# Patient Record
Sex: Male | Born: 1945 | Race: White | Hispanic: No | Marital: Single | Smoking: Never smoker
Health system: Southern US, Community
[De-identification: ages and names within clinical notes are randomized; demographics above are authoritative.]

## PROBLEM LIST (undated history)

## (undated) DIAGNOSIS — E785 Hyperlipidemia, unspecified: Secondary | ICD-10-CM

## (undated) DIAGNOSIS — G629 Polyneuropathy, unspecified: Secondary | ICD-10-CM

## (undated) DIAGNOSIS — G4730 Sleep apnea, unspecified: Secondary | ICD-10-CM

## (undated) DIAGNOSIS — N189 Chronic kidney disease, unspecified: Secondary | ICD-10-CM

## (undated) DIAGNOSIS — G5600 Carpal tunnel syndrome, unspecified upper limb: Secondary | ICD-10-CM

## (undated) DIAGNOSIS — E119 Type 2 diabetes mellitus without complications: Secondary | ICD-10-CM

## (undated) DIAGNOSIS — I639 Cerebral infarction, unspecified: Secondary | ICD-10-CM

## (undated) DIAGNOSIS — M519 Unspecified thoracic, thoracolumbar and lumbosacral intervertebral disc disorder: Secondary | ICD-10-CM

## (undated) DIAGNOSIS — F32A Depression, unspecified: Secondary | ICD-10-CM

## (undated) DIAGNOSIS — I10 Essential (primary) hypertension: Secondary | ICD-10-CM

## (undated) DIAGNOSIS — R609 Edema, unspecified: Secondary | ICD-10-CM

## (undated) DIAGNOSIS — N529 Male erectile dysfunction, unspecified: Secondary | ICD-10-CM

## (undated) DIAGNOSIS — F329 Major depressive disorder, single episode, unspecified: Secondary | ICD-10-CM

## (undated) HISTORY — DX: Hyperlipidemia, unspecified: E78.5

## (undated) HISTORY — DX: Type 2 diabetes mellitus without complications: E11.9

## (undated) HISTORY — DX: Male erectile dysfunction, unspecified: N52.9

## (undated) HISTORY — DX: Edema, unspecified: R60.9

## (undated) HISTORY — DX: Polyneuropathy, unspecified: G62.9

## (undated) HISTORY — DX: Unspecified thoracic, thoracolumbar and lumbosacral intervertebral disc disorder: M51.9

## (undated) HISTORY — DX: Depression, unspecified: F32.A

## (undated) HISTORY — DX: Carpal tunnel syndrome, unspecified upper limb: G56.00

## (undated) HISTORY — DX: Major depressive disorder, single episode, unspecified: F32.9

## (undated) HISTORY — PX: BACK SURGERY: SHX140

## (undated) HISTORY — DX: Chronic kidney disease, unspecified: N18.9

## (undated) HISTORY — PX: VASECTOMY: SHX75

## (undated) HISTORY — DX: Essential (primary) hypertension: I10

## (undated) HISTORY — DX: Sleep apnea, unspecified: G47.30

---

## 1999-04-23 ENCOUNTER — Inpatient Hospital Stay (HOSPITAL_COMMUNITY): Admission: EM | Admit: 1999-04-23 | Discharge: 1999-04-24 | Payer: Self-pay | Admitting: Emergency Medicine

## 1999-04-23 ENCOUNTER — Encounter: Payer: Self-pay | Admitting: Emergency Medicine

## 1999-04-24 ENCOUNTER — Encounter: Payer: Self-pay | Admitting: *Deleted

## 1999-05-03 ENCOUNTER — Ambulatory Visit (HOSPITAL_COMMUNITY): Admission: RE | Admit: 1999-05-03 | Discharge: 1999-05-03 | Payer: Self-pay | Admitting: *Deleted

## 1999-10-12 ENCOUNTER — Emergency Department (HOSPITAL_COMMUNITY): Admission: EM | Admit: 1999-10-12 | Discharge: 1999-10-12 | Payer: Self-pay | Admitting: Emergency Medicine

## 2002-10-07 ENCOUNTER — Encounter: Payer: Self-pay | Admitting: Specialist

## 2002-10-09 ENCOUNTER — Inpatient Hospital Stay (HOSPITAL_COMMUNITY): Admission: RE | Admit: 2002-10-09 | Discharge: 2002-10-16 | Payer: Self-pay | Admitting: Specialist

## 2002-10-09 ENCOUNTER — Encounter: Payer: Self-pay | Admitting: Orthopedic Surgery

## 2002-10-09 ENCOUNTER — Encounter: Payer: Self-pay | Admitting: Specialist

## 2002-10-15 ENCOUNTER — Encounter: Payer: Self-pay | Admitting: Specialist

## 2003-01-13 ENCOUNTER — Encounter
Admission: RE | Admit: 2003-01-13 | Discharge: 2003-04-13 | Payer: Self-pay | Admitting: Physical Medicine and Rehabilitation

## 2003-02-28 HISTORY — PX: BACK SURGERY: SHX140

## 2004-03-05 ENCOUNTER — Emergency Department (HOSPITAL_COMMUNITY): Admission: EM | Admit: 2004-03-05 | Discharge: 2004-03-05 | Payer: Self-pay | Admitting: Emergency Medicine

## 2004-03-08 ENCOUNTER — Encounter: Admission: RE | Admit: 2004-03-08 | Discharge: 2004-03-08 | Payer: Self-pay | Admitting: Specialist

## 2004-04-29 ENCOUNTER — Ambulatory Visit (HOSPITAL_COMMUNITY): Admission: RE | Admit: 2004-04-29 | Discharge: 2004-04-29 | Payer: Self-pay | Admitting: Neurology

## 2004-07-14 ENCOUNTER — Ambulatory Visit: Payer: Self-pay | Admitting: Cardiovascular Disease

## 2004-07-21 ENCOUNTER — Ambulatory Visit: Payer: Self-pay

## 2004-08-17 ENCOUNTER — Ambulatory Visit (HOSPITAL_COMMUNITY): Admission: RE | Admit: 2004-08-17 | Discharge: 2004-08-17 | Payer: Self-pay | Admitting: Internal Medicine

## 2004-08-17 ENCOUNTER — Ambulatory Visit: Payer: Self-pay | Admitting: Internal Medicine

## 2006-06-07 ENCOUNTER — Ambulatory Visit: Payer: Self-pay | Admitting: Pulmonary Disease

## 2006-07-09 ENCOUNTER — Ambulatory Visit (HOSPITAL_BASED_OUTPATIENT_CLINIC_OR_DEPARTMENT_OTHER): Admission: RE | Admit: 2006-07-09 | Discharge: 2006-07-09 | Payer: Self-pay | Admitting: Pulmonary Disease

## 2006-07-17 ENCOUNTER — Ambulatory Visit: Payer: Self-pay | Admitting: Pulmonary Disease

## 2006-07-25 ENCOUNTER — Ambulatory Visit: Payer: Self-pay | Admitting: Pulmonary Disease

## 2006-08-24 ENCOUNTER — Ambulatory Visit: Payer: Self-pay | Admitting: Pulmonary Disease

## 2007-10-07 ENCOUNTER — Ambulatory Visit: Payer: Self-pay

## 2008-08-11 ENCOUNTER — Encounter: Admission: RE | Admit: 2008-08-11 | Discharge: 2008-08-11 | Payer: Self-pay | Admitting: Specialist

## 2010-07-12 NOTE — Assessment & Plan Note (Signed)
[facility]                             PULMONARY OFFICE NOTE   NAME:Paul Adkins, Paul Adkins                        MRN:          [id number]  DATE:07/25/2006                            DOB:          September 14, 1945    SUBJECTIVE:  Paul Adkins comes in today for follow up after his recent  sleep study. He was found to have a respiratory disturbance index of 38  events per hour and an O2 saturation as low as 77%. He did have  difficulty with sleeping during the study at only 2.7 hours and  therefore I suspect that the severity of his sleep apnea is worse than  what is indicated. I have discussed the study at great length with him  and have answered all of his questions.   PHYSICAL EXAMINATION:  GENERAL:  He is an overweight male in no acute  distress.  VITAL SIGNS:  Blood pressure 112/68, pulse 56, temperature 98, weight  226 pounds, O2 saturation on room air is 97%.   IMPRESSION:  1. Severe obstructive sleep apnea/hypopnea syndrome documented by      nocturnal polysomnography. I have discussed the various options      with him including weight loss, oral appliance, surgery, as well as      CPAP. Given this degree of sleep apnea, I really think CPAP coupled      with weight loss would be in his best interested. I have re-      discussed with him all of the cardiovascular benefits, as well as      the benefits of improved glucose control with treatment of sleep      apnea. The patient is willing to give CPAP a try.   PLAN:  1. Initiate CPAP starting at 10 cm with heated humidification.  2. Work on weight loss.  3. The patient will follow up in 4 weeks or sooner if there are      problems. We will work on pressure optimization at that time.     Barbaraann Share, MD,FCCP  Electronically Signed    [facility]  DD: 07/25/2006  DT: 07/26/2006  Job #: [id number]   cc:   Reather Littler, M.D.

## 2010-07-12 NOTE — Procedures (Signed)
NAMEJuel Adkins, Paul Adkins               ACCOUNT NO.:  [id number]   MEDICAL RECORD NO.:  [id number]          PATIENT TYPE:  OUT   LOCATION:  SLEEP CENTER                 FACILITY:  [facility]   PHYSICIAN:  Barbaraann Share, MD,FCCPDATE OF BIRTH:  04-02-47   DATE OF STUDY:  07/09/2006                            NOCTURNAL POLYSOMNOGRAM   REFERRING PHYSICIAN:   REFERRING PHYSICIAN:  Dr.  Marcelyn Bruins.   LOCATION:  Sleep lab.   INDICATION FOR STUDY:  Hypersomnia with sleep apnea.   EPWORTH SLEEPINESS SCORE:  12   MEDICATIONS:   SLEEP ARCHITECTURE:  The patient had a total sleep time of only 160  minutes with no slow wave sleep and only 10 minutes of REM.  Sleep onset  latency was prolonged at 77 minutes, and REM onset was prolonged as well  with 113 minutes.  Sleep efficiency was very decreased at 39%.   RESPIRATORY DATA:  The patient was found to have 64 hypopneas, 36  obstructive apneas and 2 central apneas for apnea hypopnea index of 38  events per hour.  The events occurred in all body positions and there  was moderate snoring noted throughout.   OXYGEN DATA:  His O2 saturation is as low as 77% with the patient's  obstructive events.   CARDIAC DATA:  No clinically significant cardiac arrhythmias were noted.   MOVEMENT-PARASOMNIA:  The patient was found to have 89 leg jerks with  less than 1 per hour resulting in arousing or awakening.   IMPRESSIONS-RECOMMENDATIONS:  Moderate to severe obstructive sleep  apnea/hypopnea syndrome with an apnea/hypopnea index of 38 events per  hour and O2 desaturation as low as 77%.  Treatment for this degree of  sleep apnea should primarily focus on weight loss if applicable as well  as CPAP.      Barbaraann Share, MD,FCCP  Diplomate, American Board of Sleep  Medicine  Electronically Signed     [facility]  D:  07/19/2006 08:25:44  T:  07/19/2006 09:01:08  Job:  [id number]

## 2010-07-15 NOTE — Discharge Summary (Signed)
Paul Adkins, Paul Adkins                           ACCOUNT NO.:  [id number]   MEDICAL RECORD NO.:  [id number]                   PATIENT TYPE:  INP   LOCATION:  5008                                 FACILITY:  [facility]   PHYSICIAN:  Kerrin Champagne, M.D.                DATE OF BIRTH:  December 27, 1945   DATE OF ADMISSION:  10/09/2002  DATE OF DISCHARGE:  10/16/2002                                 DISCHARGE SUMMARY   ADMISSION DIAGNOSES:  1. Spondylolisthesis L5-S1, isthmic type with bilateral pars defect,     bilateral foraminal entrapment of L5 nerve root, degenerative disk     disease L4-5.  2. Insulin-dependent diabetes mellitus utilizing insulin pump.  3. Hypertension.  4. Hyperlipidemia.  5. Depression and anxiety, mild, treated with antidepressants.   DISCHARGE DIAGNOSES:  1. Spondylolisthesis L5-S1, isthmic type, with bilateral  pars defect,     bilateral foraminal entrapment of L5 nerve root, degenerative disk     disease L4-5.  2. Small dural bleb tear, right L5 nerve root, treated intraoperatively with     suture and fibrin glue.  3. Insulin-dependent diabetes mellitus treated with insulin pump.  4. Hypertension.  5. Hyperlipidemia.  6. Depression and anxiety, mild, treated with antidepressants.  7. Postoperative ileus, cleared at discharge.  8. Posthemorrhagic anemia requiring blood transfusions.  9. Hypocalcemia, resolved at discharge.  10.      Hypokalemia, resolved at discharge.  11.      Preoperative chest x-ray with nodule noted, requiring CT scan of     the chest.  CT scan negative for mass or consolidation.   PROCEDURES:  On October 09, 2002, the patient underwent central laminectomy  L5-S1 with bilateral foraminal decompression of the L5 nerve roots, right-  sided L4-5 hemilaminectomy with excision of the spinous process of L4.  Right L4-5 and L5-S1 transforaminal interbody fusion utilizing Brantigan  radiolucent cages.  Posterolateral fusion L4-5 and L5-S1 with  Monarch  pedicle screws and rods, right iliac crest bone graft harvested through  separate fascial incision.  Symphony bone graft utilized with platelet-rich  plasma, repair of dural bleb right L4-5 at the shoulder of the right L-5  nerve root, 2 to 3 mm bleb with three 6-0 Prolene sutures and fibrin glue.  This was performed by Dr. Otelia Sergeant, assisted by Wende Neighbors, P.A.-C. under  general anesthesia .   CONSULTATIONS:  Ricki Rodriguez, M.D.   BRIEF HISTORY:  Paul Adkins is a 65 -year-old white male with insulin-  dependent diabetes mellitus treated and stable with insulin pump for a  number of years.  The patient had notably increasing neurogenic claudication  over the past one year.  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  The patient has failed  conservative management with epidural steroid injections as well as rest.  He is requiring  narcotic medications on a regular basis at this time.  Studies have indicated the patient has degenerative disk disease at L4-5  with grade I slip at the L5-S1 level as well as bilateral foraminal  entrapment of the L5 nerve roots.  It was felt he would require surgical  intervention for definitive treatment of his finding on MRI.  He was  admitted to undergo procedure as stated above.   HOSPITAL COURSE:  The patient tolerated the procedure under general  anesthesia  without complications.  He did require repair of dural bleb  intraoperatively; however, with Valsalva maneuver, he did not have any  leaking about the dural bleb intraoperatively.  The patient was transferred  to intensive care unit following the procedure to have close medical  observation with his significant past medical history.  Hemoglobin did drop  intraoperatively to 6.8, and he was transfused.  Further drop in hemoglobin  was to 8.1.  He did receive a total of 4 units of packed red blood cells  during the hospital stay.   Last hemoglobin on October 13, 2002, was noted to  be 10.2 with hematocrit 29.4 and stable. Neurovascular and motor functions  of the lower extremities noted to be intact on the first postoperative day.  The patient was placed on the Glucomander while in the unit to manage  diabetes.   Unfortunately on the first postoperative day, the patient began developing  signs and symptoms of postop ileus.  He had abdominal distention as well as  hypoactive bowel sounds and nausea.  Later on the first postoperative day,  he did require placement of an NG tube.  He was also noted to have  significant hypocalcemia and was given 1 amp of calcium gluconate.  The  patient initially was treated with PCA Dilaudid for pain control.  He was  also receiving muscle relaxers and oral analgesics to assist with pain  control.  It did take several days before he was able to be weaned from the  IV medication to oral medication.   Diabetes was monitored by Dr. Algie Coffer.  He gradually was weaned back onto  his insulin pump once he was stabilized.  With pain control, he was able to  start taking a regular diet secondary to his ileus.  It did take some time  to resolve his ileus as he was noted to be hypokalemic as well.  He was  treated with oral supplementation.  Hypocalcemia continued to improve  throughout the hospital stay.   On august 16, he was noted to have had several bowel movements and was  having less abdominal distention as well as positive bowel sounds.  His IV  fluids were discontinue as was his Foley catheter.  Telemetry was  discontinued, and he was transferred to the orthopedic floor at that time.   The patient continued to have some mild difficulty with hypoglycemia and did  require several adjustments in his insulin pump to regain adequate control.  His base rate was changed accordingly by Dr. Algie Coffer.  The patient's diet was slowly restarted as the ileus cleared; however, he continued to have   difficulty with constipation throughout the hospital stay.  Eventually with  a regular regimen of laxatives and stool softeners, he was able to start  having regular bowel movement and was taking his diet a little better.   Physical therapy was started once the patient was transferred out of the  intensive care unit.  While he was in the intensive  care unit, he actually  did begin some physical therapy with bed-to-chair transfers as well as  donning and doffing his brace.  Once he was more stable, he began ambulating  in the hallway.  His neurovascular and motor functions remained intact in  the lower extremities throughout the hospital stay.  The patient significant  relief of his leg pain after the procedure; however, he continued to have  low back pain as expected.  Oral medications were eventually changed to  OxyContin and OxyIR, and this did give him adequate pain control.   It was noted that his preop chest x-ray did find a possible nodule, and a  followup CT was recommended.  The CT scan was performed, and no mass or  consolidation was noted.  Atelectasis was noted on his CT scan.  The patient  had been treated with pulmonary toilet throughout the hospital stay.  The  patient's Hemovac drain was discontinued on the second postoperative day  from his wound, and dressing changes were done daily thereafter.  He did  have good wound healing throughout the hospital stay without any signs or  symptoms of infection.   On October 16, 2002, the patient was felt to be stable for discharge to his  home with continued care by his family.  Arrangements were also made for  home health physical therapy, occupational therapy, and bath aide.   CONDITION ON DISCHARGE:  Stable.   LABORATORY DATA:  Pertinent laboratory values, admission CBC with hemoglobin  12.5, hematocrit 35.8.  Hemoglobin dropped to 6.6 on the first postoperative  day, and patient received 2 units of packed red blood cells.  The  values did  increase to above 9.0 on hemoglobin and 27 on hematocrit.  The patient had  another drop in hemoglobin on August 15 to 6.8 at which time he did receive  another 2 units of packed red blood cells, and his hemoglobin and hematocrit  stabilized at 10.2 and 29.4.   Chemistry studies on admission were  within normal limits.  Postoperatively,  chemistry studies were monitored daily.  He did have two days of hypokalemia  which was treated with oral supplementation and corrected to a normal value.  The patient was noted to have hypokalemia. After treatment with IV calcium  gluconate, the patient was noted to have increase in his values which  contained to increase daily.  Glucose values were noted to be abnormal on  his routine chemistry studies; however, they were monitored closely at the  bedside by CBG values.  While on the Glucomander, he had excellent control. However, once trying to wean back onto the insulin pump, he had some  difficulty maintaining normal values.  Adjustments were made per Dr.  Roseanne Kaufman instructions, and prior to discharge, blood sugars has stabilized.  Urinalysis on admission was negative for urinary tract infection.  Blood  gases were check on august 12, and initial values PO2 249, and a repeat  later that day showed PO2 94, bicarb 18, pH 7.235.   CT of the abdomen was performed on October 09, 2002, as the patient had a  significant drop in his hemoglobin, and retroperitoneal bleed needed to be  ruled out.  Impression of the CT scan was that of postoperative changes of  the lumbar spine without evidence or retroperitoneal hemorrhage.   CT of the chest was with bibasilar atelectasis.   EKG on admission: Normal sinus rhythm confirmed by Dr. Daleen Squibb.  A repeat on  august 12 showed  normal sinus rhythm confirmed by Dr. [place].   PLAN:  The patient was discharged to his home with arrangements to have  assistance by home health care by [place].  He will received  physical  therapy as well as occupational therapy for ambulation and gait training.  He will continue to wear his brace when out of bed but is not required to  wear the brace while at rest.  He will avoid lifting over 10 pounds and  avoid bending or twisting at the waist.  He will begin a walking program and  try to walk as much as one mile per day.  The patient will change his  dressing daily at home.  He will be allowed to shower at the time of  discharge.  The patient will call Dr. Barbaraann Faster office to arrange an  appointment for two weeks postop.   DISCHARGE MEDICATIONS:  Prescriptions given to him include:  1. Robaxin 500 mg 1 every 6 to 8 hours as needed for spasm.  2. OxyContin 20 mg 1 p.o. b.i.d.  3. OxyIR 1 every 4 to 6 hours as needed for pain.  4. Reglan 10 mg 1 p.o. daily with meals.  5. The patient will take over-the-counter baby aspirin daily.  6. Multivitamins daily.  7. He is advised to use Senokot over-the-counter as well.  8. He may use laxative or enema of choice as needed.   DIET:  He will continue on a his regular diet for diabetes and is encouraged  to have plenty of liquids.   Patient and family have been advised to call for questions or concerns prior  to his return office visit.   CONDITION ON DISCHARGE:  Stable.     Wende Neighbors, P.A.                    Kerrin Champagne, M.D.    [facility]  D:  12/17/2002  T:  12/17/2002  Job:  [id number]

## 2010-07-15 NOTE — Assessment & Plan Note (Signed)
REASON FOR VISIT:  Paul Adkins has returned to the Pain and Rehabilitative  Medicine clinic.  He is status post a transforaminal inner body fusion done  October 09, 2002 by Dr. Otelia Sergeant.  He initially came to the clinic taking 40 mg  a day of OxyContin.  He had attempted to wean himself and had some  withdrawal symptoms earlier that week.  He was given a prescription for  OxyContin 10 mg b.i.d. and he is back in today saying he has done quite well  on that.  He was also given some medication for breakthrough problems and he  reports he did not need to take any immediate release OxyContin.   Overall he is doing very well.  He is working.  He has had no problems with  any kind of withdrawal symptoms with reduction of his pain medication last  time.  He reports his bowels have improved.  He denies any new problems of  numbness, tingling, weakness, and he denied any new problems with back pain.  He reports he is doing quite well.   On exam he is able to get out of the chair easily.  He walks in the room  without any difficulty.  He is able to walk with a brisk pace, walks on  heels and toes easily.  Neurologic exam is unchanged from last visit.  His  reflexes are symmetric and intact in the lower extremities.  Sensation is  intact.  Motor strength is 5/5 in lower extremities.  Straight leg raise is  negative.   IMPRESSION:  Status post transforaminal inner body fusion August 2004.  The  patient had taken 40 mg of OxyContin a day.  He is down to 20 mg a day.  Today we will give him a prescription for 10 mg per day.  He has not used  any of his immediate release OxyContin which he can use as needed one to two  per day.  However, I anticipate he probably will not need this at all.  We  will see him back in two weeks.  He will call if there is any trouble and  anticipate he will be off all of his OxyContin at the next visit.  Will see  him back in two weeks.      Brantley Stage,  M.D.   [facility]  D:  01/28/2003 12:57:07  T:  01/28/2003 13:42:19  Job #:  [id number]   cc:   Kerrin Champagne, M.D.  [address]  [place]  [place] [address]  Fax: [phone]

## 2010-07-15 NOTE — Discharge Summary (Signed)
[place]  Patient:    Paul Adkins, Paul Adkins                        MRN: [id number] Adm. Date:  [id number] Disc. Date: 04/24/99 Attending:  Veneda Melter Dictator:   Leonides Cave, P.A. CC:         Veneda Melter, M.D., Cardiology             Reather Littler, M.D., Endocrinology                           Discharge Summary  DISCHARGE DIAGNOSES: 1. Syncope, unclear etiology.  Patient with negative GXD Cardiolite and    negative cardiac enzymes. 2. Insulin-dependent diabetes x 25 years. 3. 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 4. Anxiety.  Patient started on Buspar within the last month.  BRIEF HISTORY:  [name] without history of any coronary artery disease.   Patient has a fairly benign past medical history other than insulin-dependent diabetes mellitus x 25 years and as stated above. Was eating lunch around 12:45 on the day of admission with his daughter when he developed left upper extremity "numbness" and neck discomfort.  Symptoms lasted a little over a minute, then he had a frank syncopal episode.  No one was able to  give a duration of his time of loss of consciousness.  When the EMS arrived, patient was pale and had difficulty speaking, and had a generally clouded sensorium.  Patients blood pressure was 70/40 per EMS, and his blood glucose was in the "90s".   Patient denies chest pain or shortness of breath whatsoever. He did have a similar episode of the left upper extremity and neck numbness three r four days prior to this admission while in [place] on business.  Patient states his symptoms resolved shortly after a minute or two in [place].  Patient was sent emergently to CAT scan when he arrived to the emergency department and CAT was negative for aortic dissection.   When seen by the cardiology team in the emergency department, patient was asymptomatic with systolic blood pressure in the  140s and heart rate in the 90s.   Patient was admitted by Leonides Cave, P.A. and Dr. Veneda Melter to rule out for myocardial infarction and to assess causes of syncope.  HOSPITAL COURSE:  Patient had negative cardiac enzymes and was set up for a GXD  Cardiolite on 04/24/99.   Cardiolite results were EF 64%, no ischemia, and normal wall function.   The patient had no arrhythmia on the monitor and it was felt that patient could be discharged home in stable condition.  He was told he can continue his home medications, which include his insulin, which he gets through his insulin pump and Buspar 15 mg b.i.d.   Patient states that he has declined taking the Buspar since that is a recent new medication that was introduced to him and I told him that this was reasonable.  His activity will be as tolerated and he will have some driving restrictions per Dr. Chales Abrahams.  He was to continue a strict diabetic iet and he was told that next week in the [facility] cardiology office, he will need a  carotid ultrasound and a two-week Holter monitor.  He will see Dr. Veneda Melter n follow-up after the above is done.  He will call Monday to make this  appointment. He can follow up with his primary care physician, Dr. Lucianne Muss, on a p.r.n. basis. Of note, he is scheduled to have an appointment with Dr. Lucianne Muss in the morning, 04/25/99. DD:  04/24/99 TD:  04/24/99 Job: [id number]

## 2010-07-15 NOTE — Op Note (Signed)
NAME:  Adkins, Paul                           ACCOUNT NO.:  [id number]   MEDICAL RECORD NO.:  [id number]                   PATIENT TYPE:  INP   LOCATION:  2550                                 FACILITY:  [facility]   PHYSICIAN:  Kerrin Champagne, M.D.                DATE OF BIRTH:  [birth year]   DATE OF PROCEDURE:  10/09/2002  DATE OF DISCHARGE:                                 OPERATIVE REPORT   PREOPERATIVE DIAGNOSIS:  Spondylolisthesis L5-S1, isthmic type, with  bilateral pars defects, bilateral foraminal entrapment of the L5 nerve  roots, degenerative disc disease L4-L5 central disc tear.   POSTOPERATIVE DIAGNOSIS:  Spondylolisthesis L5-S1, isthmic type, with  bilateral pars defects, bilateral foraminal entrapment of the L5 nerve  roots, degenerative disc disease L4-L5 central disc tear.   PROCEDURE:  Central laminectomy L5-S1 with bilateral foraminal decompression  of the L5 nerve roots, right sided L4-L5 hemilaminectomy with excision of  the spinous process of L4.  Right sided L4-L5 and L5-S1 transforaminal  interbody fusion performed using two 10 mm DePuy Brantigan radiolucent  cages.  Posterolateral fusion L4-L5 and L5-S1 with Monarch pedicle screws  and rods, right iliac crest bone graft harvested through a separate fascial  incision, Symphony bone graft with platelet rich plasma.  Repair of dural  bleb right L4-L5 at the shoulder of the right L5 nerve root, 2-3 mm bleb  with three 6-0 Prolene sutures and fibrin glue.   SURGEON:  Kerrin Champagne, M.D.   ASSISTANT:  Wende Neighbors, P.A.   ANESTHESIA:  GOT, Dr. Jean Rosenthal   DRAINS:  Hemovac x 1, Foley catheter to straight drain.   COMPLICATIONS:  Small dural bleb tear right shoulder L5 nerve root at the L4-  L5 level.   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  He  presents with primarily right leg pain greater than left leg pain, pain with  standing and walking.  This is becoming increasingly severe to the point  where he requires narcotic medications.  He has failed conservative  management with epidural steroid injections and rest.  He is brought to the  operating room to undergo decompressive laminectomy for bilateral foraminal  stenosis of L5-S1 associated with ischemic spondylolisthesis which is grade  1 and extension of the fusion to the L4-L5 level because of degenerative  disc changes here with paracentral disc damage or tear.   OPERATIVE FINDINGS:  Degenerative disc changes of L4-L5, grade 1 slip at the  L5-S1 level, bilateral foraminal entrapment of the L5 nerve roots.  The  posterior neural arch of L5 was quite loose and was resected uneventfully.   DESCRIPTION OF PROCEDURE:  After adequate general anesthesia, the patient in  a prone position and chest rolls placed, TED hose to prevent DVT.  Standard  preoperative antibiotics.  All pressure points were well padded.  Loupe  magnification and headlamp was used at the beginning of the procedure.  Standard prep with DuraPrep solution, draped in the usual manner, Ioban  drape used.  The incision in the midline extending from about S2 to L2  through the skin and subcutaneous layers down to the lumbodorsal fascia.  This was incised on both sides of the expected L3, L4, L5, and S1 levels.  Cobb was then used to elevate the paralumbar muscles bilaterally off the  posterior aspect of the lamina of S1, L5, L4, and the L3 level.  Exposure  further obtained using McCullough retractors.  Bleeders controlled using  electrocautery.  Interoperative radiograph with clamps at the spinous  process of L5 and L4 demonstrating these to be the said levels.  Further  exposure then obtained excising the facet capsules  at L4-L5 and L5-S1  carrying the dissection out to the S1 ala bilaterally and to the transverse  process of L4 bilaterally, preserving the capsule at L3-L4 but exposing out  to the transverse process of L4 on both sides.  These were each packed,  bleeders controlled using bipolar and monopolar electrocautery.   With the [facility] retractor inserted, Leksell rongeur used to excise the  central portions of the lamina at the spinous process of L5 and L4.  The  lamina resected bilaterally also using Leksell rongeurs as well as 4 and 5  mm Kerrisons.  The medial facet bilaterally at L5-S1 also resected,  ligamentum flavum removed off the superior attachment to the S1 lamina  bilaterally in midline, as well.  Foraminotomy performed over bilateral S1  nerve roots.  Loose areas of pars defects were resected totally and a  lateral recess decompression carried out at the L4-L5 level carried out the  L5 neural foramen performing foraminotomy over the L5 nerve roots both sides  resecting the superior portion of the S1 articular process as well as any  residual pars in the area of scar tissue associated with attempt to repair  pars defect.  This decompressed both L5 nerve roots adequately as well as S1  bilaterally.   C-arm fluoroscopy was brought onto the field.  An awl was used to make a  small opening into the cortex at the level of the inferior aspect of the  superior articular process of S1 on the left side laterally placed and a  blunt tip pedicle finder then inserted with angulation of sacral inclination  of about 30-40 degrees and with convergence of almost 40 degrees.  This  provided a length of about 45 mm on the left side.  Left L5 pedicle was also  probed at the mid portion of the transverse process and intersection with  the supra-articular process of L5.  An awl was used to first initiate the  opening of the cortex and then a blunt tip straight pedicle finder used to probe the pedicle  here.  Similarly, this was don eon the left side at L4.  These were each carefully probed with a ball tip probe to insure no cortical  penetration.  Measured for depth, 45 mm screw was also used at L5 on the  left.  Tapping was performed.  Bone graft harvested from the right iliac  crest through a separate fascial incision, subperiosteal dissection both  medial and lateral,  and a curved osteotome used to remove cortical  cancellous bone strips, gouges used to remove inner table bone.  Hemostasis  obtained using Gelfoam placed.  This was morselized, Symphony graft added to  extra bone graft obtained from the central laminotomy area at L4 and L5  combined with some bone material from the right iliac crest.  This was used  to make bone material and Symphony logs using allograft bone material, as  well, to augment.  Decortication then performed over the left sacral ala,  S1, transverse process of L5 and transverse process of L4.  The Symphony  bone graft log was then placed out after decortication and then the screws  inserted at S1 45 mm by 7.2 on the left.  Then, a 45 mm by 7.2 at the L5  level and then a 6.5 inserted at the L4 level on the left side 40 mm in  length.  Screws were then inserted on the right side, similarly, exposing  the transverse process of L4, L5, and S1.  Decorticating these, finding the  pedicles at each level using pedicle finders and then inserted screw on the  right side.  At the S1 level, a 40 mm screw was inserted.  Also, on the  right side at the L5 level, a 40 mm screw was inserted, both 7.2 in width  and a 6.5 mm length inserted on the right side at the L4 level.   Using the  [employer] soft tissue resistance check, a check was made  on each of the pedicle screws, each measuring greater than 40 milliamps  resistance with the exception of the right S1 screw which demonstrated a  resistance of 16 milliamps which is within normal limits.  Irrigation was   performed.  75 mm length rods were then inserted into the screw cap heads  following the breakage using the screw head fastener breakers to allow for  mobility of these.  The rods were inserted, caps inserted.  The screw at the  S1 level was then carefully tightened to 100 foot pounds on the right hand  side.  Distraction obtained between the S1 and the L5 screw and the screw at  L5 then carefully tightened to fasten the rod to the screw head.  TLIF was  then performed on the right side at the L5-S1 level, carefully cauterizing  epidural veins overlying the disc on the right side protecting the thecal  sac with a retractor.  A 15 blade scalpel was used to incise disc on the  right side removing a window of disc material.  Curettage was then performed  of the disc space and the endplates using the appropriate size curets, the  angled curets, also using the ringed curets.  This was done along with an  osteotome used to excise lip osteophyte over the posterior aspect of the S1 level superiorly.  The disc space was very lightly debrided of cartilaginous  endplate material.  Irrigation was performed in the disc space.  Bone graft  was obtained that was cancellous was then used to pack 10 mm Brantigan  radiolucent TLIF cage after first sounding up to a 10 mm size cage.  Sounding on the C-arm fluoroscopy demonstrated this to be an excellent  contact between the endplates at the L5-S1 level.  A 10 mm cage packed was  then impacted into place, placed across the midline, and rotated into the  anterior aspect of the L5-S1 disc space after first pacing the  anterior  portion of the disc with further Symphony bone as well as cancellous bone.  Excess bone was then removed from the spinal canal as well as the neural  foramen with care taken to insure that the foramen was completely free and  there was no residual bone remaining following insertion of the TLIF  Brantigan cage.  The screws were then loosened  at the L5 level and then  compression obtained across this level using compressor device and then the  screw tightened at the L5 level to 100 foot pounds on the right side.  The  left side was then tightened at the S1 level and compression obtained across  the L5-S1 level and the L5 screw then tightened to 100 foot pounds.  Distraction was then obtained on the right side between the L4 and L5  pedicle screws.  Again, TLIF procedure repeated on the right side at L4-L5,  first carefully controlling epidural bleeders using bipolar electrocautery  along the right side, freeing up the L4 nerve root thecal sac medially.  A  window of posterior disc excised on the right side, 0.5 cm by 4 mm excised.  Then, matching of the disc space performed using curettage, pituitary  rongeurs, ring curets, until the disc was debrided of nucleus pulposus as  well as cartilaginous endplates.  Curettage performed.  Irrigation performed  of the disc space with care taken to insure that all disc material had been  excised.  With this, then bone graft was placed into the disc space at the  L4-L5 level using the Symphony bone graft material as well as some  cancellous bone graft material, sounding performed up to a 10 mm TLIF  Brantigan cage.  This Brantigan cage was then packed with bone graft  material with cancellous bone graft of the right iliac crest, alone.  This  was impacted into place without difficulty.  Then, compression obtained  across the disc space following kicking of the cage into the appropriate  position and alignment transversely oriented within the central portion of  the disc at the L4-L5 level.  Compression obtained across the screws  following loosening of the upper screw.  Then, compressing and tightening  the upper L4 screw to 100 foot pounds.  With this, compression was obtained  across both grafts, excellent fixation was obtained.  Care was taken to  insure that there was no bone material  remaining within the spinal canal on the right side at the L4-L5 level of the L5-S1 level.  Hockey stick neural  probe could be passed out the neural foramen at L4 above and below the nerve  root, both anterior and posterior to the nerve root.  Also, at the L5 level  bilaterally and the S1 level bilaterally.  Irrigation was performed.   Careful inspection of the thecal sac demonstrated a small bleb on the right  side just at the shoulder of the L5 nerve root at the level of the L4-L5  disc space.  This was noted following the insertion of the TLIF graft on the  right side.  This represented a bleb through the thecal sac, most  superficial level dura  matter, but not within the arachnoid layer.  6-0  Prolene suture interrupted, three sutures in total, was used to close this  small bleb.  There was no CSF leak noted during this portion of the  procedure, but fibrin glue was used to additionally reinforce the repair.  Irrigation was performed.  Careful  inspection of the bone graft sites of the  posterolateral fusion areas showed that there was some need for some further  bone grafting.  Bone graft that had been harvested from the right iliac  crest was additionally added to the areas where Symphony graft had  previously been placed stemming from L4 to the sacrum on both sides.  The  platelet rich plasma was then sprayed over the open wound posteriorly and  over the right iliac crest bone graft harvest site.  The right iliac bone  graft harvest site closed with a running stitch of #1 Vicryl.  The  lumbodorsal fascia was reattached to the spinous process of L3 and to the  spinous process of S1.  The paralumbar muscles were carefully reapproximated  loosely using interrupted #1 Vicryl sutures over a medium Hemovac drain.  There was no drainage from the previous CSF area or from the previous dural  leak area.  Next, the lumbodorsal fascia was reapproximated in the midline  with interrupted #1  Vicryl sutures.  The deep subcu layers were  reapproximated with interrupted #1 and 0 Vicryl sutures, the more  superficial layers with interrupted 2-0 Vicryl sutures, and the skin was  closed with running subcu stitch of Vicryl.  Tincture of Benzoin and Steri-  Strips applied.  4 by 4s, ABD pad, with  elastic tape.  The Hemovac drain charged.  The patient was then reactivated  following being turned to a supine position.  He was then extubated and  returned to the recovery room in satisfactory condition.  All instrument and  sponge counts were correct.                                               Kerrin Champagne, M.D.    JEN/MEDQ  D:  10/09/2002  T:  10/09/2002  Job:  [id number]

## 2010-07-15 NOTE — Op Note (Signed)
NAMEChriston Adkins, Elohim               ACCOUNT NO.:  [id number]   MEDICAL RECORD NO.:  [id number]          PATIENT TYPE:  OIB   LOCATION:  2854                         FACILITY:  [facility]   PHYSICIAN:  Doylene Canning. Ladona Ridgel, M.D.  DATE OF BIRTH:  07-14-1945   DATE OF PROCEDURE:  08/17/2004  DATE OF DISCHARGE:                                 OPERATIVE REPORT   PROCEDURE PERFORMED:  Head-up tilt table testing.   INDICATIONS:  Recurrent unexplained syncope.   INTRODUCTION:  The patient is a very pleasant 65 year old man with a history  of recurrent syncope.  His first episode occurred approximately five years  ago and his most recent one approximately one month ago.  The patient has a  history of diabetes.  He has no known structural heart disease with no  coronary disease and normal LV systolic function.  His EKG is normal.  He is  now referred for head-up tilt table testing.   PROCEDURE:  After informed consent was obtained,l the patient was taken to  the diagnostic EP lab in a fasting state.  After the usual preparation, he was placed in the supine position.  Cardiac  monitoring was carried out.  His initial blood pressure was in the 140 range  with a heart rate in the 60s.  He was placed in the 70-degree supine  position and the blood pressure initially remained stable.  At three minutes  into tilting, his blood pressure dropped from the 130s to the 110-115 range.  His heart rate did not significantly change.  At eight minutes into tilting,  his blood pressure continued to drop, initially from the 110 range down into  the 80 range systolic.  His heart rate at the same time decreased from the  70s down into the low 40s.  He complained of feeling badly and then suddenly  lost consciousness.  He was immediately returned to the supine position.  His blood pressure on recheck one minute after loss of consciousness in the  supine position with the patient now awake was 109/60 and the pulse was back  in the 50s.  For a period of several minutes he felt very poorly.  He was  returned to his room in satisfactory condition.   COMPLICATIONS:  There were no immediate procedure complications.   RESULTS:  This study demonstrates a positive head-up tilt table test with  both evidence of vasodepression as well as cardiac inhibition. The patient  will be begun on a low-dose beta-blocker and instructed on the importance of  a high-sodium diet.  Additional recommendations will follow as required.       GWT/MEDQ  D:  08/17/2004  T:  08/17/2004  Job:  [id number]   cc:   Charlton Haws, M.D.   Reather Littler, M.D.  [address]  [place]  [place] [address]  Fax: [phone]

## 2010-07-15 NOTE — Assessment & Plan Note (Signed)
[facility]                             PULMONARY OFFICE NOTE   NAME:Paul Adkins, Paul Adkins                        MRN:          [id number]  DATE:06/07/2006                            DOB:          April 19, 1945    HISTORY OF PRESENT ILLNESS:  The patient is a very pleasant 65 year old  gentleman who I have been asked to see for possible sleep apnea.  The  patient has been told by his wife that he has loud snoring and pauses in  his breathing during sleep.  He has also noted choking arousals at  times.  He typically goes to bed about 9 p.m. and gets up between 6:30  and 7:00 to start his day.  He does not fell that he is completely  rested upon arising.  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  He does have a Epworth sleepiness score of 11.  However, on weekends he states that he does become sleepy with periods  of inactivity and will doze quite easily in the evenings with television  or movies.  Of note, his weight is neutral over the last 2 years.   PAST MEDICAL HISTORY:  1. Hypertension.  2. History of diabetes.  3. History of CVA 2 years ago.  4. History of spine surgery in 2004.   CURRENT MEDICATIONS:  1. Lipitor 20 mg daily.  2. Metformin 500 mg daily.  3. Aspirin 81 mg daily.  4. Lisinopril 40 mg daily.  5. Lexapro 10 mg daily.  6. Paroxetine 10 mg daily.  7. Testosterone patch as directed.  8. Insulin pump.   The patient has no known drug allergies.   SOCIAL HISTORY:  He is married with children.  He is a Teacher, English as a foreign language at [employer].  He has never smoked.   FAMILY HISTORY:  Remarkable for father and brother having prostate  cancer in the past.   REVIEW OF SYSTEMS:  As per history of present illness.  Also see patient  intake form documented on the chart.   PHYSICAL EXAMINATION:  GENERAL:  He is a overweight white male in no  acute  distress.  VITAL SIGNS:  Blood pressure 128/74, pulse 72, temperature is 98, weight  is 229 pounds, he is 5 feet 7 inches tall, O2 saturation on room air is  98%.  HEENT:  Pupils equal, round and reactive to light and accommodation,  extra ocular muscles are intact, ears show mild deviation to the left,  patent on the right, oropharynx does show significant elongation of the  soft pallet and uvula.  NECK:  Supple without jugular venous and lymphadenopathy, there is no  palpable thyromegaly.  CHEST:  Totally clear.  CARDIAC:  Reveals regular rate and rhythm, no murmurs, rubs or gallops.  ABDOMEN:  Soft and nontender with good bowel sounds.  GENITAL/RECTAL/BREAST:  Not done and not indicated.  LOWER EXTREMITIES:  Showed trace edema, pulses are intact distally.  NEUROLOGICALLY:  Alert and oriented with no obvious motor  deficits.   IMPRESSION:  Probable obstructive sleep apnea.  The patient gives a very  good history for this and certainly he is overweight and has abnormal  upper airway anatomy.  Given his comorbid conditions, I think it is  essential that we know whether he has sleep apnea or not and to what  severity.  I have had a long discussion with him about the path of  physiology of sleep apnea and its impact on his cardiovascular health.   PLAN:  1. Work on weight loss.  2. Schedule for nocturnal polysomnogram.  3. The patient will follow up after the above.     Barbaraann Share, MD,FCCP  Electronically Signed    [facility]/MedQ  DD: 06/07/2006  DT: 06/07/2006  Job #: [id number]   cc:   Reather Littler, M.D.

## 2011-07-10 ENCOUNTER — Encounter (INDEPENDENT_AMBULATORY_CARE_PROVIDER_SITE_OTHER): Payer: BC Managed Care – PPO | Admitting: Ophthalmology

## 2011-07-10 DIAGNOSIS — E1139 Type 2 diabetes mellitus with other diabetic ophthalmic complication: Secondary | ICD-10-CM

## 2011-07-10 DIAGNOSIS — E11319 Type 2 diabetes mellitus with unspecified diabetic retinopathy without macular edema: Secondary | ICD-10-CM

## 2011-07-10 DIAGNOSIS — I10 Essential (primary) hypertension: Secondary | ICD-10-CM

## 2011-07-10 DIAGNOSIS — H35039 Hypertensive retinopathy, unspecified eye: Secondary | ICD-10-CM

## 2011-07-10 DIAGNOSIS — H2510 Age-related nuclear cataract, unspecified eye: Secondary | ICD-10-CM

## 2011-07-10 DIAGNOSIS — H43819 Vitreous degeneration, unspecified eye: Secondary | ICD-10-CM

## 2012-07-09 ENCOUNTER — Ambulatory Visit (INDEPENDENT_AMBULATORY_CARE_PROVIDER_SITE_OTHER): Payer: BC Managed Care – PPO | Admitting: Ophthalmology

## 2012-07-09 DIAGNOSIS — D3130 Benign neoplasm of unspecified choroid: Secondary | ICD-10-CM

## 2012-07-09 DIAGNOSIS — H35039 Hypertensive retinopathy, unspecified eye: Secondary | ICD-10-CM

## 2012-07-09 DIAGNOSIS — E11319 Type 2 diabetes mellitus with unspecified diabetic retinopathy without macular edema: Secondary | ICD-10-CM

## 2012-07-09 DIAGNOSIS — H43819 Vitreous degeneration, unspecified eye: Secondary | ICD-10-CM

## 2012-07-09 DIAGNOSIS — E1039 Type 1 diabetes mellitus with other diabetic ophthalmic complication: Secondary | ICD-10-CM

## 2012-07-09 DIAGNOSIS — I10 Essential (primary) hypertension: Secondary | ICD-10-CM

## 2012-08-26 ENCOUNTER — Other Ambulatory Visit: Payer: Self-pay | Admitting: Endocrinology

## 2012-08-26 DIAGNOSIS — E291 Testicular hypofunction: Secondary | ICD-10-CM

## 2012-08-26 DIAGNOSIS — E1065 Type 1 diabetes mellitus with hyperglycemia: Secondary | ICD-10-CM

## 2012-09-02 ENCOUNTER — Other Ambulatory Visit (INDEPENDENT_AMBULATORY_CARE_PROVIDER_SITE_OTHER): Payer: BC Managed Care – PPO

## 2012-09-02 DIAGNOSIS — E291 Testicular hypofunction: Secondary | ICD-10-CM

## 2012-09-02 DIAGNOSIS — E1065 Type 1 diabetes mellitus with hyperglycemia: Secondary | ICD-10-CM

## 2012-09-02 LAB — BASIC METABOLIC PANEL
BUN: 28 mg/dL — ABNORMAL HIGH (ref 6–23)
Calcium: 8.8 mg/dL (ref 8.4–10.5)
GFR: 52.37 mL/min — ABNORMAL LOW (ref >60.00)
Glucose, Bld: 152 mg/dL — ABNORMAL HIGH (ref 70–99)
Sodium: 138 mEq/L (ref 135–145)

## 2012-09-02 LAB — HEMOGLOBIN A1C: Hgb A1c MFr Bld: 7.4 % — ABNORMAL HIGH (ref 4.6–6.5)

## 2012-09-04 ENCOUNTER — Other Ambulatory Visit: Payer: Self-pay | Admitting: *Deleted

## 2012-09-04 ENCOUNTER — Encounter: Payer: Self-pay | Admitting: Endocrinology

## 2012-09-04 ENCOUNTER — Ambulatory Visit (INDEPENDENT_AMBULATORY_CARE_PROVIDER_SITE_OTHER): Payer: BC Managed Care – PPO | Admitting: Endocrinology

## 2012-09-04 VITALS — BP 128/62 | HR 73 | Ht 67.5 in | Wt 260.9 lb

## 2012-09-04 DIAGNOSIS — F32A Depression, unspecified: Secondary | ICD-10-CM

## 2012-09-04 DIAGNOSIS — E108 Type 1 diabetes mellitus with unspecified complications: Secondary | ICD-10-CM | POA: Insufficient documentation

## 2012-09-04 DIAGNOSIS — I10 Essential (primary) hypertension: Secondary | ICD-10-CM

## 2012-09-04 DIAGNOSIS — F329 Major depressive disorder, single episode, unspecified: Secondary | ICD-10-CM

## 2012-09-04 DIAGNOSIS — E291 Testicular hypofunction: Secondary | ICD-10-CM

## 2012-09-04 DIAGNOSIS — E1065 Type 1 diabetes mellitus with hyperglycemia: Secondary | ICD-10-CM

## 2012-09-04 DIAGNOSIS — F321 Major depressive disorder, single episode, moderate: Secondary | ICD-10-CM | POA: Insufficient documentation

## 2012-09-04 DIAGNOSIS — IMO0002 Reserved for concepts with insufficient information to code with codable children: Secondary | ICD-10-CM

## 2012-09-04 DIAGNOSIS — F3289 Other specified depressive episodes: Secondary | ICD-10-CM

## 2012-09-04 DIAGNOSIS — E7800 Pure hypercholesterolemia, unspecified: Secondary | ICD-10-CM

## 2012-09-04 MED ORDER — INSULIN ASPART 100 UNIT/ML ~~LOC~~ SOLN: 110.0000 [IU] | Freq: Three times a day (TID) | SUBCUTANEOUS | Status: DC

## 2012-09-04 NOTE — Patient Instructions (Addendum)
May switch to Novolog, may use temp basal for vacuuming, 7am -9 am of 2.3 Carb ratio 1:10 after 4 pm

## 2012-09-04 NOTE — Progress Notes (Signed)
Patient ID: Paul Adkins, male   DOB: [birth year], 67 y.o.   MRN: [id number] Insulin Pump followup:   CURRENT brand:  Medtronic   Insulin Pump:  PAST history: He has had persistently poorly controlled diabetes for several years. A1c has been high with usual range 8.2 -9, overall improved since 2013. Compliance with glucose monitoring and diet has been variable and he does better when he is checking more sugars. His A1c is better than usual in 3/14 but he was having significant hyperglycemia overnight and also occasionally later in the day.  RECENT history: The blood sugars continue to be relatively well controlled with using Invokana which was started in 3/14. He has had somewhat lower readings at times also especially on weekends when he is more active. However his exercise regimen is irregular. Overall dietary compliance is relatively good although blood sugars appear to be lower on the days he is eating less carbohydrate. However has not lost anymore weight  Again not checking blood sugar  often after supper. He had been using infusion site on the buttocks after previous problems with hyperglycemia. He thinks that the occasional hypoglycemia he is having is from overestimating the carbohydrates in his meals and also sometimes when he is doing more activities like vacuuming; he does not change his basal rate during those times. Also appears to have lower sugars so the days he is eating less carbohydrate  BOLUSES: He is bolusing 7.4 times a day, mostly with meals and snacks and does override them about 38 % of the time.   Physical activity: walking for exercise but not consistently The previous HbgA1c was 7.5 In 3/14, before that 7.8   Compared to the last visit the diabetes is about the same  The pump SETTINGS are: The pump settings are Basal rates: 2.5 from midnight- 5 AM. 4.30 a.m. = 2.5. 11 a.m. = 2.9 6 p.m. = 2.8; Boluses 1 unit for 8 g carbohydrate at mealtimes and blood sugar target 110-120,  sensitivity 1:30 until noon and then 1:25 with active insulin 3 hours.  GLUCOSE CONTROL with the pump is assessed today by download.   HYPERGLYCEMIA Occuring at about 4-5 PM are after 8 PM sporadically and also sporadically overnight. However has only 31% of readings above target  HYPOGLYCEMIC episodes are minimal with only 1% of readings below 70 and has about 5 readings below 65 either early morning or after late afternoon or evening bolus  Lab Results  Component Value Date   HGBA1C 7.4* 09/02/2012      MICROALBUMIN has been tested, and the result is normal .  Appointment on 09/02/2012  Component Date Value Range Status  . Sodium 09/02/2012 138  135 - 145 mEq/L Final  . Potassium 09/02/2012 4.3  3.5 - 5.1 mEq/L Final  . Chloride 09/02/2012 107  96 - 112 mEq/L Final  . CO2 09/02/2012 26  19 - 32 mEq/L Final  . Glucose, Bld 09/02/2012 152* 70 - 99 mg/dL Final  . BUN 40/98/1191 28* 6 - 23 mg/dL Final  . Creatinine, Ser 09/02/2012 1.4  0.4 - 1.5 mg/dL Final  . Calcium 47/82/9562 8.8  8.4 - 10.5 mg/dL Final  . GFR 13/09/6576 52.37* >60.00 mL/min Final  . Hemoglobin A1C 09/02/2012 7.4* 4.6 - 6.5 % Final   Glycemic Control Guidelines for People with Diabetes:Non Diabetic:  <6%Goal of Therapy: <7%Additional Action Suggested:  >8%   . Vit D, 25-Hydroxy 09/02/2012 43  30 - 89 ng/mL Final   Comment: This assay  accurately quantifies Vitamin D, which is the sum of the                          25-Hydroxy forms of Vitamin D2 and D3.  Studies have shown that the                          optimum concentration of 25-Hydroxy Vitamin D is 30 ng/mL or higher.                           Concentrations of Vitamin D between 20 and 29 ng/mL are considered to                          be insufficient and concentrations less than 20 ng/mL are considered                          to be deficient for Vitamin D.  . Testosterone, total 09/02/2012 603.1  348.0 - 1197.0 ng/dL Final  . Testosterone, Free 09/02/2012  15.5  6.6 - 18.1 pg/mL Final      Medication List       This list is accurate as of: 09/04/12 11:22 AM.  Always use your most recent med list.               amLODipine 10 MG tablet  Commonly known as:  NORVASC  Take 10 mg by mouth daily. 1/2 tablet once a day     atorvastatin 20 MG tablet  Commonly known as:  LIPITOR     HUMALOG 100 UNIT/ML injection  Generic drug:  insulin lispro     INVOKANA 300 MG Tabs  Generic drug:  Canagliflozin     lisinopril 40 MG tablet  Commonly known as:  PRINIVIL,ZESTRIL     PARoxetine 40 MG tablet  Commonly known as:  PAXIL     TESTIM 50 MG/5GM Gel  Generic drug:  testosterone        Allergies: Allergies not on file  No past medical history on file.  No past surgical history on file.  No family history on file.  Social History:  reports that he has quit smoking. His smoking use included Cigarettes. He smoked 0.00 packs per day. He does not have any smokeless tobacco history on file. His alcohol and drug histories are not on file.  REVIEW of systems:   Eye exam 5/14, previously has had retinopathy  HYPOGONADISM: His level is 603 on Testim which is mid- normal in the current lab and he feels good overall. Previously has had lower levels with one tube of Testim and is compliant with his 1-1/2 tubes of the morning  HYPERTENSION: Very well controlled with lisinopril and amlodipine. Will need to monitor  his creatinine on each visit since it is 1.4 although previous levels were done with a different lab  No recent pedal edema   BP 128/62  Pulse 73  Ht 5' 7.5" (1.715 m)  Wt 260 lb 14.4 oz (118.343 kg)  BMI 40.24 kg/m2  SpO2 95%  ASSESSMENT:  The blood sugars continue to be relatively well controlled with using Invokana which was started in 3/14. He has had somewhat lower readings at times also especially on weekends when he is more active. However his exercise regimen is irregular. Overall dietary compliance is relatively  good  although blood sugars appear to be lower on the days he is eating less carbohydrate Problems identified: Tendency to lower blood sugars at breakfast time and relatively low readings after evening boluses. Also occasional low readings about 4-5 AM but not recently. Not adjusting basal rate for increased physical activity which may cause low sugars. Also probably getting inadequate insulin at lunchtime when eating out with some high readings in the afternoon. Not checking of blood sugars after supper. Still requiring relatively high doses of insulin with 65 units basal daily . Also having difficulty losing weight  Plan: No changes in his basal rate as yet unless he gets more frequent hypoglycemia overnight. However will reduce his carbohydrate coverage at suppertime as he may get low readings after bolusing in evenings and blood sugar pattern indicates lower readings after his early evening boluses Advised him to change his date and time on the pump after reaching [place] when he travels  Counseling time over 50% of today's 25 minute visit  HYPOGONADISM: He is having an excellent level with 1-1/2 tubes of Testim and will continue.

## 2012-09-05 ENCOUNTER — Other Ambulatory Visit: Payer: Self-pay

## 2012-11-29 ENCOUNTER — Other Ambulatory Visit: Payer: Self-pay | Admitting: *Deleted

## 2012-11-29 DIAGNOSIS — E291 Testicular hypofunction: Secondary | ICD-10-CM

## 2012-11-29 DIAGNOSIS — E1065 Type 1 diabetes mellitus with hyperglycemia: Secondary | ICD-10-CM

## 2012-12-03 ENCOUNTER — Other Ambulatory Visit (INDEPENDENT_AMBULATORY_CARE_PROVIDER_SITE_OTHER): Payer: Medicare (Managed Care)

## 2012-12-03 DIAGNOSIS — IMO0002 Reserved for concepts with insufficient information to code with codable children: Secondary | ICD-10-CM

## 2012-12-03 DIAGNOSIS — E291 Testicular hypofunction: Secondary | ICD-10-CM

## 2012-12-03 DIAGNOSIS — E1065 Type 1 diabetes mellitus with hyperglycemia: Secondary | ICD-10-CM

## 2012-12-03 LAB — URINALYSIS
Bilirubin Urine: NEGATIVE
Ketones, ur: NEGATIVE
Specific Gravity, Urine: 1.02 (ref 1.000–1.030)
Urine Glucose: >=1000
pH: 6 (ref 5.0–8.0)

## 2012-12-03 LAB — COMPREHENSIVE METABOLIC PANEL
ALT: 22 U/L (ref 0–53)
AST: 19 U/L (ref 0–37)
Albumin: 3.7 g/dL (ref 3.5–5.2)
Alkaline Phosphatase: 82 U/L (ref 39–117)
Potassium: 4.4 mEq/L (ref 3.5–5.1)
Sodium: 139 mEq/L (ref 135–145)
Total Protein: 6.3 g/dL (ref 6.0–8.3)

## 2012-12-03 LAB — MICROALBUMIN / CREATININE URINE RATIO
Creatinine,U: 101.3 mg/dL
Microalb Creat Ratio: 0.3 mg/g (ref 0.0–30.0)

## 2012-12-03 LAB — HEMOGLOBIN A1C: Hgb A1c MFr Bld: 8.1 % — ABNORMAL HIGH (ref 4.6–6.5)

## 2012-12-05 ENCOUNTER — Ambulatory Visit (INDEPENDENT_AMBULATORY_CARE_PROVIDER_SITE_OTHER): Payer: Medicare (Managed Care) | Admitting: Endocrinology

## 2012-12-05 ENCOUNTER — Encounter: Payer: Self-pay | Admitting: Endocrinology

## 2012-12-05 VITALS — BP 122/50 | HR 82 | Temp 98.5°F | Resp 12 | Ht 67.0 in | Wt 256.1 lb

## 2012-12-05 DIAGNOSIS — IMO0002 Reserved for concepts with insufficient information to code with codable children: Secondary | ICD-10-CM

## 2012-12-05 DIAGNOSIS — E1065 Type 1 diabetes mellitus with hyperglycemia: Secondary | ICD-10-CM

## 2012-12-05 DIAGNOSIS — E291 Testicular hypofunction: Secondary | ICD-10-CM

## 2012-12-05 DIAGNOSIS — I10 Essential (primary) hypertension: Secondary | ICD-10-CM

## 2012-12-05 DIAGNOSIS — Z23 Encounter for immunization: Secondary | ICD-10-CM

## 2012-12-05 NOTE — Progress Notes (Signed)
Patient ID: Paul Adkins, male   DOB: 1945-07-31, 67 y.o.   MRN: [id number]   Diagnosis: Type 1 diabetes, date of onset 1978  PAST history: He has had persistently poorly controlled diabetes for several years. A1c has been high with usual range 8.2 -9, overall improved since 2013. Compliance with glucose monitoring and diet has been variable and he does better when he is checking more sugars. His A1c was better than usual in 3/14 at 7.5 but he was also having significant hypoglycemia overnight and also occasionally later in the day. At that time he was started on Invokana 300 mg daily which helped him with glucose control and mild weight loss  CURRENT insulin pump brand:  Medtronic  RECENT history: The blood sugars are probably higher since his last visit even though he continues to take Invokana. He has not checked his sugars as much lately and A1c is back over 8% again Current blood sugar patterns indicate mostly higher blood sugars overnight which he thinks is from eating later in the evening and snacking. Has not been checking any readings around lunchtime and he sugars in the early evening are quite variable. Also rarely may have readings over 300 in the middle of the day or evening  Has had one instance where he had difficulty with his infusion set causing high readings  DIET: He is not watching his intake as much and is eating frequently throughout the day and carbohydrate intake can be occasionally over 300 g and a day. He is probably  bolusing for about 6 meals or snacks a day on an average   BOLUSES: He is bolusing 6.4 times a day, mostly with meals and snacks and does override them about 36 % of the time.  GLUCOSE readings: Fasting 67-219, relatively lower recently. Around 5-7 PM 65-334 and late evening 58-345 and overall average 180  Physical activity: Very little recently  The pump SETTINGS are: The pump settings are Basal rates: 2.5 from midnight- 5 AM. 4.30 a.m. = 2.5. 11 a.m. =  2.9 6 p.m. = 2.8; Boluses 1 unit for 8 g carbohydrate at mealtimes and blood sugar target 110-120, sensitivity 1:30 until noon and then 1:25 with active insulin 3 hours.  GLUCOSE CONTROL with the pump is assessed today by download.   HYPERGLYCEMIA Occuring at about 4-5 PM are after 8 PM sporadically and also sporadically overnight. However has only 31% of readings above target  HYPOGLYCEMIC episodes are minimal with only 1% of readings below 70 and has about 5 readings below 65 either early morning or after late afternoon or evening bolus  Lab Results  Component Value Date   HGBA1C 8.1* 12/03/2012   HGBA1C 7.4* 09/02/2012   Lab Results  Component Value Date   MICROALBUR 0.3 12/03/2012   CREATININE 1.4 12/03/2012       MICROALBUMIN has been tested, and the result is normal .  Appointment on 12/03/2012  Component Date Value Range Status  . Color, Urine 12/03/2012 LT. YELLOW  Yellow;Lt. Yellow Final  . APPearance 12/03/2012 CLEAR  Clear Final  . Specific Gravity, Urine 12/03/2012 1.020  1.000-1.030 Final  . pH 12/03/2012 6.0  5.0 - 8.0 Final  . Total Protein, Urine 12/03/2012 NEGATIVE  Negative Final  . Urine Glucose 12/03/2012 >=1000  Negative Final  . Ketones, ur 12/03/2012 NEGATIVE  Negative Final  . Bilirubin Urine 12/03/2012 NEGATIVE  Negative Final  . Hgb urine dipstick 12/03/2012 NEGATIVE  Negative Final  . Urobilinogen, UA 12/03/2012 0.2  0.0 - 1.0 Final  . Leukocytes, UA 12/03/2012 NEGATIVE  Negative Final  . Nitrite 12/03/2012 NEGATIVE  Negative Final  . Microalb, Ur 12/03/2012 0.3  0.0 - 1.9 mg/dL Final  . Creatinine,U 16/11/9602 101.3   Final  . Microalb Creat Ratio 12/03/2012 0.3  0.0 - 30.0 mg/g Final  . Hemoglobin A1C 12/03/2012 8.1* 4.6 - 6.5 % Final   Glycemic Control Guidelines for People with Diabetes:Non Diabetic:  <6%Goal of Therapy: <7%Additional Action Suggested:  >8%   . Sodium 12/03/2012 139  135 - 145 mEq/L Final  . Potassium 12/03/2012 4.4  3.5 - 5.1  mEq/L Final  . Chloride 12/03/2012 107  96 - 112 mEq/L Final  . CO2 12/03/2012 26  19 - 32 mEq/L Final  . Glucose, Bld 12/03/2012 138* 70 - 99 mg/dL Final  . BUN 54/10/8117 26* 6 - 23 mg/dL Final  . Creatinine, Ser 12/03/2012 1.4  0.4 - 1.5 mg/dL Final  . Total Bilirubin 12/03/2012 0.6  0.3 - 1.2 mg/dL Final  . Alkaline Phosphatase 12/03/2012 82  39 - 117 U/L Final  . AST 12/03/2012 19  0 - 37 U/L Final  . ALT 12/03/2012 22  0 - 53 U/L Final  . Total Protein 12/03/2012 6.3  6.0 - 8.3 g/dL Final  . Albumin 14/78/2956 3.7  3.5 - 5.2 g/dL Final  . Calcium 21/30/8657 8.5  8.4 - 10.5 mg/dL Final  . GFR 84/69/6295 55.43* >60.00 mL/min Final  . Testosterone 12/03/2012 920.70* 350.00 - 890.00 ng/dL Final      Medication List       This list is accurate as of: 12/05/12  9:42 AM.  Always use your most recent med list.               amLODipine 10 MG tablet  Commonly known as:  NORVASC  Take 10 mg by mouth daily. 1/2 tablet once a day     atorvastatin 20 MG tablet  Commonly known as:  LIPITOR     HUMALOG 100 UNIT/ML injection  Generic drug:  insulin lispro     insulin aspart 100 UNIT/ML injection  Commonly known as:  novoLOG  Inject 110 Units into the skin 3 (three) times daily before meals. With pump     INVOKANA 300 MG Tabs  Generic drug:  Canagliflozin     lisinopril 40 MG tablet  Commonly known as:  PRINIVIL,ZESTRIL     PARoxetine 40 MG tablet  Commonly known as:  PAXIL     TESTIM 50 MG/5GM Gel  Generic drug:  testosterone        Allergies: No Known Allergies  No past medical history on file.  No past surgical history on file.  No family history on file.  Social History:  reports that he has quit smoking. His smoking use included Cigarettes. He smoked 0.00 packs per day. He does not have any smokeless tobacco history on file. His alcohol and drug histories are not on file.  REVIEW of systems:   Eye exam 5/14, previously has had retinopathy  HYPOGONADISM:  His level is now above normal on Testim. Previously has had lower levels with one tube of Testim and his dose was increased to one half to. On his last visit level was about 600.  HYPERTENSION: Very well controlled with lisinopril and amlodipine. Also his creatinine tends to be upper normal for no apparent reason  He has had lower leg edema, somewhat worse in the evenings. Did not have any while  traveling to [place] when he used the last stockings that go up to the knee  Asking about tender spot on the back of the left knee  EXAM:   BP 122/50  Pulse 82  Temp(Src) 98.5 F (36.9 C)  Resp 12  Ht 5\' 7"  (1.702 m)  Wt 256 lb 1.6 oz (116.166 kg)  BMI 40.1 kg/m2  SpO2 97%  Exam of his left knee looks normal, no obvious Baker's cyst identified 1+ lower leg edema present bilaterally  ASSESSMENT:  The blood sugars are relatively higher with increased A1c and some high readings overnight. This is likely to be from his inconsistent diet even though his weight is slightly better See history of present illness for detailed analysis of his blood sugar patterns, pump management and problems identified  Plan: Since his blood sugars are not very consistent with not make any significant changes. Will need to increase his midnight basal rate since overnight readings are relatively higher. He thinks his blood sugars might be better with starting regular exercise program walking or biking. Also again discussed trying to get a continuous glucose sensor which may help his compliance and avoiding unusually high or low readings Also does need to check blood sugars more frequently  HYPOGONADISM: His testosterone level is now higher than normal even though he has continued the same dose of Testim, will reduce the dose to one tube a day   Leg edema: This has recurred and appears to be a little more on the right. Will have him use elastic stockings daily which he previously used when traveling  Probable  Baker's cyst

## 2012-12-05 NOTE — Patient Instructions (Addendum)
Reduce Testim to 1 tube daily  MN basal to 2.5  Compression hose daily

## 2012-12-25 ENCOUNTER — Other Ambulatory Visit: Payer: Self-pay | Admitting: *Deleted

## 2012-12-25 MED ORDER — PAROXETINE HCL 40 MG PO TABS: 40.0000 mg | ORAL_TABLET | ORAL | Status: DC

## 2013-01-03 ENCOUNTER — Encounter: Payer: Self-pay | Admitting: Endocrinology

## 2013-01-03 ENCOUNTER — Ambulatory Visit (INDEPENDENT_AMBULATORY_CARE_PROVIDER_SITE_OTHER): Payer: Medicare (Managed Care) | Admitting: Endocrinology

## 2013-01-03 VITALS — BP 140/62 | HR 77 | Temp 98.6°F | Resp 12 | Ht 67.0 in | Wt 258.8 lb

## 2013-01-03 DIAGNOSIS — M5412 Radiculopathy, cervical region: Secondary | ICD-10-CM

## 2013-01-03 NOTE — Patient Instructions (Signed)
May take Advil as needed

## 2013-01-03 NOTE — Progress Notes (Signed)
Paul Adkins is a 67 y.o. male.   Chief complaint:  History of Present Illness:  About a week ago he woke up at 2 AM with moderately severe pain in his left arm on the inside part and radiating towards the wrist and some to the inner chest. Did not have any chest discomfort or radiation to the neck. He did not have any associated sweating, shortness of breath, dizziness or palpitations. No associated numbness in his arm or hand His glucose was 67 at that time He took one 60 mg aspirin and was relieved in about 2-3 hours. Subsequently he had intermittent pain with certain movements of his arm and felt better with taking ibuprofen. Recently has been having very little discomfort but wanted to be checked today    Medication List       This list is accurate as of: 01/03/13 11:59 PM.  Always use your most recent med list.               amLODipine 10 MG tablet  Commonly known as:  NORVASC  Take 10 mg by mouth daily. 1/2 tablet once a day     aspirin 81 MG tablet  Take 81 mg by mouth daily.     atorvastatin 20 MG tablet  Commonly known as:  LIPITOR     insulin aspart 100 UNIT/ML injection  Commonly known as:  novoLOG  Inject 110 Units into the skin 3 (three) times daily before meals. With pump     INVOKANA 300 MG Tabs  Generic drug:  Canagliflozin     lisinopril 40 MG tablet  Commonly known as:  PRINIVIL,ZESTRIL     PARoxetine 40 MG tablet  Commonly known as:  PAXIL  Take 1 tablet (40 mg total) by mouth every morning.     TESTIM 50 MG/5GM Gel  Generic drug:  testosterone        Allergies: No Known Allergies  No past medical history on file.  No past surgical history on file.  Family History  Problem Relation Age of Onset  . Cancer Father     Prostate  . Diabetes Neg Hx     Social History:  reports that he has quit smoking. His smoking use included Cigarettes. He smoked 0.00 packs per day. He does not have any smokeless tobacco history on file. His alcohol and  drug histories are not on file.  Review of Systems   No visits with results within 1 Week(s) from this visit. Latest known visit with results is:  Appointment on 12/03/2012  Component Date Value Range Status  . Color, Urine 12/03/2012 LT. YELLOW  Yellow;Lt. Yellow Final  . APPearance 12/03/2012 CLEAR  Clear Final  . Specific Gravity, Urine 12/03/2012 1.020  1.000-1.030 Final  . pH 12/03/2012 6.0  5.0 - 8.0 Final  . Total Protein, Urine 12/03/2012 NEGATIVE  Negative Final  . Urine Glucose 12/03/2012 >=1000  Negative Final  . Ketones, ur 12/03/2012 NEGATIVE  Negative Final  . Bilirubin Urine 12/03/2012 NEGATIVE  Negative Final  . Hgb urine dipstick 12/03/2012 NEGATIVE  Negative Final  . Urobilinogen, UA 12/03/2012 0.2  0.0 - 1.0 Final  . Leukocytes, UA 12/03/2012 NEGATIVE  Negative Final  . Nitrite 12/03/2012 NEGATIVE  Negative Final  . Microalb, Ur 12/03/2012 0.3  0.0 - 1.9 mg/dL Final  . Creatinine,U 32/44/0102 101.3   Final  . Microalb Creat Ratio 12/03/2012 0.3  0.0 - 30.0 mg/g Final  . Hemoglobin A1C 12/03/2012 8.1* 4.6 - 6.5 %  Final   Glycemic Control Guidelines for People with Diabetes:Non Diabetic:  <6%Goal of Therapy: <7%Additional Action Suggested:  >8%   . Sodium 12/03/2012 139  135 - 145 mEq/L Final  . Potassium 12/03/2012 4.4  3.5 - 5.1 mEq/L Final  . Chloride 12/03/2012 107  96 - 112 mEq/L Final  . CO2 12/03/2012 26  19 - 32 mEq/L Final  . Glucose, Bld 12/03/2012 138* 70 - 99 mg/dL Final  . BUN 56/21/3086 26* 6 - 23 mg/dL Final  . Creatinine, Ser 12/03/2012 1.4  0.4 - 1.5 mg/dL Final  . Total Bilirubin 12/03/2012 0.6  0.3 - 1.2 mg/dL Final  . Alkaline Phosphatase 12/03/2012 82  39 - 117 U/L Final  . AST 12/03/2012 19  0 - 37 U/L Final  . ALT 12/03/2012 22  0 - 53 U/L Final  . Total Protein 12/03/2012 6.3  6.0 - 8.3 g/dL Final  . Albumin 57/84/6962 3.7  3.5 - 5.2 g/dL Final  . Calcium 95/28/4132 8.5  8.4 - 10.5 mg/dL Final  . GFR 44/02/270 55.43* >60.00 mL/min  Final  . Testosterone 12/03/2012 920.70* 350.00 - 890.00 ng/dL Final    EXAM:  BP 536/64  Pulse 77  Temp(Src) 98.6 F (37 C)  Resp 12  Ht 5\' 7"  (1.702 m)  Wt 258 lb 12.8 oz (117.391 kg)  BMI 40.52 kg/m2  SpO2 97%  He has no difficulty with movement of the left arm including overhead. Has only some discomfort with flexing the arm inwards while bending at the elbow He has some tenderness at the roof of the axilla on the left but no other area is tender No lymphadenopathy in the axillary area Biceps reflexes are normal, triceps and brachial radialis somewhat reduced Normal touch sensation in fingers  Assessment/Plan:   He may have had a brachial plexus inflammation or possibly neuritis related to diabetes causing acute symptoms with gradual resolution now Reassured him that since he does not have any neurological signs or symptoms he does not need any further evaluation He can try a course of nonsteroidal drugs but he is fairly comfortable now and can take ibuprofen as needed  Karmella Bouvier 01/05/2013, 2:27 PM

## 2013-01-05 ENCOUNTER — Encounter: Payer: Self-pay | Admitting: Endocrinology

## 2013-01-13 ENCOUNTER — Other Ambulatory Visit: Payer: Self-pay | Admitting: *Deleted

## 2013-01-13 MED ORDER — ATORVASTATIN CALCIUM 20 MG PO TABS: 20.0000 mg | ORAL_TABLET | Freq: Every day | ORAL | Status: DC

## 2013-01-27 ENCOUNTER — Other Ambulatory Visit: Payer: Self-pay | Admitting: *Deleted

## 2013-01-27 MED ORDER — CANAGLIFLOZIN 300 MG PO TABS: 300.0000 mg | ORAL_TABLET | Freq: Every day | ORAL | Status: DC

## 2013-01-27 MED ORDER — AMLODIPINE BESYLATE 5 MG PO TABS: 5.0000 mg | ORAL_TABLET | Freq: Every day | ORAL | Status: DC

## 2013-01-31 ENCOUNTER — Ambulatory Visit (INDEPENDENT_AMBULATORY_CARE_PROVIDER_SITE_OTHER): Payer: PRIVATE HEALTH INSURANCE | Admitting: Endocrinology

## 2013-01-31 ENCOUNTER — Encounter: Payer: Self-pay | Admitting: Endocrinology

## 2013-01-31 ENCOUNTER — Ambulatory Visit
Admission: RE | Admit: 2013-01-31 | Discharge: 2013-01-31 | Disposition: A | Discharge status: Home / Self Care | Payer: PRIVATE HEALTH INSURANCE | Source: Ambulatory Visit | Attending: Endocrinology | Admitting: Endocrinology

## 2013-01-31 VITALS — BP 120/52 | HR 74 | Temp 98.3°F | Resp 12 | Ht 67.0 in | Wt 255.1 lb

## 2013-01-31 DIAGNOSIS — J209 Acute bronchitis, unspecified: Secondary | ICD-10-CM

## 2013-01-31 NOTE — Progress Notes (Signed)
Quick Note:  Please let patient know that the result is normal and no further action needed ______ 

## 2013-01-31 NOTE — Progress Notes (Signed)
Patient ID: Paul Adkins, male   DOB: 19-Mar-1945, 67 y.o.   MRN: [id number]  Paul Adkins is a 67 y.o. male.   Chief complaint: Cough  History of Present Illness:  He has had a second episode of upper respiratory infection after traveling by plane last week. Previously had a respiratory infection after traveling overseas and this time it occurred right after he came back from his trip. He says he had significant cough along with fever of up to 102. He had only mild nasal congestion and sore throat He was having more sputum which is less now and at times discolored only. Occasional wheezing but no shortness of breath. His fever has resolved and his cough has been better with OTC medication, not having difficulty sleeping at night  He is concerned about getting another episode when he is traveling in another 2 weeks and wanted evaluation today    Medication List       This list is accurate as of: 01/31/13  8:38 AM.  Always use your most recent med list.               amLODipine 5 MG tablet  Commonly known as:  NORVASC  Take 1 tablet (5 mg total) by mouth daily. Take 1 tablet daily     aspirin 81 MG tablet  Take 81 mg by mouth daily.     atorvastatin 20 MG tablet  Commonly known as:  LIPITOR  Take 1 tablet (20 mg total) by mouth daily.     Canagliflozin 300 MG Tabs  Commonly known as:  INVOKANA  Take 1 tablet (300 mg total) by mouth daily.     insulin aspart 100 UNIT/ML injection  Commonly known as:  novoLOG  Inject 110 Units into the skin 3 (three) times daily before meals. With pump     lisinopril 40 MG tablet  Commonly known as:  PRINIVIL,ZESTRIL     PARoxetine 40 MG tablet  Commonly known as:  PAXIL  Take 1 tablet (40 mg total) by mouth every morning.     TESTIM 50 MG/5GM Gel  Generic drug:  testosterone        Allergies: No Known Allergies  No past medical history on file.  No past surgical history on file.  Family History  Problem Relation Age of  Onset  . Cancer Father     Prostate  . Diabetes Neg Hx     Social History:  reports that he has quit smoking. His smoking use included Cigarettes. He smoked 0.00 packs per day. He does not have any smokeless tobacco history on file. His alcohol and drug histories are not on file.  Review of Systems   No visits with results within 1 Week(s) from this visit. Latest known visit with results is:  Appointment on 12/03/2012  Component Date Value Range Status  . Color, Urine 12/03/2012 LT. YELLOW  Yellow;Lt. Yellow Final  . APPearance 12/03/2012 CLEAR  Clear Final  . Specific Gravity, Urine 12/03/2012 1.020  1.000-1.030 Final  . pH 12/03/2012 6.0  5.0 - 8.0 Final  . Total Protein, Urine 12/03/2012 NEGATIVE  Negative Final  . Urine Glucose 12/03/2012 >=1000  Negative Final  . Ketones, ur 12/03/2012 NEGATIVE  Negative Final  . Bilirubin Urine 12/03/2012 NEGATIVE  Negative Final  . Hgb urine dipstick 12/03/2012 NEGATIVE  Negative Final  . Urobilinogen, UA 12/03/2012 0.2  0.0 - 1.0 Final  . Leukocytes, UA 12/03/2012 NEGATIVE  Negative Final  . Nitrite 12/03/2012 NEGATIVE  Negative Final  . Microalb, Ur 12/03/2012 0.3  0.0 - 1.9 mg/dL Final  . Creatinine,U 40/98/1191 101.3   Final  . Microalb Creat Ratio 12/03/2012 0.3  0.0 - 30.0 mg/g Final  . Hemoglobin A1C 12/03/2012 8.1* 4.6 - 6.5 % Final   Glycemic Control Guidelines for People with Diabetes:Non Diabetic:  <6%Goal of Therapy: <7%Additional Action Suggested:  >8%   . Sodium 12/03/2012 139  135 - 145 mEq/L Final  . Potassium 12/03/2012 4.4  3.5 - 5.1 mEq/L Final  . Chloride 12/03/2012 107  96 - 112 mEq/L Final  . CO2 12/03/2012 26  19 - 32 mEq/L Final  . Glucose, Bld 12/03/2012 138* 70 - 99 mg/dL Final  . BUN 47/82/9562 26* 6 - 23 mg/dL Final  . Creatinine, Ser 12/03/2012 1.4  0.4 - 1.5 mg/dL Final  . Total Bilirubin 12/03/2012 0.6  0.3 - 1.2 mg/dL Final  . Alkaline Phosphatase 12/03/2012 82  39 - 117 U/L Final  . AST 12/03/2012 19  0  - 37 U/L Final  . ALT 12/03/2012 22  0 - 53 U/L Final  . Total Protein 12/03/2012 6.3  6.0 - 8.3 g/dL Final  . Albumin 13/09/6576 3.7  3.5 - 5.2 g/dL Final  . Calcium 46/96/2952 8.5  8.4 - 10.5 mg/dL Final  . GFR 84/13/2440 55.43* >60.00 mL/min Final  . Testosterone 12/03/2012 920.70* 350.00 - 890.00 ng/dL Final    EXAM:  BP 102/72  Pulse 74  Temp(Src) 98.3 F (36.8 C)  Resp 12  Ht 5\' 7"  (1.702 m)  Wt 255 lb 1.6 oz (115.713 kg)  BMI 39.95 kg/m2  SpO2 98%  No lymphadenopathy no neck Lungs clear  Assessment/Plan:   Recent viral upper respiratory infection, appears to be resolving. His fever and sputum discoloration have improved and will not need antibiotics. He will continue OTC symptomatic treatment With his recurrent infection will check a chest x-ray to rule out underlying problem  Stasia Somero 01/31/2013, 8:38 AM   Addendum: chest x-ray normal

## 2013-02-26 ENCOUNTER — Other Ambulatory Visit: Payer: Self-pay | Admitting: *Deleted

## 2013-02-28 ENCOUNTER — Other Ambulatory Visit (INDEPENDENT_AMBULATORY_CARE_PROVIDER_SITE_OTHER): Payer: PRIVATE HEALTH INSURANCE

## 2013-02-28 DIAGNOSIS — E291 Testicular hypofunction: Secondary | ICD-10-CM

## 2013-02-28 DIAGNOSIS — IMO0002 Reserved for concepts with insufficient information to code with codable children: Secondary | ICD-10-CM

## 2013-02-28 DIAGNOSIS — E1065 Type 1 diabetes mellitus with hyperglycemia: Secondary | ICD-10-CM

## 2013-02-28 LAB — COMPREHENSIVE METABOLIC PANEL
ALK PHOS: 72 U/L (ref 39–117)
ALT: 31 U/L (ref 0–53)
AST: 19 U/L (ref 0–37)
Albumin: 4 g/dL (ref 3.5–5.2)
BUN: 21 mg/dL (ref 6–23)
CO2: 27 mEq/L (ref 19–32)
CREATININE: 1.3 mg/dL (ref 0.4–1.5)
Calcium: 8.8 mg/dL (ref 8.4–10.5)
Chloride: 108 mEq/L (ref 96–112)
GFR: 56.82 mL/min — ABNORMAL LOW (ref >60.00)
GLUCOSE: 99 mg/dL (ref 70–99)
Potassium: 4.2 mEq/L (ref 3.5–5.1)
Sodium: 141 mEq/L (ref 135–145)
Total Bilirubin: 0.7 mg/dL (ref 0.3–1.2)
Total Protein: 6.8 g/dL (ref 6.0–8.3)

## 2013-02-28 LAB — HEMOGLOBIN A1C: Hgb A1c MFr Bld: 7.7 % — ABNORMAL HIGH (ref 4.6–6.5)

## 2013-03-02 LAB — TESTOSTERONE, TOTAL, LC/MS: TESTOSTERONE, TOTAL: 400.5 ng/dL (ref 348.0–1197.0)

## 2013-03-03 ENCOUNTER — Other Ambulatory Visit: Payer: Self-pay | Admitting: *Deleted

## 2013-03-03 MED ORDER — TESTOSTERONE 50 MG/5GM (1%) TD GEL: TRANSDERMAL | Status: DC

## 2013-03-03 MED ORDER — GLUCOSE BLOOD VI STRP: ORAL_STRIP | Status: DC

## 2013-03-04 ENCOUNTER — Encounter: Payer: Self-pay | Admitting: Endocrinology

## 2013-03-04 ENCOUNTER — Ambulatory Visit (INDEPENDENT_AMBULATORY_CARE_PROVIDER_SITE_OTHER): Payer: PRIVATE HEALTH INSURANCE | Admitting: Endocrinology

## 2013-03-04 VITALS — BP 132/64 | HR 77 | Temp 98.3°F | Resp 12 | Ht 67.0 in | Wt 257.9 lb

## 2013-03-04 DIAGNOSIS — E1065 Type 1 diabetes mellitus with hyperglycemia: Secondary | ICD-10-CM

## 2013-03-04 DIAGNOSIS — IMO0002 Reserved for concepts with insufficient information to code with codable children: Secondary | ICD-10-CM

## 2013-03-04 DIAGNOSIS — I10 Essential (primary) hypertension: Secondary | ICD-10-CM

## 2013-03-04 DIAGNOSIS — E291 Testicular hypofunction: Secondary | ICD-10-CM

## 2013-03-04 NOTE — Patient Instructions (Signed)
Exercise daily  Avoid night snacks; check sugars during night  Check sugars 3-4x daily

## 2013-03-04 NOTE — Progress Notes (Signed)
Patient ID: Paul Adkins, male   DOB: 03-17-1945, 68 y.o.   MRN: [id number]   Diagnosis: Type 1 diabetes, date of onset 1978  PAST history: He has had persistently poorly controlled diabetes for several years. A1c has been high with usual range 8.2 -9, overall improved since 2013. Compliance with glucose monitoring and diet has been variable and he does better when he is checking more sugars. His A1c was better than usual in 3/14 at 7.5 but he was also having significant hypoglycemia overnight and also occasionally later in the day. At that time he was started on Invokana 300 mg daily which helped him with glucose control and mild weight loss  CURRENT insulin pump brand:  Medtronic   PUMP SETTINGS are: Basal rates: 2.4 from midnight- 4:30 AM. 4.30 a.m. = 2.3. 11 a.m. = 2.65, 12:30 p.m. = 2.85, 6 p.m. = 2.75  Boluses 1 unit for 8 g carbohydrate at mealtimes and blood sugar target 110-120, sensitivity 1:30 until noon and then 1:25 with active insulin 3 hours.   RECENT history: The blood sugars are overall better since his last visit even though he did have some high readings last month over the holidays. He continues to take Invokana which had previously helped. Also taking somewhat less basal more recently He has not checked his sugars in the last week as he ran out of strips but is otherwise checking at least twice a day; previously had had noncompliance with glucose monitoring Current blood sugar patterns:  Mostly high readings overnight and late night but has not checked recently, he thinks this is from late night snacks which she sometimes has poor peer of hypoglycemia  Variable fasting readings with only one low normal reading of 64, however glucose today was 177 from snack during the night, average glucose on waking up 158  Mostly high readings around supper time over the holidays but has some good readings including 102 last evening  Usually not assessing postprandial readings and not  clear if his boluses are adequate; diffuse readings indicate good results Has had one instance on 1/1 where he had difficulty with his infusion set causing high readings  DIET: He is not watching his intake consistently and is eating frequently some snacks at night. However average carbohydrate intake is now 164    BOLUSES: He is bolusing 6.6 times a day, mostly with meals and snacks and does override them about 30 % of the time. Only 17% with correction Current daily basal is 62 units  GLUCOSE readings: Reviewed for the last 2 weeks but no readings for about 3 days recently  PREMEAL Breakfast Lunch Dinner Bedtime Overall  Glucose range:  64-338   120-350   83-376   120-300    Mean/median:      175    Physical activity: Very little recently  Wt Readings from Last 3 Encounters:  03/04/13 257 lb 14.4 oz (116.983 kg)  01/31/13 255 lb 1.6 oz (115.713 kg)  01/03/13 258 lb 12.8 oz (117.391 kg)    Lab Results  Component Value Date   HGBA1C 7.7* 02/28/2013   HGBA1C 8.1* 12/03/2012   HGBA1C 7.4* 09/02/2012   Lab Results  Component Value Date   MICROALBUR 0.3 12/03/2012   CREATININE 1.3 02/28/2013     Appointment on 02/28/2013  Component Date Value Range Status  . Sodium 02/28/2013 141  135 - 145 mEq/L Final  . Potassium 02/28/2013 4.2  3.5 - 5.1 mEq/L Final  . Chloride 02/28/2013 108  96 - 112 mEq/L Final  . CO2 02/28/2013 27  19 - 32 mEq/L Final  . Glucose, Bld 02/28/2013 99  70 - 99 mg/dL Final  . BUN 02/28/2013 21  6 - 23 mg/dL Final  . Creatinine, Ser 02/28/2013 1.3  0.4 - 1.5 mg/dL Final  . Total Bilirubin 02/28/2013 0.7  0.3 - 1.2 mg/dL Final  . Alkaline Phosphatase 02/28/2013 72  39 - 117 U/L Final  . AST 02/28/2013 19  0 - 37 U/L Final  . ALT 02/28/2013 31  0 - 53 U/L Final  . Total Protein 02/28/2013 6.8  6.0 - 8.3 g/dL Final  . Albumin 02/28/2013 4.0  3.5 - 5.2 g/dL Final  . Calcium 02/28/2013 8.8  8.4 - 10.5 mg/dL Final  . GFR 02/28/2013 56.82* >60.00 mL/min Final  .  Hemoglobin A1C 02/28/2013 7.7* 4.6 - 6.5 % Final   Glycemic Control Guidelines for People with Diabetes:Non Diabetic:  <6%Goal of Therapy: <7%Additional Action Suggested:  >8%   . Testosterone, total 02/28/2013 400.5  348.0 - 1197.0 ng/dL Final  . Comment 02/28/2013 Comment   Final   Comment: Adult male reference interval is based on a population of lean males                          up to 66 years old.      Medication List       This list is accurate as of: 03/04/13  9:58 AM.  Always use your most recent med list.               amLODipine 5 MG tablet  Commonly known as:  NORVASC  Take 1 tablet (5 mg total) by mouth daily. Take 1 tablet daily     aspirin 81 MG tablet  Take 81 mg by mouth daily.     atorvastatin 20 MG tablet  Commonly known as:  LIPITOR  Take 1 tablet (20 mg total) by mouth daily.     Canagliflozin 300 MG Tabs  Commonly known as:  INVOKANA  Take 1 tablet (300 mg total) by mouth daily.     glucose blood test strip  Commonly known as:  BAYER CONTOUR NEXT TEST  Use as instructed to check blood sugars 4 times per day dx code 250.03     insulin aspart 100 UNIT/ML injection  Commonly known as:  novoLOG  Inject 110 Units into the skin once. With pump     lisinopril 40 MG tablet  Commonly known as:  PRINIVIL,ZESTRIL     PARoxetine 40 MG tablet  Commonly known as:  PAXIL  Take 1 tablet (40 mg total) by mouth every morning.     testosterone 50 MG/5GM Gel  Commonly known as:  ANDROGEL  5 g. Use 1 tube daily as directed        Allergies: No Known Allergies  No past medical history on file.  No past surgical history on file.  Family History  Problem Relation Age of Onset  . Cancer Father     Prostate  . Diabetes Neg Hx     Social History:  reports that he has quit smoking. His smoking use included Cigarettes. He smoked 0.00 packs per day. He does not have any smokeless tobacco history on file. His alcohol and drug histories are not on  file.  REVIEW of systems:   Eye exam 5/14, previously has had retinopathy  HYPOGONADISM: His level is now above normal  on Testim. Previously has had higher levels with 1.5 tubes of Testim and his dose was decreased to 1 and now his testosterone level is 400  HYPERTENSION: well controlled with lisinopril and amlodipine. Also his creatinine tends to be upper normal for no apparent reason  He has had lower leg edema, somewhat worse in the evenings. Less significant recently  EXAM:   BP 132/64  Pulse 77  Temp(Src) 98.3 F (36.8 C)  Resp 12  Ht 5\' 7"  (1.702 m)  Wt 257 lb 14.4 oz (116.983 kg)  BMI 40.38 kg/m2  SpO2 97%   ASSESSMENT:  The blood sugars are relatively better with improved A1c However as discussed in history of present illness his blood sugars overall are relatively high over the holidays, averaging 175 This is likely to be from his inconsistent diet even though his weight is stable See history of present illness for detailed analysis of his blood sugar patterns, pump management and problems identified  Plan: Since his blood sugars are not very inconsistent with not make any basal rate change Also does need to check blood sugars more consistently including some after meals Discussed need for restarting regular exercise which he has not been motivated to do. He can incorporate some resistance training along with aerobic activity for more prolonged improvement in blood sugar Will need to reduce his basal rate if he starts getting lower readings with exercise Advised him to check his blood sugar more overnight and avoid excessive snacks at bedtime and during the night  HYPOGONADISM: His testosterone level is now normal he will continue to use 5 g daily of Testim, 1 tube every morning  HYPERTENSION: Blood pressure well controlled, creatinine stable  Total visit time including counseling = 25 minutes  Randal Goens 03/04/2013

## 2013-03-21 ENCOUNTER — Other Ambulatory Visit: Payer: Self-pay | Admitting: *Deleted

## 2013-05-19 ENCOUNTER — Other Ambulatory Visit: Payer: Self-pay | Admitting: *Deleted

## 2013-05-19 MED ORDER — INSULIN ASPART 100 UNIT/ML ~~LOC~~ SOLN: 110.0000 [IU] | Freq: Once | SUBCUTANEOUS | Status: DC

## 2013-05-22 ENCOUNTER — Other Ambulatory Visit: Payer: Medicare Other

## 2013-05-23 ENCOUNTER — Other Ambulatory Visit (INDEPENDENT_AMBULATORY_CARE_PROVIDER_SITE_OTHER): Payer: PRIVATE HEALTH INSURANCE

## 2013-05-23 DIAGNOSIS — IMO0002 Reserved for concepts with insufficient information to code with codable children: Secondary | ICD-10-CM

## 2013-05-23 DIAGNOSIS — E1065 Type 1 diabetes mellitus with hyperglycemia: Secondary | ICD-10-CM

## 2013-05-23 LAB — COMPREHENSIVE METABOLIC PANEL
ALK PHOS: 68 U/L (ref 39–117)
ALT: 23 U/L (ref 0–53)
AST: 18 U/L (ref 0–37)
Albumin: 4 g/dL (ref 3.5–5.2)
BILIRUBIN TOTAL: 0.6 mg/dL (ref 0.3–1.2)
BUN: 26 mg/dL — AB (ref 6–23)
CO2: 25 mEq/L (ref 19–32)
CREATININE: 1.5 mg/dL (ref 0.4–1.5)
Calcium: 8.9 mg/dL (ref 8.4–10.5)
Chloride: 105 mEq/L (ref 96–112)
GFR: 49.47 mL/min — ABNORMAL LOW (ref >60.00)
Glucose, Bld: 122 mg/dL — ABNORMAL HIGH (ref 70–99)
Potassium: 4.4 mEq/L (ref 3.5–5.1)
Sodium: 137 mEq/L (ref 135–145)
Total Protein: 6.7 g/dL (ref 6.0–8.3)

## 2013-05-23 LAB — HEMOGLOBIN A1C: HEMOGLOBIN A1C: 7.9 % — AB (ref 4.6–6.5)

## 2013-05-23 LAB — LIPID PANEL
CHOL/HDL RATIO: 4
Cholesterol: 132 mg/dL (ref 0–200)
HDL: 37.2 mg/dL — AB (ref >39.00)
LDL Cholesterol: 68 mg/dL (ref 0–99)
Triglycerides: 133 mg/dL (ref 0.0–149.0)
VLDL: 26.6 mg/dL (ref 0.0–40.0)

## 2013-05-23 LAB — MICROALBUMIN / CREATININE URINE RATIO
Creatinine,U: 112.8 mg/dL
MICROALB UR: 0.5 mg/dL (ref 0.0–1.9)
Microalb Creat Ratio: 0.4 mg/g (ref 0.0–30.0)

## 2013-05-26 ENCOUNTER — Other Ambulatory Visit: Payer: Self-pay | Admitting: *Deleted

## 2013-05-26 ENCOUNTER — Telehealth: Payer: Self-pay | Admitting: *Deleted

## 2013-05-26 ENCOUNTER — Encounter: Payer: Self-pay | Admitting: Endocrinology

## 2013-05-26 ENCOUNTER — Ambulatory Visit (INDEPENDENT_AMBULATORY_CARE_PROVIDER_SITE_OTHER): Payer: PRIVATE HEALTH INSURANCE | Admitting: Endocrinology

## 2013-05-26 VITALS — BP 124/62 | HR 75 | Temp 98.2°F | Resp 16 | Ht 67.0 in | Wt 256.2 lb

## 2013-05-26 DIAGNOSIS — IMO0002 Reserved for concepts with insufficient information to code with codable children: Secondary | ICD-10-CM

## 2013-05-26 DIAGNOSIS — E1065 Type 1 diabetes mellitus with hyperglycemia: Secondary | ICD-10-CM

## 2013-05-26 DIAGNOSIS — E291 Testicular hypofunction: Secondary | ICD-10-CM

## 2013-05-26 MED ORDER — TESTOSTERONE 50 MG/5GM (1%) TD GEL: 5.0000 g | Freq: Every day | TRANSDERMAL | Status: DC

## 2013-05-26 NOTE — Telephone Encounter (Signed)
rx sent

## 2013-05-26 NOTE — Progress Notes (Signed)
Patient ID: Paul Adkins, male   DOB: January 17, 1946, 68 y.o.   MRN: [id number]    Diagnosis: Type 1 diabetes, date of onset 1978  PAST history: He has had persistently poorly controlled diabetes for several years. A1c has been high with usual range 8.2 -9, overall improved since 2013. Compliance with glucose monitoring and diet has been variable and he does better when he is checking more sugars. His A1c was better than usual in 3/14 at 7.5 but he was also having significant hypoglycemia overnight and also occasionally later in the day. At that time he was started on Invokana 300 mg daily which helped him with glucose control and mild weight loss  CURRENT insulin pump brand:  Medtronic   PUMP SETTINGS are: Basal rates: 2.25 from midnight- 4 AM. 4 AM = 2.15. 11 a.m. = 2.55, 12:30 p.m. = 2.75, 6 p.m. = 2.75  Boluses : 1 unit for 8 g carbs at mealtimes and blood sugar target 110-120, sensitivity 1:30 until noon and then 1:25 with active insulin 3 hours.  Changing infusion set every 3-4 days  RECENT history: The blood sugars are overall better with his changing his diet significantly over the last 3 weeks after discussion with his psychologist. He has cut back on carbohydrates and processed foods Already has lowered his basal rates somewhat since his last visit because of tendency to low sugars and overall blood sugars are still excellent. Compliance with glucose monitoring is better and he is doing an average of 3.5 times a day Overall blood sugar average is significantly lower His A1c has not improved as yet and is still near 8%  Current blood sugar patterns:  Mostly low normal readings recently fasting with the lowest reading 61  Variable readings in the evenings, some slightly higher  Not able to identify postprandial patterns as he is often not checking readings after meals  Tendency to hypoglycemia with increased activities on weekends and he does not adjust his basal rate for this  Blood  sugars around lunchtime recently are unknown as he is not monitoring but previously slightly high   Average reading 136, previously 175  Other problems identified:  Some hypoglycemia unawareness and with low normal blood sugars he only has symptoms of not being able to sleep. With lower readings he has difficulty talking  Not remembering to reduce his basal rates for exercise or activity  Readings above target 43% and below target 14%  DIET: He is  watching his intake consistently now and overall cutting back on carbohydrate intake, on some days as low as 42 g His  average carbohydrate intake is now 94 compared to previous intake of 164   BOLUSES: He is bolusing 5.2 times a day, mostly with meals and snacks and does override them about 27 % of the time. Only 23 % with correction. Current daily basal is 59 units  GLUCOSE readings: Reviewed for the last 2 weeks, checking 3.5 times a day  PREMEAL Breakfast Lunch Dinner Bedtime Overall  Glucose range:  61-263   62-221   59-207   95-250    Mean/median:  140      136+/-67   Physical activity:Mostly working around [employer] Readings from Last 3 Encounters:  05/26/13 256 lb 3.2 oz (116.212 kg)  03/04/13 257 lb 14.4 oz (116.983 kg)  01/31/13 255 lb 1.6 oz (115.713 kg)    Lab Results  Component Value Date   HGBA1C 7.9* 05/23/2013   HGBA1C 7.7* 02/28/2013  HGBA1C 8.1* 12/03/2012   Lab Results  Component Value Date   MICROALBUR 0.5 05/23/2013   LDLCALC 68 05/23/2013   CREATININE 1.5 05/23/2013     Appointment on 05/23/2013  Component Date Value Ref Range Status  . Hemoglobin A1C 05/23/2013 7.9* 4.6 - 6.5 % Final   Glycemic Control Guidelines for People with Diabetes:Non Diabetic:  <6%Goal of Therapy: <7%Additional Action Suggested:  >8%   . Sodium 05/23/2013 137  135 - 145 mEq/L Final  . Potassium 05/23/2013 4.4  3.5 - 5.1 mEq/L Final  . Chloride 05/23/2013 105  96 - 112 mEq/L Final  . CO2 05/23/2013 25  19 - 32 mEq/L Final  .  Glucose, Bld 05/23/2013 122* 70 - 99 mg/dL Final  . BUN 05/23/2013 26* 6 - 23 mg/dL Final  . Creatinine, Ser 05/23/2013 1.5  0.4 - 1.5 mg/dL Final  . Total Bilirubin 05/23/2013 0.6  0.3 - 1.2 mg/dL Final  . Alkaline Phosphatase 05/23/2013 68  39 - 117 U/L Final  . AST 05/23/2013 18  0 - 37 U/L Final  . ALT 05/23/2013 23  0 - 53 U/L Final  . Total Protein 05/23/2013 6.7  6.0 - 8.3 g/dL Final  . Albumin 05/23/2013 4.0  3.5 - 5.2 g/dL Final  . Calcium 05/23/2013 8.9  8.4 - 10.5 mg/dL Final  . GFR 05/23/2013 49.47* >60.00 mL/min Final  . Cholesterol 05/23/2013 132  0 - 200 mg/dL Final   ATP III Classification       Desirable:  < 200 mg/dL               Borderline High:  200 - 239 mg/dL          High:  > = 240 mg/dL  . Triglycerides 05/23/2013 133.0  0.0 - 149.0 mg/dL Final   Normal:  <150 mg/dLBorderline High:  150 - 199 mg/dL  . HDL 05/23/2013 37.20* >39.00 mg/dL Final  . VLDL 05/23/2013 26.6  0.0 - 40.0 mg/dL Final  . LDL Cholesterol 05/23/2013 68  0 - 99 mg/dL Final  . Total CHOL/HDL Ratio 05/23/2013 4   Final                  Men          Women1/2 Average Risk     3.4          3.3Average Risk          5.0          4.42X Average Risk          9.6          7.13X Average Risk          15.0          11.0                      . Microalb, Ur 05/23/2013 0.5  0.0 - 1.9 mg/dL Final  . Creatinine,U 05/23/2013 112.8   Final  . Microalb Creat Ratio 05/23/2013 0.4  0.0 - 30.0 mg/g Final      Medication List       This list is accurate as of: 05/26/13  8:31 AM.  Always use your most recent med list.               amLODipine 5 MG tablet  Commonly known as:  NORVASC  Take 1 tablet (5 mg total) by mouth daily. Take 1 tablet daily  aspirin 81 MG tablet  Take 81 mg by mouth daily.     atorvastatin 20 MG tablet  Commonly known as:  LIPITOR  Take 1 tablet (20 mg total) by mouth daily.     Canagliflozin 300 MG Tabs  Commonly known as:  INVOKANA  Take 1 tablet (300 mg total) by mouth  daily.     glucose blood test strip  Commonly known as:  BAYER CONTOUR NEXT TEST  Use as instructed to check blood sugars 4 times per day dx code 250.03     insulin aspart 100 UNIT/ML injection  Commonly known as:  novoLOG  Inject 110 Units into the skin once. With pump     lisinopril 40 MG tablet  Commonly known as:  PRINIVIL,ZESTRIL     PARoxetine 40 MG tablet  Commonly known as:  PAXIL  Take 1 tablet (40 mg total) by mouth every morning.     testosterone 50 MG/5GM (1%) Gel  Commonly known as:  ANDROGEL  5 g. Use 1 tube daily as directed        Allergies: No Known Allergies  No past medical history on file.  No past surgical history on file.  Family History  Problem Relation Age of Onset  . Cancer Father     Prostate  . Diabetes Neg Hx     Social History:  reports that he has quit smoking. His smoking use included Cigarettes. He smoked 0.00 packs per day. He does not have any smokeless tobacco history on file. His alcohol and drug histories are not on file.  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 He has been on testosterone supplementation since about 2007. Currently on one tube of Testim and the last level was 400  HYPERTENSION: well controlled with lisinopril and amlodipine. Also his creatinine tends to be upper normal for no apparent reason  EXAM:   BP 124/62  Pulse 75  Temp(Src) 98.2 F (36.8 C)  Resp 16  Ht 5\' 7"  (1.702 m)  Wt 256 lb 3.2 oz (116.212 kg)  BMI 40.12 kg/m2  SpO2 96%   ASSESSMENT:  The blood sugars are  significantly better with improved  diet recently With cutting back on carbohydrates and snack she is having much better readings in the evenings including some tendency to hypoglycemia especially  with increased activity. He has reduced his basal rates likely See history of present illness for detailed discussion on current blood sugar patterns and problems identified His readings are most variable in the morning but recently low normal  Plan: Since his blood sugars are  relatively low waking up will reduce his 4 AM basal rate to 2.0 until 9 AM Discussed needing to check more readings around lunchtime and also 2 hours after various meals He will use a temporary basal for increased activity No change in carbohydrate ratio and less postprandial readings are unusually high or low Continue Invokana which had helped him before  HYPOGONADISM:  to have level checked on the next visit  HYPERTENSION: Blood pressure well controlled, creatinine  still upper normal. Consider reducing lisinopril  Total visit time including counseling = 25 minutes  Haelie Clapp 05/26/2013

## 2013-06-25 ENCOUNTER — Other Ambulatory Visit: Payer: Self-pay | Admitting: *Deleted

## 2013-06-25 MED ORDER — PAROXETINE HCL 40 MG PO TABS: 40.0000 mg | ORAL_TABLET | ORAL | Status: DC

## 2013-07-09 ENCOUNTER — Ambulatory Visit (INDEPENDENT_AMBULATORY_CARE_PROVIDER_SITE_OTHER): Payer: 59 | Admitting: Ophthalmology

## 2013-07-09 DIAGNOSIS — E11319 Type 2 diabetes mellitus with unspecified diabetic retinopathy without macular edema: Secondary | ICD-10-CM

## 2013-07-09 DIAGNOSIS — H2510 Age-related nuclear cataract, unspecified eye: Secondary | ICD-10-CM

## 2013-07-09 DIAGNOSIS — I10 Essential (primary) hypertension: Secondary | ICD-10-CM

## 2013-07-09 DIAGNOSIS — E1065 Type 1 diabetes mellitus with hyperglycemia: Secondary | ICD-10-CM

## 2013-07-09 DIAGNOSIS — D3130 Benign neoplasm of unspecified choroid: Secondary | ICD-10-CM

## 2013-07-09 DIAGNOSIS — E1039 Type 1 diabetes mellitus with other diabetic ophthalmic complication: Secondary | ICD-10-CM

## 2013-07-09 DIAGNOSIS — H43819 Vitreous degeneration, unspecified eye: Secondary | ICD-10-CM

## 2013-07-09 DIAGNOSIS — H35039 Hypertensive retinopathy, unspecified eye: Secondary | ICD-10-CM

## 2013-08-08 ENCOUNTER — Other Ambulatory Visit: Payer: Self-pay | Admitting: *Deleted

## 2013-08-08 MED ORDER — ATORVASTATIN CALCIUM 20 MG PO TABS: 20.0000 mg | ORAL_TABLET | Freq: Every day | ORAL | Status: DC

## 2013-08-19 ENCOUNTER — Other Ambulatory Visit: Payer: Self-pay | Admitting: *Deleted

## 2013-08-19 MED ORDER — CANAGLIFLOZIN 300 MG PO TABS: 300.0000 mg | ORAL_TABLET | Freq: Every day | ORAL | Status: DC

## 2013-08-19 MED ORDER — LISINOPRIL 40 MG PO TABS: 40.0000 mg | ORAL_TABLET | Freq: Every day | ORAL | Status: DC

## 2013-08-22 ENCOUNTER — Other Ambulatory Visit (INDEPENDENT_AMBULATORY_CARE_PROVIDER_SITE_OTHER): Payer: PRIVATE HEALTH INSURANCE

## 2013-08-22 DIAGNOSIS — IMO0002 Reserved for concepts with insufficient information to code with codable children: Secondary | ICD-10-CM

## 2013-08-22 DIAGNOSIS — E1065 Type 1 diabetes mellitus with hyperglycemia: Secondary | ICD-10-CM

## 2013-08-22 DIAGNOSIS — E291 Testicular hypofunction: Secondary | ICD-10-CM

## 2013-08-22 LAB — BASIC METABOLIC PANEL
BUN: 23 mg/dL (ref 6–23)
CALCIUM: 8.7 mg/dL (ref 8.4–10.5)
CO2: 26 mEq/L (ref 19–32)
CREATININE: 1.4 mg/dL (ref 0.4–1.5)
Chloride: 107 mEq/L (ref 96–112)
GFR: 54.84 mL/min — AB (ref >60.00)
GLUCOSE: 124 mg/dL — AB (ref 70–99)
POTASSIUM: 4.3 meq/L (ref 3.5–5.1)
Sodium: 139 mEq/L (ref 135–145)

## 2013-08-22 LAB — HEMOGLOBIN A1C: Hgb A1c MFr Bld: 7.9 % — ABNORMAL HIGH (ref 4.6–6.5)

## 2013-08-22 LAB — TESTOSTERONE: Testosterone: 519.13 ng/dL (ref 300.00–890.00)

## 2013-08-26 ENCOUNTER — Ambulatory Visit (INDEPENDENT_AMBULATORY_CARE_PROVIDER_SITE_OTHER): Payer: PRIVATE HEALTH INSURANCE | Admitting: Endocrinology

## 2013-08-26 ENCOUNTER — Encounter: Payer: Self-pay | Admitting: Endocrinology

## 2013-08-26 VITALS — BP 128/70 | HR 70 | Temp 98.0°F | Resp 16 | Ht 67.0 in | Wt 259.0 lb

## 2013-08-26 DIAGNOSIS — I10 Essential (primary) hypertension: Secondary | ICD-10-CM

## 2013-08-26 DIAGNOSIS — G4733 Obstructive sleep apnea (adult) (pediatric): Secondary | ICD-10-CM

## 2013-08-26 DIAGNOSIS — IMO0002 Reserved for concepts with insufficient information to code with codable children: Secondary | ICD-10-CM

## 2013-08-26 DIAGNOSIS — E291 Testicular hypofunction: Secondary | ICD-10-CM

## 2013-08-26 DIAGNOSIS — E1065 Type 1 diabetes mellitus with hyperglycemia: Secondary | ICD-10-CM

## 2013-08-26 NOTE — Patient Instructions (Addendum)
Check sugars 3-4 times daily  Check with Dr Sabino Donovan re: sleep apnea

## 2013-08-26 NOTE — Progress Notes (Signed)
Patient ID: Paul Adkins, male   DOB: [birth year], 68 y.o.   MRN: [id number]    Diagnosis: Type 1 diabetes, date of onset 1978  PAST history: He has had persistently poorly controlled diabetes for several years. A1c has been high with usual range 8.2 -9, overall improved since 2013. Compliance with glucose monitoring and diet has been variable and he does better when he is checking more sugars. His A1c was better than usual in 3/14 at 7.5 but he was also having significant hypoglycemia overnight and also occasionally later in the day. At that time he was started on Invokana 300 mg daily which helped him with glucose control and mild weight loss  CURRENT insulin pump brand:  Medtronic   PUMP SETTINGS are: Basal rates: 2.25 from midnight- 4 AM. 4 AM = 2.0. 11 a.m. = 2.6, 12:30 p.m. = 2.75, 6 p.m. = 2.75  Boluses : 1 unit for 8 g carbs at mealtimes and blood sugar target 110-120, sensitivity 1:30 until noon and then 1:25 with active insulin 3 hours.  Changing infusion set every 3-4 days  RECENT history:  He has not been checking his blood sugars as much as previously and mostly in the mornings especially recently and difficult to identify a pattern. His A1c has not improved as yet and is still near 8% On his last visit he had been trying a low carbohydrate diet but he is not watching his diet as much and has gained back 3 pounds This is despite continuing Force which appear to have helped previously including with his blood sugar control  Current blood sugar patterns and problems:  His fasting readings are somewhat variable although on an average not very high  Blood sugars tend to be higher late morning but not as much recently  Somewhat high readings around supper time which he has not checked very much recently  He may occasionally have readings in the 60s during the day which he thinks is from being more active at those times; he thinks that even with small amount of physical activity  with his home remodeling he gets low sugars and will tend to suspend his pump for short periods of time  Occasionally the bolus during the night probably for extra carbohydrate intake  Average reading 148, previously 136  Readings above target 56 % and below target 9 %  DIET: He is  not following any particular diet and his average daily carbohydrate intake is about 180 g now which is almost double of previous   Current daily basal is 59 units Changing infusion set every 3-4 days  GLUCOSE readings: Reviewed for the last 2 weeks, checking 2.4 times a day  PREMEAL Breakfast Lunch Dinner Bedtime Overall  Glucose range:  84-213   64-282   60-193   149    Mean/median:  136      148    Physical activity:Mostly working around [employer] Readings from Last 3 Encounters:  08/26/13 259 lb (117.482 kg)  05/26/13 256 lb 3.2 oz (116.212 kg)  03/04/13 257 lb 14.4 oz (116.983 kg)    Lab Results  Component Value Date   HGBA1C 7.9* 08/22/2013   HGBA1C 7.9* 05/23/2013   HGBA1C 7.7* 02/28/2013   Lab Results  Component Value Date   MICROALBUR 0.5 05/23/2013   [place] 68 05/23/2013   CREATININE 1.4 08/22/2013     Appointment on 08/22/2013  Component Date Value Ref Range Status  . Hemoglobin A1C 08/22/2013 7.9* 4.6 -  6.5 % Final   Glycemic Control Guidelines for People with Diabetes:Non Diabetic:  <6%Goal of Therapy: <7%Additional Action Suggested:  >8%   . Sodium 08/22/2013 139  135 - 145 mEq/L Final  . Potassium 08/22/2013 4.3  3.5 - 5.1 mEq/L Final  . Chloride 08/22/2013 107  96 - 112 mEq/L Final  . CO2 08/22/2013 26  19 - 32 mEq/L Final  . Glucose, Bld 08/22/2013 124* 70 - 99 mg/dL Final  . BUN 08/22/2013 23  6 - 23 mg/dL Final  . Creatinine, Ser 08/22/2013 1.4  0.4 - 1.5 mg/dL Final  . Calcium 08/22/2013 8.7  8.4 - 10.5 mg/dL Final  . GFR 08/22/2013 54.84* >60.00 mL/min Final  . Testosterone 08/22/2013 519.13  300.00 - 890.00 ng/dL Final      Medication List       This list is  accurate as of: 08/26/13  8:21 AM.  Always use your most recent med list.               amLODipine 5 MG tablet  Commonly known as:  NORVASC  Take 1 tablet (5 mg total) by mouth daily. Take 1 tablet daily     aspirin 81 MG tablet  Take 81 mg by mouth daily.     atorvastatin 20 MG tablet  Commonly known as:  LIPITOR  Take 1 tablet (20 mg total) by mouth daily.     Canagliflozin 300 MG Tabs  Commonly known as:  INVOKANA  Take 1 tablet (300 mg total) by mouth daily.     glucose blood test strip  Commonly known as:  BAYER CONTOUR NEXT TEST  Use as instructed to check blood sugars 4 times per day dx code 250.03     insulin aspart 100 UNIT/ML injection  Commonly known as:  novoLOG  Inject 110 Units into the skin once. With pump     lisinopril 40 MG tablet  Commonly known as:  PRINIVIL,ZESTRIL  Take 1 tablet (40 mg total) by mouth daily.     PARoxetine 40 MG tablet  Commonly known as:  PAXIL  Take 1 tablet (40 mg total) by mouth every morning.     testosterone 50 MG/5GM (1%) Gel  Commonly known as:  ANDROGEL  Place 5 g onto the skin daily. Use 1 tube daily as directed        Allergies: No Known Allergies  No past medical history on file.  No past surgical history on file.  Family History  Problem Relation Age of Onset  . Cancer Father     Prostate  . Diabetes Neg Hx     Social History:  reports that he has never smoked. He has never used smokeless tobacco. His alcohol and drug histories are not on file.  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 Currently on AndroGel and his preferred preparation from insurance company  keeps changing and the last level is normal  Lab Results  Component Value Date   TESTOSTERONE 519.13 08/22/2013    HYPERTENSION: well controlled with  lisinopril and amlodipine. Also his creatinine tends to be upper normal for no apparent reason  He is complaining of some fatigue and sleepiness during the daytime. This is despite using his sleep apnea regularly. However this was started several years ago and he has not had any followup No previous  history of hypothyroidism  No results found for this basename: TSH     EXAM:   BP 128/70  Pulse 70  Temp(Src) 98 F (36.7 C)  Resp 16  Ht 5\' 7"  (1.702 m)  Wt 259 lb (117.482 kg)  BMI 40.56 kg/m2  SpO2 98%  Trace ankle edema present  ASSESSMENT:  The blood sugars are  variable but since he has not monitored as much difficult to identify a consistent pattern Previously had better readings and reduced insulin requirement especially overnight with low carbohydrate diet A1c is still not improved See history of present illness for detailed discussion on current blood sugar patterns and problems identified His readings are somewhat variable in the morning but overall fairly good  Plan:  Start checking blood sugars more consistently throughout the day especially evening Call if blood sugars are consistently high at any given time He should try to reduce his carbohydrate intake again especially to prevent further weight gain Continue Invokana which had helped him before  HYPOGONADISM:  to continue same dose  HYPERTENSION: Blood pressure well controlled, creatinine  still upper normal. Consider reducing lisinopril  Mild edema: He may take Lasix occasionally if having excessive swelling  Daytime sleepiness: He needs to followup with his pulmonologist for sleep apnea, may need adjustment of his CPAP  Total visit time including counseling = 25 minutes  KUMAR,AJAY 08/26/2013

## 2013-08-27 ENCOUNTER — Encounter: Payer: Self-pay | Admitting: *Deleted

## 2013-08-27 LAB — HM DIABETES EYE EXAM

## 2013-09-22 ENCOUNTER — Other Ambulatory Visit: Payer: Self-pay | Admitting: *Deleted

## 2013-09-22 MED ORDER — TESTOSTERONE 50 MG/5GM (1%) TD GEL: 5.0000 g | Freq: Every day | TRANSDERMAL | Status: DC

## 2013-09-22 MED ORDER — AMLODIPINE BESYLATE 5 MG PO TABS
5.0000 mg | ORAL_TABLET | Freq: Every day | ORAL | Status: DC
Start: 2013-09-22 — End: 2014-05-26

## 2013-11-21 ENCOUNTER — Other Ambulatory Visit (INDEPENDENT_AMBULATORY_CARE_PROVIDER_SITE_OTHER): Payer: PRIVATE HEALTH INSURANCE

## 2013-11-21 DIAGNOSIS — IMO0002 Reserved for concepts with insufficient information to code with codable children: Secondary | ICD-10-CM

## 2013-11-21 DIAGNOSIS — E1065 Type 1 diabetes mellitus with hyperglycemia: Secondary | ICD-10-CM

## 2013-11-21 LAB — MICROALBUMIN / CREATININE URINE RATIO
CREATININE, U: 124.1 mg/dL
Microalb Creat Ratio: 0.6 mg/g (ref 0.0–30.0)
Microalb, Ur: 0.7 mg/dL (ref 0.0–1.9)

## 2013-11-21 LAB — COMPREHENSIVE METABOLIC PANEL
ALT: 19 U/L (ref 0–53)
AST: 16 U/L (ref 0–37)
Albumin: 3.7 g/dL (ref 3.5–5.2)
Alkaline Phosphatase: 66 U/L (ref 39–117)
BUN: 28 mg/dL — AB (ref 6–23)
CO2: 23 meq/L (ref 19–32)
Calcium: 8.8 mg/dL (ref 8.4–10.5)
Chloride: 108 mEq/L (ref 96–112)
Creatinine, Ser: 1.4 mg/dL (ref 0.4–1.5)
GFR: 53.02 mL/min — ABNORMAL LOW (ref >60.00)
Glucose, Bld: 92 mg/dL (ref 70–99)
Potassium: 4 mEq/L (ref 3.5–5.1)
SODIUM: 139 meq/L (ref 135–145)
TOTAL PROTEIN: 6.4 g/dL (ref 6.0–8.3)
Total Bilirubin: 0.7 mg/dL (ref 0.2–1.2)

## 2013-11-21 LAB — LIPID PANEL
Cholesterol: 121 mg/dL (ref 0–200)
HDL: 35.6 mg/dL — ABNORMAL LOW (ref >39.00)
LDL Cholesterol: 67 mg/dL (ref 0–99)
NONHDL: 85.4
TRIGLYCERIDES: 94 mg/dL (ref 0.0–149.0)
Total CHOL/HDL Ratio: 3
VLDL: 18.8 mg/dL (ref 0.0–40.0)

## 2013-11-21 LAB — HEMOGLOBIN A1C: Hgb A1c MFr Bld: 8.1 % — ABNORMAL HIGH (ref 4.6–6.5)

## 2013-11-26 ENCOUNTER — Encounter: Payer: Self-pay | Admitting: Endocrinology

## 2013-11-26 ENCOUNTER — Ambulatory Visit (INDEPENDENT_AMBULATORY_CARE_PROVIDER_SITE_OTHER): Payer: PRIVATE HEALTH INSURANCE | Admitting: Endocrinology

## 2013-11-26 VITALS — BP 134/60 | HR 70 | Temp 98.3°F | Resp 16 | Ht 67.0 in | Wt 258.6 lb

## 2013-11-26 DIAGNOSIS — E291 Testicular hypofunction: Secondary | ICD-10-CM

## 2013-11-26 DIAGNOSIS — Z23 Encounter for immunization: Secondary | ICD-10-CM

## 2013-11-26 DIAGNOSIS — IMO0002 Reserved for concepts with insufficient information to code with codable children: Secondary | ICD-10-CM

## 2013-11-26 DIAGNOSIS — Z125 Encounter for screening for malignant neoplasm of prostate: Secondary | ICD-10-CM

## 2013-11-26 DIAGNOSIS — I10 Essential (primary) hypertension: Secondary | ICD-10-CM

## 2013-11-26 DIAGNOSIS — N182 Chronic kidney disease, stage 2 (mild): Secondary | ICD-10-CM

## 2013-11-26 DIAGNOSIS — E1065 Type 1 diabetes mellitus with hyperglycemia: Secondary | ICD-10-CM

## 2013-11-26 NOTE — Patient Instructions (Signed)
Check more often especially at all meals and some at bedtime also

## 2013-11-26 NOTE — Progress Notes (Addendum)
Patient ID: Paul Adkins, male   DOB: [birth year], 68 y.o.   MRN: [id number]    Diagnosis: Type 1 diabetes, date of onset 1978  PAST history: He has had persistently poorly controlled diabetes for several years. A1c has been high with usual range 8.2 -9, overall improved since 2013. Compliance with glucose monitoring and diet has been variable and he does better when he is checking more sugars. His A1c was better than usual in 3/14 at 7.5 but he was also having significant hypoglycemia overnight and also occasionally later in the day. At that time he was started on Invokana 300 mg daily which helped him with glucose control and mild weight loss  CURRENT insulin pump brand:  Medtronic   PUMP SETTINGS are: Basal rates: 2.25 from midnight- 4 AM. 4 AM = 2.0. 11 a.m. = 2.6, 12:30 p.m. = 2.75, 6 p.m. = 2.75  Boluses : 1 unit for 8 g carbs at mealtimes and blood sugar target 110-120, sensitivity 1:30 until noon and then 1:25 with active insulin 3 hours.  Changing infusion set every 3-4 days  RECENT history:  He has  been checking his blood sugars 2-3 times a day although not consistently Most of his blood sugar monitoring is in the mornings before noon and difficult to identify consistent pattern His A1c has not improved as yet and is still around 8%  Current blood sugar patterns and problems:  Difficulty losing weight.  He has stopped exercising and does not have his exercise bike  He thinks he is following a low-fat diet usually but getting large portions and frequent snacks. His averaging 180 g, mostly in the evenings  Fasting readings are relatively good although relatively higher early morning  He appears to have somewhat higher readings late morning and he thinks that even with low carbohydrate breakfast his blood sugar seems to go up late morning  Blood sugars are mostly high after 7 PM although not checking regularly  Minimal hypoglycemia, only once had glucose of 65 late  afternoon  Average reading 164, previously 148 on download  He says that his insurance will not pay for his contour strips  Readings above target 64 % and below target 3 %  DIET: He is  not following any particular diet and his average daily carbohydrate intake is about 180 g now which is almost double of previous   Current daily basal is 59 units Changing infusion set every 3-4 days  GLUCOSE readings: Reviewed for the last 2 weeks, checking 2.4 times a day  PREMEAL  a.m.  Lunch Dinner Bedtime Overall  Glucose range:  92-218   93-175   92   167-362    Mean:  138     235   164+/-63     Physical activity: None recently  Wt Readings from Last 3 Encounters:  11/26/13 258 lb 9.6 oz (117.3 kg)  08/26/13 259 lb (117.482 kg)  05/26/13 256 lb 3.2 oz (116.212 kg)    Lab Results  Component Value Date   HGBA1C 8.1* 11/21/2013   HGBA1C 7.9* 08/22/2013   HGBA1C 7.9* 05/23/2013   Lab Results  Component Value Date   MICROALBUR 0.7 11/21/2013   LDLCALC 67 11/21/2013   CREATININE 1.4 11/21/2013     Appointment on 11/21/2013  Component Date Value Ref Range Status  . Hemoglobin A1C 11/21/2013 8.1* 4.6 - 6.5 % Final   Glycemic Control Guidelines for People with Diabetes:Non Diabetic:  <6%Goal of Therapy: <7%Additional Action Suggested:  >8%   .  Sodium 11/21/2013 139  135 - 145 mEq/L Final  . Potassium 11/21/2013 4.0  3.5 - 5.1 mEq/L Final  . Chloride 11/21/2013 108  96 - 112 mEq/L Final  . CO2 11/21/2013 23  19 - 32 mEq/L Final  . Glucose, Bld 11/21/2013 92  70 - 99 mg/dL Final  . BUN 11/21/2013 28* 6 - 23 mg/dL Final  . Creatinine, Ser 11/21/2013 1.4  0.4 - 1.5 mg/dL Final  . Total Bilirubin 11/21/2013 0.7  0.2 - 1.2 mg/dL Final  . Alkaline Phosphatase 11/21/2013 66  39 - 117 U/L Final  . AST 11/21/2013 16  0 - 37 U/L Final  . ALT 11/21/2013 19  0 - 53 U/L Final  . Total Protein 11/21/2013 6.4  6.0 - 8.3 g/dL Final  . Albumin 11/21/2013 3.7  3.5 - 5.2 g/dL Final  . Calcium 11/21/2013  8.8  8.4 - 10.5 mg/dL Final  . GFR 11/21/2013 53.02* >60.00 mL/min Final  . Cholesterol 11/21/2013 121  0 - 200 mg/dL Final   ATP III Classification       Desirable:  < 200 mg/dL               Borderline High:  200 - 239 mg/dL          High:  > = 240 mg/dL  . Triglycerides 11/21/2013 94.0  0.0 - 149.0 mg/dL Final   Normal:  <150 mg/dLBorderline High:  150 - 199 mg/dL  . HDL 11/21/2013 35.60* >39.00 mg/dL Final  . VLDL 11/21/2013 18.8  0.0 - 40.0 mg/dL Final  . LDL Cholesterol 11/21/2013 67  0 - 99 mg/dL Final  . Total CHOL/HDL Ratio 11/21/2013 3   Final                  Men          Women1/2 Average Risk     3.4          3.3Average Risk          5.0          4.42X Average Risk          9.6          7.13X Average Risk          15.0          11.0                      . NonHDL 11/21/2013 85.40   Final   NOTE:  Non-HDL goal should be 30 mg/dL higher than patient's LDL goal (i.e. LDL goal of < 70 mg/dL, would have non-HDL goal of < 100 mg/dL)  . Microalb, Ur 11/21/2013 0.7  0.0 - 1.9 mg/dL Final  . Creatinine,U 11/21/2013 124.1   Final  . Microalb Creat Ratio 11/21/2013 0.6  0.0 - 30.0 mg/g Final      Medication List       This list is accurate as of: 11/26/13  8:50 AM.  Always use your most recent med list.               amLODipine 5 MG tablet  Commonly known as:  NORVASC  Take 1 tablet (5 mg total) by mouth daily. Take 1 tablet daily     aspirin 81 MG tablet  Take 81 mg by mouth daily.     atorvastatin 20 MG tablet  Commonly known as:  LIPITOR  Take 1 tablet (20 mg total) by mouth  daily.     Canagliflozin 300 MG Tabs  Commonly known as:  INVOKANA  Take 1 tablet (300 mg total) by mouth daily.     glucose blood test strip  Commonly known as:  BAYER CONTOUR NEXT TEST  Use as instructed to check blood sugars 4 times per day dx code 250.03     insulin aspart 100 UNIT/ML injection  Commonly known as:  novoLOG  Inject 110 Units into the skin once. With pump     ketorolac 0.5 %  ophthalmic solution  Commonly known as:  ACULAR     lisinopril 40 MG tablet  Commonly known as:  PRINIVIL,ZESTRIL  Take 1 tablet (40 mg total) by mouth daily.     ofloxacin 0.3 % ophthalmic solution  Commonly known as:  OCUFLOX     PARoxetine 40 MG tablet  Commonly known as:  PAXIL  Take 1 tablet (40 mg total) by mouth every morning.     prednisoLONE acetate 1 % ophthalmic suspension  Commonly known as:  PRED FORTE     testosterone 50 MG/5GM (1%) Gel  Commonly known as:  ANDROGEL  Place 5 g onto the skin daily. Use 1 tube daily as directed        Allergies:  Allergies  Allergen Reactions  . Viagra [Sildenafil Citrate]     Past Medical History  Diagnosis Date  . Diabetes mellitus without complication   . ED (erectile dysfunction)   . Neuropathy   . Hypertension   . Hyperlipidemia   . Depression   . Carpal tunnel syndrome   . Edema   . Lumbar disc disease     Past Surgical History  Procedure Laterality Date  . Back surgery    . Vasectomy      Family History  Problem Relation Age of Onset  . Cancer Father     Prostate  . Diabetes Neg Hx     Social History:  reports that he has never smoked. He has never used smokeless tobacco. His alcohol and drug histories are not on file.  REVIEW of systems:   Eye exam 5/15, previously has had retinopathy  Asking about PSA testing and discussed current Society guidelines. 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  Currently on AndroGel and his preferred preparation from insurance company  keeps changing; the last level is therapeutic  Lab Results  Component Value Date   TESTOSTERONE 519.13 08/22/2013    HYPERTENSION: well controlled with lisinopril and amlodipine. Also his creatinine tends to be upper normal for no apparent reason,  stable  Hypercholesterolemia: Well controlled  Lab Results  Component Value Date   CHOL 121 11/21/2013   HDL 35.60* 11/21/2013   LDLCALC 67 11/21/2013   TRIG 94.0 11/21/2013   CHOLHDL 3 11/21/2013    EXAM:   BP 134/60  Pulse 70  Temp(Src) 98.3 F (36.8 C)  Resp 16  Ht 5\' 7"  (1.702 m)  Wt 258 lb 9.6 oz (117.3 kg)  BMI 40.49 kg/m2  SpO2 97%   ASSESSMENT:  The blood sugars are overall not well controlled with A1c over 8% This is despite taking Invokana Although his blood sugars are relatively good at breakfast time he probably has high readings after meals and snacks  Somewhat higher readings probably related to decreased activity recently See history of present illness for detailed discussion  on current blood sugar patterns and problems identified Currently because of and consistent monitoring does not have many readings to evaluate; mostly has high readings late evening  Plan:  Start checking blood sugars more consistently throughout the day especially evening and bedtime Increase basal rate at 8 AM to 2.6 and at 6 PM to 2.9; this may need to be adjusted further and discussed blood sugar targets Call if blood sugars are consistently high at any given time Resume exercise Continue Invokana for now, will need to continue monitoring of function He will start looking into continuous glucose monitoring again also given him information on Medtronic connect Prior authorization may be needed for his strips  HYPOGONADISM:  to continue same dose  HYPERTENSION: Blood pressure well controlled, creatinine  still upper normal.    Complete exam on next visit including PSA  Total visit time including counseling = 25 minutes  Saad Buhl 11/26/2013

## 2013-12-18 ENCOUNTER — Other Ambulatory Visit: Payer: Self-pay | Admitting: *Deleted

## 2013-12-18 MED ORDER — PAROXETINE HCL 40 MG PO TABS: 40.0000 mg | ORAL_TABLET | ORAL | Status: DC

## 2013-12-22 ENCOUNTER — Other Ambulatory Visit: Payer: Self-pay | Admitting: *Deleted

## 2013-12-22 MED ORDER — INSULIN ASPART 100 UNIT/ML ~~LOC~~ SOLN: 110.0000 [IU] | Freq: Once | SUBCUTANEOUS | Status: DC

## 2014-01-26 ENCOUNTER — Other Ambulatory Visit: Payer: Self-pay | Admitting: *Deleted

## 2014-01-26 MED ORDER — GLUCOSE BLOOD VI STRP: ORAL_STRIP | Status: AC

## 2014-02-16 ENCOUNTER — Other Ambulatory Visit: Payer: Self-pay | Admitting: *Deleted

## 2014-02-16 MED ORDER — CANAGLIFLOZIN 300 MG PO TABS: 300.0000 mg | ORAL_TABLET | Freq: Every day | ORAL | Status: DC

## 2014-02-16 MED ORDER — LISINOPRIL 40 MG PO TABS: 40.0000 mg | ORAL_TABLET | Freq: Every day | ORAL | Status: DC

## 2014-02-22 ENCOUNTER — Encounter: Payer: Self-pay | Admitting: Endocrinology

## 2014-02-22 DIAGNOSIS — Z0279 Encounter for issue of other medical certificate: Secondary | ICD-10-CM

## 2014-02-23 ENCOUNTER — Other Ambulatory Visit (INDEPENDENT_AMBULATORY_CARE_PROVIDER_SITE_OTHER): Payer: PRIVATE HEALTH INSURANCE

## 2014-02-23 DIAGNOSIS — IMO0002 Reserved for concepts with insufficient information to code with codable children: Secondary | ICD-10-CM

## 2014-02-23 DIAGNOSIS — N182 Chronic kidney disease, stage 2 (mild): Secondary | ICD-10-CM

## 2014-02-23 DIAGNOSIS — Z125 Encounter for screening for malignant neoplasm of prostate: Secondary | ICD-10-CM

## 2014-02-23 DIAGNOSIS — E1065 Type 1 diabetes mellitus with hyperglycemia: Secondary | ICD-10-CM

## 2014-02-23 LAB — COMPREHENSIVE METABOLIC PANEL
ALT: 26 U/L (ref 0–53)
AST: 18 U/L (ref 0–37)
Albumin: 3.5 g/dL (ref 3.5–5.2)
Alkaline Phosphatase: 71 U/L (ref 39–117)
BUN: 24 mg/dL — ABNORMAL HIGH (ref 6–23)
CALCIUM: 8.3 mg/dL — AB (ref 8.4–10.5)
CO2: 25 meq/L (ref 19–32)
Chloride: 109 mEq/L (ref 96–112)
Creatinine, Ser: 1.3 mg/dL (ref 0.4–1.5)
GFR: 60.82 mL/min (ref >60.00)
Glucose, Bld: 104 mg/dL — ABNORMAL HIGH (ref 70–99)
Potassium: 4.3 mEq/L (ref 3.5–5.1)
SODIUM: 139 meq/L (ref 135–145)
TOTAL PROTEIN: 5.9 g/dL — AB (ref 6.0–8.3)
Total Bilirubin: 0.7 mg/dL (ref 0.2–1.2)

## 2014-02-23 LAB — CBC
HEMATOCRIT: 42.1 % (ref 39.0–52.0)
Hemoglobin: 13.9 g/dL (ref 13.0–17.0)
MCHC: 33.1 g/dL (ref 30.0–36.0)
MCV: 87.8 fl (ref 78.0–100.0)
PLATELETS: 247 10*3/uL (ref 150.0–400.0)
RBC: 4.79 Mil/uL (ref 4.22–5.81)
RDW: 13.8 % (ref 11.5–15.5)
WBC: 5.7 10*3/uL (ref 4.0–10.5)

## 2014-02-23 LAB — PSA, MEDICARE: PSA: 0.36 ng/ml (ref 0.10–4.00)

## 2014-02-23 LAB — HEMOGLOBIN A1C: Hgb A1c MFr Bld: 8.6 % — ABNORMAL HIGH (ref 4.6–6.5)

## 2014-02-23 LAB — TSH: TSH: 1.95 u[IU]/mL (ref 0.35–4.50)

## 2014-02-25 ENCOUNTER — Encounter: Payer: Self-pay | Admitting: Endocrinology

## 2014-02-25 ENCOUNTER — Telehealth: Payer: Self-pay | Admitting: Endocrinology

## 2014-02-25 ENCOUNTER — Ambulatory Visit (INDEPENDENT_AMBULATORY_CARE_PROVIDER_SITE_OTHER): Payer: PRIVATE HEALTH INSURANCE | Admitting: Endocrinology

## 2014-02-25 VITALS — BP 134/66 | HR 80 | Temp 98.2°F | Resp 14 | Ht 67.0 in | Wt 260.0 lb

## 2014-02-25 DIAGNOSIS — E230 Hypopituitarism: Secondary | ICD-10-CM

## 2014-02-25 DIAGNOSIS — Z23 Encounter for immunization: Secondary | ICD-10-CM

## 2014-02-25 DIAGNOSIS — F988 Other specified behavioral and emotional disorders with onset usually occurring in childhood and adolescence: Secondary | ICD-10-CM

## 2014-02-25 DIAGNOSIS — I10 Essential (primary) hypertension: Secondary | ICD-10-CM

## 2014-02-25 DIAGNOSIS — F900 Attention-deficit hyperactivity disorder, predominantly inattentive type: Secondary | ICD-10-CM

## 2014-02-25 DIAGNOSIS — E1065 Type 1 diabetes mellitus with hyperglycemia: Secondary | ICD-10-CM

## 2014-02-25 DIAGNOSIS — IMO0002 Reserved for concepts with insufficient information to code with codable children: Secondary | ICD-10-CM

## 2014-02-25 MED ORDER — METHYLPHENIDATE HCL ER 20 MG PO TBCR: 20.0000 mg | EXTENDED_RELEASE_TABLET | Freq: Every day | ORAL | Status: DC

## 2014-02-25 NOTE — Progress Notes (Signed)
Patient ID: Paul Adkins, male   DOB: Jan 02, 1946, 68 y.o.   MRN: [id number]    Reason for visit: Diabetes follow-up and complete physical exam   Problem 1: Type 1 diabetes, date of onset 1978  PAST history: He has had persistently poorly controlled diabetes for several years. A1c has been high with usual range 8.2 -9, overall improved since 2013. Compliance with glucose monitoring and diet has been variable and he does better when he is checking more sugars. His A1c was better than usual in 3/14 at 7.5 but he was also having significant hypoglycemia overnight and also occasionally later in the day. At that time he was started on Invokana 300 mg daily which helped him with glucose control and mild weight loss  CURRENT insulin pump brand:  Medtronic   PUMP SETTINGS are: Basal rates: 2.25 from midnight- 4 AM. 4 AM = 2.0.  8:30 AM = 2.6  12:30 p.m. = 2.75 Boluses : 1 unit for 8 g carbs at mealtimes and blood sugar target 110-120, sensitivity 1:30 until noon and then 1:25 with active insulin 3 hours.  Changing infusion set every 3-4 days  RECENT history:  His blood sugars appear to be worse with A1c going up to nearly 9% On his last visit basal rate was increased at 8 AM, no other changes made Current blood sugar patterns and problems:  Difficulty getting good control for various reasons. This is likely from his going off his diet again and not monitoring his blood sugars as much  He has checked his blood sugars sporadically in the morning and not always before his breakfast and a few times only in the evenings or late at night.  Rarely will forget to bolus when he is eating late at night and last night blood sugar was over 300  He has increased his carbohydrate intake and at times is getting as much as 314 g in a day.  Also getting high fat meals or snacks.  He has been exercising very little despite instructions and only doing some toning  Blood sugars are not very consistent in the  mornings  Difficult to know his postprandial patterns as he is sometimes having multiple intakes of food all evening long  Current daily basal is 60 units Changing infusion set every 3-4 days  GLUCOSE readings: Reviewed for the last 2 weeks, checking 1.5 times a day on average  PRE-MEAL Breakfast Lunch Dinner Bedtime Overall  Glucose range:  95-152   123-234   185, 204   185-312    Mean/median:      193+/-68    DIET: He is  not following any particular diet and his average daily carbohydrate intake is about 203 g now which is more than before  Physical activity: None recently  Wt Readings from Last 3 Encounters:  02/25/14 260 lb (117.935 kg)  11/26/13 258 lb 9.6 oz (117.3 kg)  08/26/13 259 lb (117.482 kg)    Lab Results  Component Value Date   HGBA1C 8.6* 02/23/2014   HGBA1C 8.1* 11/21/2013   HGBA1C 7.9* 08/22/2013   Lab Results  Component Value Date   MICROALBUR 0.7 11/21/2013   LDLCALC 67 11/21/2013   CREATININE 1.3 02/23/2014     Appointment on 02/23/2014  Component Date Value Ref Range Status  . Hgb A1c MFr Bld 02/23/2014 8.6* 4.6 - 6.5 % Final   Glycemic Control Guidelines for People with Diabetes:Non Diabetic:  <6%Goal of Therapy: <7%Additional Action Suggested:  >8%   .  Sodium 02/23/2014 139  135 - 145 mEq/L Final  . Potassium 02/23/2014 4.3  3.5 - 5.1 mEq/L Final  . Chloride 02/23/2014 109  96 - 112 mEq/L Final  . CO2 02/23/2014 25  19 - 32 mEq/L Final  . Glucose, Bld 02/23/2014 104* 70 - 99 mg/dL Final  . BUN 02/23/2014 24* 6 - 23 mg/dL Final  . Creatinine, Ser 02/23/2014 1.3  0.4 - 1.5 mg/dL Final  . Total Bilirubin 02/23/2014 0.7  0.2 - 1.2 mg/dL Final  . Alkaline Phosphatase 02/23/2014 71  39 - 117 U/L Final  . AST 02/23/2014 18  0 - 37 U/L Final  . ALT 02/23/2014 26  0 - 53 U/L Final  . Total Protein 02/23/2014 5.9* 6.0 - 8.3 g/dL Final  . Albumin 02/23/2014 3.5  3.5 - 5.2 g/dL Final  . Calcium 02/23/2014 8.3* 8.4 - 10.5 mg/dL Final  . GFR 02/23/2014  60.82  >60.00 mL/min Final  . WBC 02/23/2014 5.7  4.0 - 10.5 K/uL Final  . RBC 02/23/2014 4.79  4.22 - 5.81 Mil/uL Final  . Platelets 02/23/2014 247.0  150.0 - 400.0 K/uL Final  . Hemoglobin 02/23/2014 13.9  13.0 - 17.0 g/dL Final  . HCT 02/23/2014 42.1  39.0 - 52.0 % Final  . MCV 02/23/2014 87.8  78.0 - 100.0 fl Final  . MCHC 02/23/2014 33.1  30.0 - 36.0 g/dL Final  . RDW 02/23/2014 13.8  11.5 - 15.5 % Final  . TSH 02/23/2014 1.95  0.35 - 4.50 uIU/mL Final  . PSA 02/23/2014 0.36  0.10 - 4.00 ng/ml Final      Medication List       This list is accurate as of: 02/25/14  8:04 AM.  Always use your most recent med list.               amLODipine 5 MG tablet  Commonly known as:  NORVASC  Take 1 tablet (5 mg total) by mouth daily. Take 1 tablet daily     aspirin 81 MG tablet  Take 81 mg by mouth daily.     atorvastatin 20 MG tablet  Commonly known as:  LIPITOR  Take 1 tablet (20 mg total) by mouth daily.     canagliflozin 300 MG Tabs tablet  Commonly known as:  INVOKANA  Take 300 mg by mouth daily.     glucose blood test strip  Commonly known as:  BAYER CONTOUR NEXT TEST  Use as instructed to check blood sugars 4 times per day dx code E10.65     insulin aspart 100 UNIT/ML injection  Commonly known as:  novoLOG  Inject 110 Units into the skin once. With pump     ketorolac 0.5 % ophthalmic solution  Commonly known as:  ACULAR     lisinopril 40 MG tablet  Commonly known as:  PRINIVIL,ZESTRIL  Take 1 tablet (40 mg total) by mouth daily.     ofloxacin 0.3 % ophthalmic solution  Commonly known as:  OCUFLOX     PARoxetine 40 MG tablet  Commonly known as:  PAXIL  Take 1 tablet (40 mg total) by mouth every morning.     prednisoLONE acetate 1 % ophthalmic suspension  Commonly known as:  PRED FORTE     testosterone 50 MG/5GM (1%) Gel  Commonly known as:  ANDROGEL  Place 5 g onto the skin daily. Use 1 tube daily as directed        Allergies:  Allergies  Allergen  Reactions  .  Viagra [Sildenafil Citrate]     Past Medical History  Diagnosis Date  . Diabetes mellitus without complication   . ED (erectile dysfunction)   . Neuropathy   . Hypertension   . Hyperlipidemia   . Depression   . Carpal tunnel syndrome   . Edema   . Lumbar disc disease     Past Surgical History  Procedure Laterality Date  . Back surgery    . Vasectomy      Family History  Problem Relation Age of Onset  . Cancer Father     Prostate  . Diabetes Neg Hx     Social History:  reports that he has never smoked. He has never used smokeless tobacco. His alcohol and drug histories are not on file.  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  Currently on generic and his preferred preparation from insurance company  keeps changing; the last level is therapeutic  Lab Results  Component Value Date   TESTOSTERONE 519.13 08/22/2013    HYPERTENSION: well controlled with lisinopril and amlodipine. Also his creatinine tends to be upper normal for no apparent reason, stable  Hypercholesterolemia: Well controlled with Lipitor  Lab Results  Component Value Date   CHOL 121 11/21/2013   HDL 35.60* 11/21/2013   LDLCALC 67 11/21/2013   TRIG 94.0 11/21/2013   CHOLHDL 3 11/21/2013        ENT: Has dry mouth.   Skin:  Normal      Thyroid:  No cold or heat intolerance, does fatigue periodically. TSH normal       No chest pain on exertion.   No palpitations.     No leg muscle pain on walking.                      No swelling of feet.     No shortness of breath on exertion.    Has sleep apnea being treated with CPAP     Bowel habits:  No change.                    Heartburn/pain: none.       Rectal bleeding/black stools: Not present.  Last colonoscopy  2-3 years ago and was reportedly normal       Urinary stream: normal.  Nocturia: 1-2x. No hesitancy or urgency.     He has had erectile dysfunction since 1997, previously treated with prostaglandin injections and vacuum pump.    Apparently had severe headache with Viagra. Not sexually active now      No joint pains.  Does get some back or leg pain on bending and lifting  May get numbness in hands on driving or sleeping.  He thinks he had used a brace a few years ago especially when he was playing the piano  No numbness in his feet or legs but occasionally sharp pains may occur      Depression and insomnia controlled with Paxil 40 mg.  Has been on Paxil for several years, previously had seen psychiatrist. He also had been on Ritalin a few years ago to help his concentration and focus and he stopped this because of cost He does see a psychologist every 6 weeks or so and was recommended to start medication in that category  Scleredema  present on his upper back and has seen dermatologist  EXAM:   BP 134/66 mmHg  Pulse 80  Temp(Src) 98.2 F (36.8 C)  Resp 14  Ht 5\' 7"  (1.702 m)  Wt 260 lb (117.935 kg)  BMI 40.71 kg/m2  SpO2 98%  Physical Exam  HENT:  Mouth/Throat: Oropharynx is clear and moist.  Neck: No thyromegaly present.  Cardiovascular: Regular rhythm.  Exam reveals no gallop.   No murmur heard. Pulmonary/Chest: Breath sounds normal. He has no wheezes. He has no rales.  Abdominal: He exhibits no distension and no mass. There is no tenderness.  Genitourinary: Prostate normal. Rectal exam shows no mass. Prostate is not enlarged.  Musculoskeletal: He exhibits no edema.  Lymphadenopathy:    He has no cervical adenopathy.  Neurological:  Reflex Scores:      Bicep reflexes are 1+ on the right side and 1+ on the left side.      Achilles reflexes are 0 on the right side and 0 on the left side. Vibration sense moderately decreased bilaterally, somewhat more on the  right. Tinel's sign positive on the left  Skin: No rash noted.  Thickening of the skin present on the upper back.  Mild erythema diffusely on the upper front chest  Psychiatric: He has a normal mood and affect.  Vitals reviewed.  Diabetic foot exam: Decreased sensation distally on the toes and plantar surfaces, pedal pulses normal  ASSESSMENT:  1.  DIABETES type 1 with obesity The blood sugars are overall not well controlled with A1c still over 8% Most of his recent poor control is related to poor compliance with diet and glucose monitoring Although initially he did benefit from [facility] he is not getting as much benefit now Also because of inconsistent glucose monitoring difficult to evaluate his blood sugar patterns, appears to be having mostly high postprandial readings See history of present illness for detailed discussion on current blood sugar patterns and problems identified    2.  History of diabetic retinopathy followed regularly by ophthalmologist 3.  No evidence of nephropathy, normal urine microalbumin. 4.  Hypertension with good control  5.  Hypercholesterolemia, well-controlled with Lipitor, does still have relatively low HDL 6.  History of depression, well-controlled.  Has seen psychologist without much benefit especially with self-care regarding diabetes and diet.  He does have attention deficit disorder and overall did much better previously when he was taking Ritalin, this was stopped because of cost  7. Mild carpal tunnel syndrome, worse  on the left 8. 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  Diagnosed in  2007and is on adequate replacement therapy, Currently taking?  generic medication.   9. Mild  dependent pedal edema related to Norvasc 10. Lumbar disc disease with periodic radicular leg pains on standing or certain activities   11.  Erectile dysfunction from diabetes,  Currently not requiring medications.  Plan:  Start  checking blood sugars more consistently throughout the day  This will help him monitor his diet a little better and also take correction doses as needed No change in basal rate as yet Start regular exercise, he can start swimming if he joins the club Continue Invokana for now, will need to continue monitoring of function Since he is starting Ritalin he may be ordered to focus better on his day-to-day care and diet Consider continuous glucose monitoring and was given information today by nurse educator May consider dietitian consultation  ADD: He will start extended-release Ritalin.  Also consider psychiatry  consultation if having difficulties with medication adjustment.  Would prefer that he take a different SSRI than Paxil because of obesity  HYPOGONADISM:  to have a testosterone level checked on the next visit to monitor his medication  HYPERTENSION: Blood pressure well controlled, creatinine normal  No history of CAD, screening EKG to be done today  Low normal serum albumin of unclear etiology.  Did not have any proteinuria   Preventive care: He will continue regular eye and dental exams PSA normal Stool Hemoccult to be done today.  Has had colonoscopy Prevnar to be considered on the next visit DTaP to be done today Recommend a regular exercise Continue 81 mg aspirin  Total visit time including counseling = 60 minutes  Rolan Wrightsman 02/25/2014

## 2014-02-25 NOTE — Patient Instructions (Addendum)
Please check blood sugars at least half 3x daily, sometime about 2 hours after any meal and 5 times per week on waking up. Please bring blood sugar monitor to each visit  Exercise daily

## 2014-02-25 NOTE — Telephone Encounter (Signed)
The pharmacy calling regarding the metadate it either needs to be e scribed or a hard copy needs to be given to the pt it cannot be faxed

## 2014-04-09 ENCOUNTER — Other Ambulatory Visit: Payer: Self-pay | Admitting: *Deleted

## 2014-04-09 MED ORDER — ATORVASTATIN CALCIUM 20 MG PO TABS: 20.0000 mg | ORAL_TABLET | Freq: Every day | ORAL | Status: DC

## 2014-04-10 ENCOUNTER — Other Ambulatory Visit: Payer: Self-pay | Admitting: *Deleted

## 2014-04-10 MED ORDER — TESTOSTERONE 50 MG/5GM (1%) TD GEL: 5.0000 g | Freq: Every day | TRANSDERMAL | Status: DC

## 2014-04-20 ENCOUNTER — Encounter: Payer: Self-pay | Admitting: Endocrinology

## 2014-04-20 ENCOUNTER — Ambulatory Visit (INDEPENDENT_AMBULATORY_CARE_PROVIDER_SITE_OTHER): Payer: Medicare Other | Admitting: Endocrinology

## 2014-04-20 ENCOUNTER — Other Ambulatory Visit: Payer: Self-pay | Admitting: *Deleted

## 2014-04-20 ENCOUNTER — Ambulatory Visit
Admission: RE | Admit: 2014-04-20 | Discharge: 2014-04-20 | Disposition: A | Discharge status: Home / Self Care | Payer: Medicare Other | Source: Ambulatory Visit | Attending: Endocrinology | Admitting: Endocrinology

## 2014-04-20 VITALS — BP 135/65 | Temp 97.7°F

## 2014-04-20 DIAGNOSIS — J209 Acute bronchitis, unspecified: Secondary | ICD-10-CM

## 2014-04-20 MED ORDER — HYDROCOD POLST-CHLORPHEN POLST 10-8 MG/5ML PO LQCR
ORAL | Status: DC
Start: 2014-04-20 — End: 2014-04-20

## 2014-04-20 MED ORDER — LEVOFLOXACIN 500 MG PO TABS: 500.0000 mg | ORAL_TABLET | Freq: Every day | ORAL | Status: DC

## 2014-04-20 MED ORDER — ALBUTEROL SULFATE HFA 108 (90 BASE) MCG/ACT IN AERS: 2.0000 | INHALATION_SPRAY | RESPIRATORY_TRACT | Status: DC | PRN

## 2014-04-20 MED ORDER — HYDROCOD POLST-CHLORPHEN POLST 10-8 MG/5ML PO LQCR: ORAL | Status: DC

## 2014-04-20 NOTE — Progress Notes (Signed)
Quick Note:  Please let patient know that the result is normal ______

## 2014-04-20 NOTE — Patient Instructions (Addendum)
Leave off Invokana till better

## 2014-04-20 NOTE — Progress Notes (Signed)
Subjective:     Patient ID: Paul Adkins, male   DOB: [birth year], 69 y.o.   MRN: [id number]  HPI    Chief complaint: cough and passing out    he says since Friday has been having significant amount of cough which has been coming in bouts.  He has had some wheezing but no shortness of breath.  He has had some brownish sputum and also has nasal congestion with some discolored nasal secretions also.  Has had mild sore throat which he thinks is more from coughing.  He says that when he has severe cough episodes he may pass out temporarily for a few seconds.  He has had some low-grade fever about 100.  He felt very weak and somewhat wobbly on Saturday without any blurred vision, numbness or unilateral weakness or speech difficulty   He thinks his blood sugars have been between 140 and 170 recently with the illness  Review of Systems     Objective:   Physical Exam  HENT:  Mouth/Throat: No oropharyngeal exudate or posterior oropharyngeal erythema.  Pulmonary/Chest: Effort normal.   Expiratory wheezing present in the posterior lung fields.  Some coarse crepitations at the bases especially left.  Mild dullness on the left base    his blood pressure is normal standing up. Gait is normal    BP 135/65 mmHg  Temp(Src) 97.7 F (36.5 C)  Assessment:       Probable acute bronchitis, may have pneumonia    He is having significant cough with  Some purulent sputum    His Passing out episodes are probably related to severe coughing episodes and possibly decreased blood pressure or cerebral circulation temporarily. Does not appear to have any neurological issues  now     Plan:       start Levaquin 500 mg daily for 7 days   chest x-ray   albuterol inhaler   Tussionex cough medication , may continue expectorant OTC   Leave off Invokana until she is better   monitor blood sugar closely and adjust insulin as needed   increase fluid intake

## 2014-04-22 ENCOUNTER — Telehealth: Payer: Self-pay | Admitting: Endocrinology

## 2014-04-22 NOTE — Telephone Encounter (Signed)
Patient states he is still feeing "Awful" and would like to know if the chest xray results are available    Please advise patient     Thank you

## 2014-04-22 NOTE — Telephone Encounter (Signed)
Contacted pt and advised that chest x-ray was normal. Pt voiced understanding.

## 2014-04-24 ENCOUNTER — Telehealth: Payer: Self-pay | Admitting: Endocrinology

## 2014-04-24 MED ORDER — PREDNISONE 20 MG PO TABS: ORAL_TABLET | ORAL | Status: DC

## 2014-04-24 MED ORDER — HYDROCOD POLST-CHLORPHEN POLST 10-8 MG/5ML PO LQCR: ORAL | Status: DC

## 2014-04-24 NOTE — Telephone Encounter (Signed)
Requested call back from pt to discuss.

## 2014-04-24 NOTE — Telephone Encounter (Signed)
Pt advised of note below and voiced understanding.  

## 2014-04-24 NOTE — Telephone Encounter (Signed)
See note below and please advise, Thanks! 

## 2014-04-24 NOTE — Telephone Encounter (Signed)
We can refill cough medication. Also he can take prednisone for 4 days: 20 mg daily, 10 mg the next 3 days which will help the cough.  However will make his sugars go up and he will have to increase his basal rate at least 50% If not better he can go to the urgent care center

## 2014-04-24 NOTE — Telephone Encounter (Signed)
What exact symptoms is he having?  His antibiotics are good until the 29th and does not need another prescription.  We can give him more cough medication

## 2014-04-24 NOTE — Telephone Encounter (Signed)
Patient called stating that he is still not feeling well   He would like a refill on his rx's  Hydrocodone cough syrup  Levofloxcin   Please advise   Thank You

## 2014-04-24 NOTE — Telephone Encounter (Signed)
Pt is stating the symptoms are exactly the same as what he had on Monday. The cough will not go away unless he takes the cough syrup.

## 2014-04-24 NOTE — Telephone Encounter (Signed)
See note below

## 2014-05-21 ENCOUNTER — Other Ambulatory Visit (INDEPENDENT_AMBULATORY_CARE_PROVIDER_SITE_OTHER): Payer: Medicare Other

## 2014-05-21 DIAGNOSIS — E230 Hypopituitarism: Secondary | ICD-10-CM | POA: Diagnosis not present

## 2014-05-21 DIAGNOSIS — E1065 Type 1 diabetes mellitus with hyperglycemia: Secondary | ICD-10-CM | POA: Diagnosis not present

## 2014-05-21 DIAGNOSIS — IMO0002 Reserved for concepts with insufficient information to code with codable children: Secondary | ICD-10-CM

## 2014-05-21 LAB — BASIC METABOLIC PANEL
BUN: 19 mg/dL (ref 6–23)
CALCIUM: 8.6 mg/dL (ref 8.4–10.5)
CHLORIDE: 107 meq/L (ref 96–112)
CO2: 27 meq/L (ref 19–32)
CREATININE: 1.19 mg/dL (ref 0.40–1.50)
GFR: 64.92 mL/min (ref >60.00)
GLUCOSE: 168 mg/dL — AB (ref 70–99)
POTASSIUM: 4 meq/L (ref 3.5–5.1)
Sodium: 140 mEq/L (ref 135–145)

## 2014-05-21 LAB — TESTOSTERONE: Testosterone: 375.11 ng/dL (ref 300.00–890.00)

## 2014-05-21 LAB — HEMOGLOBIN A1C: HEMOGLOBIN A1C: 9.4 % — AB (ref 4.6–6.5)

## 2014-05-26 ENCOUNTER — Other Ambulatory Visit: Payer: Self-pay | Admitting: *Deleted

## 2014-05-26 ENCOUNTER — Encounter: Payer: Self-pay | Admitting: Endocrinology

## 2014-05-26 ENCOUNTER — Ambulatory Visit (INDEPENDENT_AMBULATORY_CARE_PROVIDER_SITE_OTHER): Payer: Medicare Other | Admitting: Endocrinology

## 2014-05-26 VITALS — BP 142/82 | HR 68 | Temp 98.2°F | Resp 14 | Ht 67.0 in | Wt 252.8 lb

## 2014-05-26 DIAGNOSIS — I10 Essential (primary) hypertension: Secondary | ICD-10-CM | POA: Diagnosis not present

## 2014-05-26 DIAGNOSIS — E291 Testicular hypofunction: Secondary | ICD-10-CM | POA: Diagnosis not present

## 2014-05-26 DIAGNOSIS — IMO0002 Reserved for concepts with insufficient information to code with codable children: Secondary | ICD-10-CM

## 2014-05-26 DIAGNOSIS — E1065 Type 1 diabetes mellitus with hyperglycemia: Secondary | ICD-10-CM

## 2014-05-26 MED ORDER — AMLODIPINE BESYLATE 5 MG PO TABS: 5.0000 mg | ORAL_TABLET | Freq: Every day | ORAL | Status: DC

## 2014-05-26 NOTE — Progress Notes (Signed)
Patient ID: Paul Adkins, male   DOB: 1945-05-28, 69 y.o.   MRN: [id number]    Diagnosis: Type 1 diabetes, date of onset 1978  PAST history: He has had persistently poorly controlled diabetes for several years. A1c has been high with usual range 8.2 -9, overall improved since 2013. Compliance with glucose monitoring and diet has been variable and he does better when he is checking more sugars. His A1c was better than usual in 3/14 at 7.5 but he was also having significant hypoglycemia overnight and also occasionally later in the day. At that time he was started on Invokana 300 mg daily which helped him with glucose control and mild weight loss  CURRENT insulin pump brand:  Medtronic   PUMP SETTINGS are: Basal rates: 2.25 from midnight- 4 AM. 4 AM = 2.0. 11 a.m. = 2.6, 12:30 p.m. = 2.75, 6 p.m. = 2.75  Boluses : 1 unit for 8 g carbs at mealtimes and blood sugar target 110-120, sensitivity 1:30 until noon and then 1:25 with active insulin 3 hours.  Changing infusion set every 3-4 days  RECENT history:  His blood sugars are overall worse with A1c going up to 9.4%.  Current blood sugar patterns and problems:  Very sporadic blood sugar monitoring recently and most of his blood sugars were done about 10 days ago when he had infusion set issues and blood sugar is over 400 overnight twice.  FASTING blood sugars appear to be persistently high  Still not very motivated to watch his diet and eating almost 300 g of carbohydrates a day on some days  Difficulty losing weight, not controlling portions and snacks.  His Invokana was stopped temporarily when he had low blood pressure in February but he has not started this back again  He has done only very little exercise and this normally helps his glucose.  He had better readings on last Saturday when he was more active and blood sugars came down to normal in the evening and overnight  Insulin resistant statewith requiring nearly 100 units  of insulin a day  Readings above target 64 % and below target 3 %   DIET: He is  not following any particular diet and his average daily carbohydrate intake is about 200g now   Current daily basal is 59 units Changing infusion set every 3-4 days  GLUCOSE readings: Reviewed for the last 2 weeks  PRE-MEAL Breakfast Lunch Dinner Bedtime Overall  Glucose range: 140-314 146-373 67-291 327   Mean/median: 200    219   Physical activity: occasionally on exercise bike or yard work  [facility] Readings from Last 3 Encounters:  05/26/14 252 lb 12.8 oz (114.669 kg)  02/25/14 260 lb (117.935 kg)  11/26/13 258 lb 9.6 oz (117.3 kg)    Lab Results  Component Value Date   HGBA1C 9.4* 05/21/2014   HGBA1C 8.6* 02/23/2014   HGBA1C 8.1* 11/21/2013   Lab Results  Component Value Date   MICROALBUR 0.7 11/21/2013   LDLCALC 67 11/21/2013   CREATININE 1.19 05/21/2014     Lab on 05/21/2014  Component Date Value Ref Range Status  . Hgb A1c MFr Bld 05/21/2014 9.4* 4.6 - 6.5 % Final   Glycemic Control Guidelines for People with Diabetes:Non Diabetic:  <6%Goal of Therapy: <7%Additional Action Suggested:  >8%   . Sodium 05/21/2014 140  135 - 145 mEq/L Final  . Potassium 05/21/2014 4.0  3.5 - 5.1 mEq/L Final  . Chloride 05/21/2014 107  96 - 112  mEq/L Final  . CO2 05/21/2014 27  19 - 32 mEq/L Final  . Glucose, Bld 05/21/2014 168* 70 - 99 mg/dL Final  . BUN 05/21/2014 19  6 - 23 mg/dL Final  . Creatinine, Ser 05/21/2014 1.19  0.40 - 1.50 mg/dL Final  . Calcium 05/21/2014 8.6  8.4 - 10.5 mg/dL Final  . GFR 05/21/2014 64.92  >60.00 mL/min Final  . Testosterone 05/21/2014 375.11  300.00 - 890.00 ng/dL Final      Medication List       This list is accurate as of: 05/26/14  8:57 PM.  Always use your most recent med list.               amLODipine 5 MG tablet  Commonly known as:  NORVASC  Take 1 tablet (5 mg total) by mouth daily. Take 1 tablet daily     aspirin 81 MG tablet  Take 81 mg by mouth  daily.     atorvastatin 20 MG tablet  Commonly known as:  LIPITOR  Take 1 tablet (20 mg total) by mouth daily.     BOOSTRIX 5-2.5-18.5 LF-MCG/0.5 injection  Generic drug:  Tdap     canagliflozin 300 MG Tabs tablet  Commonly known as:  INVOKANA  Take 300 mg by mouth daily.     glucose blood test strip  Commonly known as:  BAYER CONTOUR NEXT TEST  Use as instructed to check blood sugars 4 times per day dx code E10.65     insulin aspart 100 UNIT/ML injection  Commonly known as:  novoLOG  Inject 110 Units into the skin once. With pump     ketorolac 0.5 % ophthalmic solution  Commonly known as:  ACULAR     lisinopril 40 MG tablet  Commonly known as:  PRINIVIL,ZESTRIL  Take 1 tablet (40 mg total) by mouth daily.     methylphenidate 20 MG ER tablet  Commonly known as:  METADATE ER  Take 1 tablet (20 mg total) by mouth daily.     ofloxacin 0.3 % ophthalmic solution  Commonly known as:  OCUFLOX     PARoxetine 40 MG tablet  Commonly known as:  PAXIL  Take 1 tablet (40 mg total) by mouth every morning.     prednisoLONE acetate 1 % ophthalmic suspension  Commonly known as:  PRED FORTE     predniSONE 20 MG tablet  Commonly known as:  DELTASONE  Take 20 mg for 4 days and 10 mg for 3 days     testosterone 50 MG/5GM (1%) Gel  Commonly known as:  ANDROGEL  Place 5 g onto the skin daily. Use 1 tube daily as directed        Allergies:  Allergies  Allergen Reactions  . Viagra [Sildenafil Citrate]     Past Medical History  Diagnosis Date  . Diabetes mellitus without complication   . ED (erectile dysfunction)   . Neuropathy   . Hypertension   . Hyperlipidemia   . Depression   . Carpal tunnel syndrome   . Edema   . Lumbar disc disease     Past Surgical History  Procedure Laterality Date  . Back surgery    . Vasectomy      Family History  Problem Relation Age of Onset  . Cancer Father     Prostate  . Diabetes Neg Hx     Social History:  reports that he has  never smoked. He has never used smokeless tobacco. His alcohol and drug  histories are not on file.  REVIEW of systems:   Eye exam 5/15, previously has had retinopathy  ADD: he was given a trial of Ritalin extended release and he does think it helps him concentrate and motivate him better but after about 2 days he gets more anxious and has taken it irregularly.  Reluctant to see a psychiatrist now  LEG numbness:He is asking about persistent numbness on his right lateral thigh which he thinks may have been going on for about a year.  Does not have any associated radiating pain or tingling and no symptoms of the lower leg. He did have some more radiating pain on the side when he was having severe coughing.  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  Currently on AndroGel 5 g packetsand his preferred preparation from [facility]  keeps changing; the last level is relatively low but he does not complain of any significant fatigue  Lab Results  Component Value Date   TESTOSTERONE 375.11 05/21/2014    HYPERTENSION: well controlled with lisinopril and amlodipine. Also his creatinine tends to be upper normal without evidence of proteinuria, stable  Lab Results  Component Value Date   CREATININE 1.19 05/21/2014   BUN 19 05/21/2014   NA 140 05/21/2014   K 4.0 05/21/2014   CL 107 05/21/2014   CO2 27 05/21/2014     Hypercholesterolemia: Well controlled on Lipitor  Lab Results  Component Value Date   CHOL 121 11/21/2013   HDL 35.60* 11/21/2013   LDLCALC 67 11/21/2013   TRIG 94.0 11/21/2013   CHOLHDL 3 11/21/2013    EXAM:   BP 142/82 mmHg  Pulse 68  Temp(Src) 98.2 F (36.8 C)  Resp 14  Ht 5\' 7"  (1.702 m)  Wt 252 lb 12.8 oz (114.669 kg)  BMI 39.58 kg/m2  SpO2 96%  He has decreased sensation on the right  lateral thigh. Straight leg raising test appears positive bilaterally at about 50 No tenderness of the lumbar spine Trace ankle edema  ASSESSMENT:  DIABETES: See history of present illness for detailed discussion on current blood sugar patterns and problems identified The blood sugars are overall not well controlled with A1c over 9% now He has not been compliant with diet, glucose monitoring, exercise or his Invokana  Plan:   Start checking blood sugars more consistently throughout the day especially evening and bedtime  Increase basal rate at 4 AM to 2.2  Call if blood sugars are consistently high at any given time  Resume exercise regularly  Resume Invokana for now, will need to continue monitoring of renal function  Follow-up in 6 weeks  LEG numbness: Not clear if this is related to a mononeuropathy or persistent radiculopathy from lumbar disc disease.  This has been chronically present and he is not interested in MRI at this time for nerve conduction studies  HYPOGONADISM:  to continue same dose of AndroGel as he is not symptomatic and his level is in the normal range  HYPERTENSION: Blood pressure well controlled, creatinine improved If creatinine increases with straight starting Invokana may need to reduce his lisinopril   ADD: He has improved with Ritalin but he tends to get anxiety after 2 days so he can try taking this 2 days and leaving the tablet on the third day Consider psychiatry consultation  Total visit time including counseling =  25 minutes  Caddie Randle 05/26/2014

## 2014-05-26 NOTE — Patient Instructions (Signed)
Try Ritalin 2 days on and 1 day off  Exercise daily  Check 4x daily

## 2014-06-26 ENCOUNTER — Other Ambulatory Visit: Payer: Self-pay | Admitting: *Deleted

## 2014-06-26 MED ORDER — PAROXETINE HCL 40 MG PO TABS: 40.0000 mg | ORAL_TABLET | ORAL | Status: DC

## 2014-07-07 ENCOUNTER — Ambulatory Visit (INDEPENDENT_AMBULATORY_CARE_PROVIDER_SITE_OTHER): Payer: Medicare Other | Admitting: Endocrinology

## 2014-07-07 ENCOUNTER — Encounter: Payer: Self-pay | Admitting: Endocrinology

## 2014-07-07 DIAGNOSIS — E1065 Type 1 diabetes mellitus with hyperglycemia: Secondary | ICD-10-CM

## 2014-07-07 DIAGNOSIS — IMO0002 Reserved for concepts with insufficient information to code with codable children: Secondary | ICD-10-CM

## 2014-07-07 LAB — BASIC METABOLIC PANEL
BUN: 30 mg/dL — ABNORMAL HIGH (ref 6–23)
CALCIUM: 8.8 mg/dL (ref 8.4–10.5)
CO2: 24 meq/L (ref 19–32)
CREATININE: 1.28 mg/dL (ref 0.40–1.50)
Chloride: 107 mEq/L (ref 96–112)
GFR: 59.66 mL/min — ABNORMAL LOW (ref >60.00)
GLUCOSE: 204 mg/dL — AB (ref 70–99)
POTASSIUM: 4.2 meq/L (ref 3.5–5.1)
Sodium: 137 mEq/L (ref 135–145)

## 2014-07-07 NOTE — Patient Instructions (Addendum)
Temp basal for increased activity  Check sugars 4x daily

## 2014-07-07 NOTE — Progress Notes (Signed)
Patient ID: Paul Adkins, male   DOB: April 19, 1945, 69 y.o.   MRN: [id number]    Diagnosis: Type 1 diabetes, date of onset 1978  PAST history: He has had persistently poorly controlled diabetes for several years. A1c has been high with usual range 8.2 -9, overall improved since 2013. Compliance with glucose monitoring and diet has been variable and he does better when he is checking more sugars. His A1c was better than usual in 3/14 at 7.5 but he was also having significant hypoglycemia overnight and also occasionally later in the day. At that time he was started on Invokana 300 mg daily which helped him with glucose control and mild weight loss  CURRENT insulin pump brand:  Medtronic   PUMP SETTINGS are: Basal rates: 2.25 from midnight- 4 AM. 4 AM = 2.0.  8:30 AM = 2.6.  12:30 p.m. = 2.75, 6 p.m. = 2.75  Boluses : 1 unit for 8 g carbs at mealtimes and blood sugar target 110-120, sensitivity 1:30 until noon and then 1:25 with active insulin 3 hours.  Changing infusion set every 3-4 days  RECENT history:  His blood sugars on his last visit were overall worse with A1c going up to 9.4%. This is partly related to noncompliance with diet, exercise as well as not restarting his Invokana He has started doing better with his diet and reducing of carbohydrate intake overall Also his early morning basal rate was increased  Current blood sugar patterns and problems:  He has done more frequent blood sugar monitoring recently although not consistently on any given day  His average blood sugar is now 142 compared to 219 on the last visit  His blood sugars are not consistently high but are relatively higher after 6 PM; has not checked many readings recently in the evenings  Fasting blood sugars are recently excellent without overnight hypoglycemia  He thinks that with increased activity such as vacuuming his whole house he will tend to get low blood sugars although has not documented any low  readings.  Has not used a temporary basal for this activity, has done this previously for exercise with good results  Frequently not checking his blood sugar at the time of boluses   DIET: He is  not following any particular diet and his average daily carbohydrate intake is about 200g now   Current daily basal is 60 units, recent overall average daily insulin 83 units Changing infusion set every 3-4 days  GLUCOSE readings: Reviewed for the last 2 weeks, on an average checking about 2 times a day  PRE-MEAL Breakfast Lunch Dinner  PCS  Overall  Glucose range:  71-236   104-186   120-218  ?     Mean/median:      142    Physical activity: occasionally with housecleaning or yard work  [facility] Readings from Last 3 Encounters:  07/07/14 261 lb 9.6 oz (118.661 kg)  05/26/14 252 lb 12.8 oz (114.669 kg)  02/25/14 260 lb (117.935 kg)    Lab Results  Component Value Date   HGBA1C 9.4* 05/21/2014   HGBA1C 8.6* 02/23/2014   HGBA1C 8.1* 11/21/2013   Lab Results  Component Value Date   MICROALBUR 0.7 11/21/2013   [place] 67 11/21/2013   CREATININE 1.19 05/21/2014     No visits with results within 1 Week(s) from this visit. Latest known visit with results is:  Lab on 05/21/2014  Component Date Value Ref Range Status  . Hgb A1c MFr Bld 05/21/2014  9.4* 4.6 - 6.5 % Final   Glycemic Control Guidelines for People with Diabetes:Non Diabetic:  <6%Goal of Therapy: <7%Additional Action Suggested:  >8%   . Sodium 05/21/2014 140  135 - 145 mEq/L Final  . Potassium 05/21/2014 4.0  3.5 - 5.1 mEq/L Final  . Chloride 05/21/2014 107  96 - 112 mEq/L Final  . CO2 05/21/2014 27  19 - 32 mEq/L Final  . Glucose, Bld 05/21/2014 168* 70 - 99 mg/dL Final  . BUN 05/21/2014 19  6 - 23 mg/dL Final  . Creatinine, Ser 05/21/2014 1.19  0.40 - 1.50 mg/dL Final  . Calcium 05/21/2014 8.6  8.4 - 10.5 mg/dL Final  . GFR 05/21/2014 64.92  >60.00 mL/min Final  . Testosterone 05/21/2014 375.11  300.00 - 890.00 ng/dL Final        Medication List       This list is accurate as of: 07/07/14  9:32 AM.  Always use your most recent med list.               amLODipine 5 MG tablet  Commonly known as:  NORVASC  Take 1 tablet (5 mg total) by mouth daily. Take 1 tablet daily     aspirin 81 MG tablet  Take 81 mg by mouth daily.     atorvastatin 20 MG tablet  Commonly known as:  LIPITOR  Take 1 tablet (20 mg total) by mouth daily.     BOOSTRIX 5-2.5-18.5 LF-MCG/0.5 injection  Generic drug:  Tdap     canagliflozin 300 MG Tabs tablet  Commonly known as:  INVOKANA  Take 300 mg by mouth daily.     glucose blood test strip  Commonly known as:  BAYER CONTOUR NEXT TEST  Use as instructed to check blood sugars 4 times per day dx code E10.65     insulin aspart 100 UNIT/ML injection  Commonly known as:  novoLOG  Inject 110 Units into the skin once. With pump     ketorolac 0.5 % ophthalmic solution  Commonly known as:  ACULAR     lisinopril 40 MG tablet  Commonly known as:  PRINIVIL,ZESTRIL  Take 1 tablet (40 mg total) by mouth daily.     methylphenidate 20 MG ER tablet  Commonly known as:  METADATE ER  Take 1 tablet (20 mg total) by mouth daily.     ofloxacin 0.3 % ophthalmic solution  Commonly known as:  OCUFLOX     PARoxetine 40 MG tablet  Commonly known as:  PAXIL  Take 1 tablet (40 mg total) by mouth every morning.     prednisoLONE acetate 1 % ophthalmic suspension  Commonly known as:  PRED FORTE     predniSONE 20 MG tablet  Commonly known as:  DELTASONE  Take 20 mg for 4 days and 10 mg for 3 days     testosterone 50 MG/5GM (1%) Gel  Commonly known as:  ANDROGEL  Place 5 g onto the skin daily. Use 1 tube daily as directed        Allergies:  Allergies  Allergen Reactions  . Viagra [Sildenafil Citrate]     Past Medical History  Diagnosis Date  . Diabetes mellitus without complication   . ED (erectile dysfunction)   . Neuropathy   . Hypertension   . Hyperlipidemia   .  Depression   . Carpal tunnel syndrome   . Edema   . Lumbar disc disease     Past Surgical History  Procedure Laterality Date  .  Back surgery    . Vasectomy      Family History  Problem Relation Age of Onset  . Cancer Father     Prostate  . Diabetes Neg Hx     Social History:  reports that he has never smoked. He has never used smokeless tobacco. His alcohol and drug histories are not on file.  REVIEW of systems:   Eye exam 5/15, previously has had retinopathy  ADD: he was given a trial of Ritalin extended release and he does think it helps him concentrate and motivate him better He is now taking this as directed for 2 days in a row and then leaving off it 1 day to avoid anxiety  LEG numbness:He is still having some  persistent numbness on his right lateral thigh which he thinks may have been going on for about a year.   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  Currently on AndroGel 5 g packetsand his preferred preparation from [facility]  keeps changing; the last level is relatively low but he does not complain of any significant fatigue  Lab Results  Component Value Date   TESTOSTERONE 375.11 05/21/2014    HYPERTENSION: well controlled with lisinopril and amlodipine. Also his creatinine tends to be upper normal without evidence of proteinuria, stable  Lab Results  Component Value Date   CREATININE 1.19 05/21/2014   BUN 19 05/21/2014   NA 140 05/21/2014   K 4.0 05/21/2014   CL 107 05/21/2014   CO2 27 05/21/2014     Hypercholesterolemia: Well controlled on Lipitor  Lab Results  Component Value Date   CHOL 121 11/21/2013   HDL 35.60* 11/21/2013   LDLCALC 67 11/21/2013   TRIG 94.0 11/21/2013   CHOLHDL 3 11/21/2013      EXAM:   BP 143/67 mmHg  Pulse 67  Temp(Src) 97.9 F (36.6 C)  Resp 16  Ht 5\' 7"  (1.702 m)  Wt 261 lb 9.6 oz (118.661 kg)  BMI 40.96 kg/m2  SpO2 98%   ASSESSMENT:  DIABETES: See history of present illness for detailed discussion on current blood sugar patterns and problems identified The blood sugars are overall looking better at least in the last 10 days with mostly good readings This has been probably with better control of his diet, some exercise and also restarting Invokana He has also cut back on his carbohydrate intake overall He can however check his blood sugars more consistently especially when bolusing for meals  Plan:   Start checking blood sugars more consistently throughout the day especially evening and bedtime  No change in pump settings as yet  Use temporary basal when planning to be active  Check renal function since he has started Invokana  Discussed that he needs to hold off on Invokana if he has any situation where he is acutely ill and not able to eat well  LEG/thigh numbness: 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

## 2014-07-07 NOTE — Progress Notes (Signed)
Quick Note:  Please let patient know that the kidney result is normal but glucose was 204, need to check more readings at home  ______

## 2014-07-16 ENCOUNTER — Ambulatory Visit (INDEPENDENT_AMBULATORY_CARE_PROVIDER_SITE_OTHER): Payer: Medicare Other | Admitting: Ophthalmology

## 2014-07-16 DIAGNOSIS — E10319 Type 1 diabetes mellitus with unspecified diabetic retinopathy without macular edema: Secondary | ICD-10-CM

## 2014-07-16 DIAGNOSIS — I10 Essential (primary) hypertension: Secondary | ICD-10-CM

## 2014-07-16 DIAGNOSIS — H35033 Hypertensive retinopathy, bilateral: Secondary | ICD-10-CM

## 2014-07-16 DIAGNOSIS — E10339 Type 1 diabetes mellitus with moderate nonproliferative diabetic retinopathy without macular edema: Secondary | ICD-10-CM

## 2014-07-16 DIAGNOSIS — H43813 Vitreous degeneration, bilateral: Secondary | ICD-10-CM

## 2014-07-16 DIAGNOSIS — E10329 Type 1 diabetes mellitus with mild nonproliferative diabetic retinopathy without macular edema: Secondary | ICD-10-CM

## 2014-07-16 DIAGNOSIS — D3132 Benign neoplasm of left choroid: Secondary | ICD-10-CM

## 2014-07-29 ENCOUNTER — Other Ambulatory Visit: Payer: Self-pay | Admitting: Endocrinology

## 2014-08-03 ENCOUNTER — Other Ambulatory Visit (INDEPENDENT_AMBULATORY_CARE_PROVIDER_SITE_OTHER): Payer: Medicare Other | Admitting: Ophthalmology

## 2014-08-03 DIAGNOSIS — E10311 Type 1 diabetes mellitus with unspecified diabetic retinopathy with macular edema: Secondary | ICD-10-CM

## 2014-08-03 DIAGNOSIS — E10351 Type 1 diabetes mellitus with proliferative diabetic retinopathy with macular edema: Secondary | ICD-10-CM | POA: Diagnosis not present

## 2014-08-22 ENCOUNTER — Other Ambulatory Visit: Payer: Self-pay | Admitting: Endocrinology

## 2014-08-26 ENCOUNTER — Other Ambulatory Visit: Payer: Self-pay | Admitting: Endocrinology

## 2014-09-20 ENCOUNTER — Other Ambulatory Visit: Payer: Self-pay | Admitting: Endocrinology

## 2014-10-05 ENCOUNTER — Other Ambulatory Visit (INDEPENDENT_AMBULATORY_CARE_PROVIDER_SITE_OTHER): Payer: Medicare Other

## 2014-10-05 DIAGNOSIS — E1065 Type 1 diabetes mellitus with hyperglycemia: Secondary | ICD-10-CM

## 2014-10-05 DIAGNOSIS — IMO0002 Reserved for concepts with insufficient information to code with codable children: Secondary | ICD-10-CM

## 2014-10-05 LAB — COMPREHENSIVE METABOLIC PANEL
ALK PHOS: 70 U/L (ref 39–117)
ALT: 19 U/L (ref 0–53)
AST: 14 U/L (ref 0–37)
Albumin: 3.8 g/dL (ref 3.5–5.2)
BUN: 27 mg/dL — ABNORMAL HIGH (ref 6–23)
CO2: 26 mEq/L (ref 19–32)
CREATININE: 1.35 mg/dL (ref 0.40–1.50)
Calcium: 9 mg/dL (ref 8.4–10.5)
Chloride: 109 mEq/L (ref 96–112)
GFR: 56.06 mL/min — ABNORMAL LOW (ref >60.00)
Glucose, Bld: 132 mg/dL — ABNORMAL HIGH (ref 70–99)
Potassium: 4.2 mEq/L (ref 3.5–5.1)
SODIUM: 140 meq/L (ref 135–145)
Total Bilirubin: 0.4 mg/dL (ref 0.2–1.2)
Total Protein: 6.4 g/dL (ref 6.0–8.3)

## 2014-10-05 LAB — LIPID PANEL
CHOL/HDL RATIO: 4
Cholesterol: 135 mg/dL (ref 0–200)
HDL: 36 mg/dL — ABNORMAL LOW (ref >39.00)
LDL Cholesterol: 78 mg/dL (ref 0–99)
NonHDL: 99.39
Triglycerides: 107 mg/dL (ref 0.0–149.0)
VLDL: 21.4 mg/dL (ref 0.0–40.0)

## 2014-10-05 LAB — MICROALBUMIN / CREATININE URINE RATIO
Creatinine,U: 82.1 mg/dL
Microalb Creat Ratio: 0.9 mg/g (ref 0.0–30.0)
Microalb, Ur: <0.7 mg/dL (ref 0.0–1.9)

## 2014-10-05 LAB — HEMOGLOBIN A1C: HEMOGLOBIN A1C: 7.9 % — AB (ref 4.6–6.5)

## 2014-10-08 ENCOUNTER — Other Ambulatory Visit: Payer: Self-pay | Admitting: *Deleted

## 2014-10-08 ENCOUNTER — Encounter: Payer: Self-pay | Admitting: Endocrinology

## 2014-10-08 ENCOUNTER — Ambulatory Visit (INDEPENDENT_AMBULATORY_CARE_PROVIDER_SITE_OTHER): Payer: Medicare Other | Admitting: Endocrinology

## 2014-10-08 VITALS — BP 150/78 | HR 69 | Temp 97.8°F | Resp 16 | Ht 67.0 in | Wt 266.0 lb

## 2014-10-08 DIAGNOSIS — E291 Testicular hypofunction: Secondary | ICD-10-CM

## 2014-10-08 DIAGNOSIS — F900 Attention-deficit hyperactivity disorder, predominantly inattentive type: Secondary | ICD-10-CM | POA: Diagnosis not present

## 2014-10-08 DIAGNOSIS — IMO0002 Reserved for concepts with insufficient information to code with codable children: Secondary | ICD-10-CM

## 2014-10-08 DIAGNOSIS — I10 Essential (primary) hypertension: Secondary | ICD-10-CM

## 2014-10-08 DIAGNOSIS — E1065 Type 1 diabetes mellitus with hyperglycemia: Secondary | ICD-10-CM | POA: Diagnosis not present

## 2014-10-08 DIAGNOSIS — F988 Other specified behavioral and emotional disorders with onset usually occurring in childhood and adolescence: Secondary | ICD-10-CM

## 2014-10-08 MED ORDER — LISINOPRIL-HYDROCHLOROTHIAZIDE 20-12.5 MG PO TABS: 1.0000 | ORAL_TABLET | Freq: Every day | ORAL | Status: DC

## 2014-10-08 NOTE — Patient Instructions (Addendum)
Check sugar 4x per day and more at night  Extend bolus and temp basal overnight for hi fat meals and snacks  Exercise daily  Ritalin every 2 days  Change lisinopril

## 2014-10-08 NOTE — Progress Notes (Signed)
Patient ID: Paul Adkins, male   DOB: 1945/08/18, 69 y.o.   MRN: [id number]    Diagnosis: Type 1 diabetes, date of onset 1978  PAST history: He has had persistently poorly controlled diabetes for several years. A1c has been high with usual range 8.2 -9, overall improved since 2013. Compliance with glucose monitoring and diet has been variable and he does better when he is checking more sugars. His A1c was better than usual in 3/14 at 7.5 but he was also having significant hypoglycemia overnight and also occasionally later in the day. At that time he was started on Invokana 300 mg daily which helped him with glucose control and mild weight loss  CURRENT insulin pump brand:  Medtronic   PUMP SETTINGS are: Basal rates: 2.25 from midnight- 4 AM. 4 AM = 2.0.  8:30 AM = 2.6.  12:30 p.m. = 2.75, 6 p.m. = 2.75  Boluses : 1 unit for 8 g carbs at mealtimes and blood sugar target 110-120, sensitivity 1:30 until noon and then 1:25 with active insulin 3 hours.  Changing infusion set every 3-4 days  RECENT history:  His blood sugars on his last visit were overall worse with A1c going up to 9.4%. His pump settings were not  change on the last visit  With restarting his Invokana his A1c has improved to 7.9 which is better than usual  Current blood sugar patterns and problems:  He has done less frequent blood sugar monitoring recently and not consistently on any given day  Since he does not check his blood sugars consistently there is no pattern  Fasting blood sugars are mildly increased on average but not consistently  Some of his high readings late at night and in the mornings be be related to higher fat meals and not extending his evening boluses for higher fat meals  Frequently not checking his blood sugar at the time of boluses  Despite starting Invokana he has gained weight He thinks he has not done any better with watching his diet and still having large amounts of carbohydrate  during the day and frequent snacks in the evenings. Also not motivated to exercise DIET: He is  not following any particular diet and his average daily carbohydrate intake is 224 g   Current daily basal is 60 units, recent overall average daily insulin 91 units with 66% in basal Changing infusion set every 3-4 days  GLUCOSE readings: Reviewed for the last 2 weeks, on an average checking on average less than 2 times a day  Mean values apply above for all meters except median for One Touch  PRE-MEAL Fasting Lunch Dinner Bedtime Overall  Glucose range:  106-205    167, 382  156-306    Mean/median:      164+/-66    POST-MEAL PC Breakfast PC Lunch PC Dinner  Glucose range:  145, 113   64, 180    Mean/median:      Physical activity: occasionally with house cleaning or yard work  [facility] Readings from Last 3 Encounters:  10/08/14 266 lb (120.657 kg)  07/07/14 261 lb 9.6 oz (118.661 kg)  05/26/14 252 lb 12.8 oz (114.669 kg)    Lab Results  Component Value Date   HGBA1C 7.9* 10/05/2014   HGBA1C 9.4* 05/21/2014   HGBA1C 8.6* 02/23/2014   Lab Results  Component Value Date   MICROALBUR <0.7 10/05/2014   LDLCALC 78 10/05/2014   CREATININE 1.35 10/05/2014     Lab on 10/05/2014  Component Date Value Ref Range Status  . Hgb A1c MFr Bld 10/05/2014 7.9* 4.6 - 6.5 % Final   Glycemic Control Guidelines for People with Diabetes:Non Diabetic:  <6%Goal of Therapy: <7%Additional Action Suggested:  >8%   . Sodium 10/05/2014 140  135 - 145 mEq/L Final  . Potassium 10/05/2014 4.2  3.5 - 5.1 mEq/L Final  . Chloride 10/05/2014 109  96 - 112 mEq/L Final  . CO2 10/05/2014 26  19 - 32 mEq/L Final  . Glucose, Bld 10/05/2014 132* 70 - 99 mg/dL Final  . BUN 10/05/2014 27* 6 - 23 mg/dL Final  . Creatinine, Ser 10/05/2014 1.35  0.40 - 1.50 mg/dL Final  . Total Bilirubin 10/05/2014 0.4  0.2 - 1.2 mg/dL Final  . Alkaline Phosphatase 10/05/2014 70  39 - 117 U/L Final  . AST 10/05/2014 14  0 - 37 U/L Final    . ALT 10/05/2014 19  0 - 53 U/L Final  . Total Protein 10/05/2014 6.4  6.0 - 8.3 g/dL Final  . Albumin 10/05/2014 3.8  3.5 - 5.2 g/dL Final  . Calcium 10/05/2014 9.0  8.4 - 10.5 mg/dL Final  . GFR 10/05/2014 56.06* >60.00 mL/min Final  . Cholesterol 10/05/2014 135  0 - 200 mg/dL Final   ATP III Classification       Desirable:  < 200 mg/dL               Borderline High:  200 - 239 mg/dL          High:  > = 240 mg/dL  . Triglycerides 10/05/2014 107.0  0.0 - 149.0 mg/dL Final   Normal:  <150 mg/dLBorderline High:  150 - 199 mg/dL  . HDL 10/05/2014 36.00* >39.00 mg/dL Final  . VLDL 10/05/2014 21.4  0.0 - 40.0 mg/dL Final  . LDL Cholesterol 10/05/2014 78  0 - 99 mg/dL Final  . Total CHOL/HDL Ratio 10/05/2014 4   Final                  Men          Women1/2 Average Risk     3.4          3.3Average Risk          5.0          4.42X Average Risk          9.6          7.13X Average Risk          15.0          11.0                      . NonHDL 10/05/2014 99.39   Final   NOTE:  Non-HDL goal should be 30 mg/dL higher than patient's LDL goal (i.e. LDL goal of < 70 mg/dL, would have non-HDL goal of < 100 mg/dL)  . Microalb, Ur 10/05/2014 <0.7  0.0 - 1.9 mg/dL Final  . Creatinine,U 10/05/2014 82.1   Final  . Microalb Creat Ratio 10/05/2014 0.9  0.0 - 30.0 mg/g Final      Medication List       This list is accurate as of: 10/08/14 11:59 PM.  Always use your most recent med list.               amLODipine 5 MG tablet  Commonly known as:  NORVASC  Take 1 tablet (5 mg total) by mouth daily. Take 1  tablet daily     aspirin 81 MG tablet  Take 81 mg by mouth daily.     atorvastatin 20 MG tablet  Commonly known as:  LIPITOR  Take 1 tablet (20 mg total) by mouth daily.     BOOSTRIX 5-2.5-18.5 LF-MCG/0.5 injection  Generic drug:  Tdap     canagliflozin 300 MG Tabs tablet  Commonly known as:  INVOKANA  Take 300 mg by mouth daily.     glucose blood test strip  Commonly known as:  BAYER CONTOUR  NEXT TEST  Use as instructed to check blood sugars 4 times per day dx code E10.65     ketorolac 0.5 % ophthalmic solution  Commonly known as:  ACULAR     lisinopril-hydrochlorothiazide 20-12.5 MG per tablet  Commonly known as:  ZESTORETIC  Take 1 tablet by mouth daily.     methylphenidate 20 MG ER tablet  Commonly known as:  METADATE ER  Take 1 tablet (20 mg total) by mouth daily.     NOVOLOG 100 UNIT/ML injection  Generic drug:  insulin aspart  INJECT 110 UNITS VIA PUMP EVERY DAY     ofloxacin 0.3 % ophthalmic solution  Commonly known as:  OCUFLOX     PARoxetine 40 MG tablet  Commonly known as:  PAXIL  Take 1 tablet (40 mg total) by mouth every morning.     prednisoLONE acetate 1 % ophthalmic suspension  Commonly known as:  PRED FORTE     testosterone 50 MG/5GM (1%) Gel  Commonly known as:  ANDROGEL  Place 5 g onto the skin daily. Use 1 tube daily as directed        Allergies:  Allergies  Allergen Reactions  . Viagra [Sildenafil Citrate]     Past Medical History  Diagnosis Date  . Diabetes mellitus without complication   . ED (erectile dysfunction)   . Neuropathy   . Hypertension   . Hyperlipidemia   . Depression   . Carpal tunnel syndrome   . Edema   . Lumbar disc disease     Past Surgical History  Procedure Laterality Date  . Back surgery    . Vasectomy      Family History  Problem Relation Age of Onset  . Cancer Father     Prostate  . Diabetes Neg Hx     Social History:  reports that he has never smoked. He has never used smokeless tobacco. His alcohol and drug histories are not on file.  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  Currently on AndroGel 5 g packets and his preferred preparation from insurance company  keeps changing; the last level is low normal but he does not complain of any significant fatigue  Lab Results  Component Value Date   TESTOSTERONE 375.11 05/21/2014      HYPERTENSION: Currently not well controlled with lisinopril and amlodipine. Also his creatinine tends to be upper normal  without evidence of proteinuria, he thinks he is tending to have some swelling in his legs especially in the evenings He will occasionally take Lasix for edema  Lab Results  Component Value Date   CREATININE 1.35 10/05/2014   BUN 27* 10/05/2014   NA 140 10/05/2014   K 4.2 10/05/2014   CL 109 10/05/2014   CO2 26 10/05/2014    Hypercholesterolemia: Well controlled on Lipitor, tends to have low HDL  Lab Results  Component Value Date   CHOL 135 10/05/2014   HDL 36.00* 10/05/2014   LDLCALC 78 10/05/2014   TRIG 107.0 10/05/2014   CHOLHDL 4 10/05/2014    EXAM:   BP 150/78 mmHg  Pulse 69  Temp(Src) 97.8 F (36.6 C)  Resp 16  Ht 5\' 7"  (1.702 m)  Wt 266 lb (120.657 kg)  BMI 41.65 kg/m2  SpO2 96% Trace ankle edema present   ASSESSMENT:  DIABETES: See history of present illness for detailed discussion on current blood sugar patterns and problems identified The blood sugars are overall looking better with his A1c although since he has not monitored much difficult to get a pattern of his blood sugar profile Fasting blood sugars are reasonably well controlled He is having sporadic postprandial hyperglycemia related to excessive snacks in the evenings and sometimes not covering them enough especially with higher fat content not being covered as  directed Frequently not checking blood sugars around lunchtime when bolusing Overall does appear to be benefiting from [place] and he has been using this safely, no change in renal function with this He is gaining weight and needs to change his lifestyle better   Plan:   Start checking blood sugars more consistently throughout the day especially at all meals and also around bedtime  No change in pump settings as yet  Start recumbent bike for exercise   Use temporary basal when planning to be active  He was extended boluses with extra insulin for higher fat meals and snacks; In addition he could also use a temporary basal 10-20% more overnight for controlling blood sugars with excessive snacking.  Discussed principles of dual wave boluses   He can  check his blood sugars more consistently especially when bolusing for meals  HYPERTENSION: This does not appear to be well controlled and he will change his lisinopril to lisinopril HCT  Attention deficit: Discussed that he will benefit from taking Ritalin every other day, he still does not want to see a psychiatrist for management  Hypogonadism: Needs follow-up level on the next visit  Patient Instructions  Check sugar 4x per day and more at night  Extend bolus and temp basal overnight for hi fat meals and snacks  Exercise daily  Ritalin every 2 days  Change lisinopril      [facility] 10/11/2014

## 2014-10-09 ENCOUNTER — Other Ambulatory Visit: Payer: Self-pay | Admitting: *Deleted

## 2014-10-09 MED ORDER — TESTOSTERONE 50 MG/5GM (1%) TD GEL: 5.0000 g | Freq: Every day | TRANSDERMAL | Status: DC

## 2014-10-23 ENCOUNTER — Other Ambulatory Visit: Payer: Self-pay | Admitting: *Deleted

## 2014-10-23 MED ORDER — ATORVASTATIN CALCIUM 20 MG PO TABS: 20.0000 mg | ORAL_TABLET | Freq: Every day | ORAL | Status: DC

## 2014-11-11 ENCOUNTER — Encounter: Payer: Self-pay | Admitting: Endocrinology

## 2014-11-11 ENCOUNTER — Ambulatory Visit (INDEPENDENT_AMBULATORY_CARE_PROVIDER_SITE_OTHER): Payer: Medicare Other | Admitting: Endocrinology

## 2014-11-11 VITALS — BP 128/72 | HR 72 | Temp 98.0°F | Resp 16 | Ht 67.0 in | Wt 265.0 lb

## 2014-11-11 DIAGNOSIS — Z23 Encounter for immunization: Secondary | ICD-10-CM | POA: Diagnosis not present

## 2014-11-11 DIAGNOSIS — I10 Essential (primary) hypertension: Secondary | ICD-10-CM

## 2014-11-11 DIAGNOSIS — E1065 Type 1 diabetes mellitus with hyperglycemia: Secondary | ICD-10-CM | POA: Diagnosis not present

## 2014-11-11 DIAGNOSIS — E291 Testicular hypofunction: Secondary | ICD-10-CM

## 2014-11-11 DIAGNOSIS — F988 Other specified behavioral and emotional disorders with onset usually occurring in childhood and adolescence: Secondary | ICD-10-CM

## 2014-11-11 DIAGNOSIS — IMO0002 Reserved for concepts with insufficient information to code with codable children: Secondary | ICD-10-CM

## 2014-11-11 DIAGNOSIS — F900 Attention-deficit hyperactivity disorder, predominantly inattentive type: Secondary | ICD-10-CM

## 2014-11-11 MED ORDER — METHYLPHENIDATE HCL ER (CD) 10 MG PO CPCR: 10.0000 mg | ORAL_CAPSULE | ORAL | Status: DC

## 2014-11-11 NOTE — Progress Notes (Signed)
Patient ID: Paul Adkins, male   DOB: 17-Mar-1945, 69 y.o.   MRN: [id number]   Reason for visit: Diabetes and hypertension follow-up  Diagnosis: Type 1 diabetes, date of onset 1978  PAST history: He has had persistently poorly controlled diabetes for several years. A1c has been high with usual range 8.2 -9, overall improved since 2013. Compliance with glucose monitoring and diet has been variable and he does better when he is checking more sugars. His A1c was better than usual in 3/14 at 7.5 but he was also having significant hypoglycemia overnight and also occasionally later in the day. At that time he was started on Invokana 300 mg daily which helped him with glucose control and mild weight loss  CURRENT insulin pump brand:  Medtronic   PUMP SETTINGS are: Basal rates: 2.25 from midnight- 4 AM. 4 AM = 2.0.  8:30 AM = 2.6.  12:30 p.m. = 2.75, 6 p.m. = 2.75  Boluses : 1 unit for 8 g carbs at mealtimes and blood sugar target 110-120, sensitivity 1:30 until noon and then 1:25 with active insulin 3 hours.  Changing infusion set every 3-4 days  RECENT history:  His pump settings were not  change on the last visit  With restarting his Invokana his A1c had improved to 7.9 in 8/16 which is better than usual  Current blood sugar patterns and problems:  He has his pump time of the day program 12 hours behind at least for the last 2 weeks and has been getting his morning basal rates in the evenings and vice versa.  He has however been getting low normal and sometimes low readings picking up although no nocturnal hypoglycemia.  Blood sugars are variable at suppertime, sometimes high  Does not check readings after meals as directed  Despite reminders to start exercise to help his weight loss he is still not motivated to do so.  Still has an exceedingly large intake of carbohydrates at meals and snacks especially in the evening hours with average daily carbohydrate intake of 210  He does  not think he has been eating high-fat meals, was reminded to use extended boluses for these  Frequently not checking his blood sugar at the time of boluses  Despite starting Invokana he has gained weight   Current daily basal is 60 units, recent overall average daily insulin 91 units with 66% in basal Changing infusion set every 3-4 days  GLUCOSE readings: Reviewed for the last 2 weeks, on an average checking on average less than 2 times a day  Mean values apply above for all meters except median for One Touch  PRE-MEAL Fasting Lunch Dinner Bedtime Overall  Glucose range:  54-164   69-110   82-291  ?     Mean/median:     132    Physical activity: occasionally with house cleaning or yard work  [facility] Readings from Last 3 Encounters:  11/11/14 265 lb (120.203 kg)  10/08/14 266 lb (120.657 kg)  07/07/14 261 lb 9.6 oz (118.661 kg)    Lab Results  Component Value Date   HGBA1C 7.9* 10/05/2014   HGBA1C 9.4* 05/21/2014   HGBA1C 8.6* 02/23/2014   Lab Results  Component Value Date   MICROALBUR <0.7 10/05/2014   [place] 78 10/05/2014   CREATININE 1.35 10/05/2014     No visits with results within 1 Week(s) from this visit. Latest known visit with results is:  Lab on 10/05/2014  Component Date Value Ref Range Status  .  Hgb A1c MFr Bld 10/05/2014 7.9* 4.6 - 6.5 % Final   Glycemic Control Guidelines for People with Diabetes:Non Diabetic:  <6%Goal of Therapy: <7%Additional Action Suggested:  >8%   . Sodium 10/05/2014 140  135 - 145 mEq/L Final  . Potassium 10/05/2014 4.2  3.5 - 5.1 mEq/L Final  . Chloride 10/05/2014 109  96 - 112 mEq/L Final  . CO2 10/05/2014 26  19 - 32 mEq/L Final  . Glucose, Bld 10/05/2014 132* 70 - 99 mg/dL Final  . BUN 10/05/2014 27* 6 - 23 mg/dL Final  . Creatinine, Ser 10/05/2014 1.35  0.40 - 1.50 mg/dL Final  . Total Bilirubin 10/05/2014 0.4  0.2 - 1.2 mg/dL Final  . Alkaline Phosphatase 10/05/2014 70  39 - 117 U/L Final  . AST 10/05/2014 14  0 - 37 U/L  Final  . ALT 10/05/2014 19  0 - 53 U/L Final  . Total Protein 10/05/2014 6.4  6.0 - 8.3 g/dL Final  . Albumin 10/05/2014 3.8  3.5 - 5.2 g/dL Final  . Calcium 10/05/2014 9.0  8.4 - 10.5 mg/dL Final  . GFR 10/05/2014 56.06* >60.00 mL/min Final  . Cholesterol 10/05/2014 135  0 - 200 mg/dL Final   ATP III Classification       Desirable:  < 200 mg/dL               Borderline High:  200 - 239 mg/dL          High:  > = 240 mg/dL  . Triglycerides 10/05/2014 107.0  0.0 - 149.0 mg/dL Final   Normal:  <150 mg/dLBorderline High:  150 - 199 mg/dL  . HDL 10/05/2014 36.00* >39.00 mg/dL Final  . VLDL 10/05/2014 21.4  0.0 - 40.0 mg/dL Final  . LDL Cholesterol 10/05/2014 78  0 - 99 mg/dL Final  . Total CHOL/HDL Ratio 10/05/2014 4   Final                  Men          Women1/2 Average Risk     3.4          3.3Average Risk          5.0          4.42X Average Risk          9.6          7.13X Average Risk          15.0          11.0                      . NonHDL 10/05/2014 99.39   Final   NOTE:  Non-HDL goal should be 30 mg/dL higher than patient's LDL goal (i.e. LDL goal of < 70 mg/dL, would have non-HDL goal of < 100 mg/dL)  . Microalb, Ur 10/05/2014 <0.7  0.0 - 1.9 mg/dL Final  . Creatinine,U 10/05/2014 82.1   Final  . Microalb Creat Ratio 10/05/2014 0.9  0.0 - 30.0 mg/g Final      Medication List       This list is accurate as of: 11/11/14  9:29 PM.  Always use your most recent med list.               amLODipine 5 MG tablet  Commonly known as:  NORVASC  Take 1 tablet (5 mg total) by mouth daily. Take 1 tablet daily     aspirin 81  MG tablet  Take 81 mg by mouth daily.     atorvastatin 20 MG tablet  Commonly known as:  LIPITOR  Take 1 tablet (20 mg total) by mouth daily.     BOOSTRIX 5-2.5-18.5 LF-MCG/0.5 injection  Generic drug:  Tdap     canagliflozin 300 MG Tabs tablet  Commonly known as:  INVOKANA  Take 300 mg by mouth daily.     glucose blood test strip  Commonly known as:  BAYER  CONTOUR NEXT TEST  Use as instructed to check blood sugars 4 times per day dx code E10.65     ketorolac 0.5 % ophthalmic solution  Commonly known as:  ACULAR     lisinopril-hydrochlorothiazide 20-12.5 MG per tablet  Commonly known as:  ZESTORETIC  Take 1 tablet by mouth daily.     methylphenidate 10 MG CR capsule  Commonly known as:  METADATE CD  Take 1 capsule (10 mg total) by mouth every morning.     NOVOLOG 100 UNIT/ML injection  Generic drug:  insulin aspart  INJECT 110 UNITS VIA PUMP EVERY DAY     ofloxacin 0.3 % ophthalmic solution  Commonly known as:  OCUFLOX     PARoxetine 40 MG tablet  Commonly known as:  PAXIL  Take 1 tablet (40 mg total) by mouth every morning.     prednisoLONE acetate 1 % ophthalmic suspension  Commonly known as:  PRED FORTE     testosterone 50 MG/5GM (1%) Gel  Commonly known as:  ANDROGEL  Place 5 g onto the skin daily. Use 1 tube daily as directed        Allergies:  Allergies  Allergen Reactions  . Viagra [Sildenafil Citrate]     Past Medical History  Diagnosis Date  . Diabetes mellitus without complication   . ED (erectile dysfunction)   . Neuropathy   . Hypertension   . Hyperlipidemia   . Depression   . Carpal tunnel syndrome   . Edema   . Lumbar disc disease     Past Surgical History  Procedure Laterality Date  . Back surgery    . Vasectomy      Family History  Problem Relation Age of Onset  . Cancer Father     Prostate  . Diabetes Neg Hx     Social History:  reports that he has never smoked. He has never used smokeless tobacco. His alcohol and drug histories are not on file.  REVIEW of systems:   Eye exams have been regular, previously has had retinopathy  ADD: he has benefited from Ritalin extended release and he does think it helps him concentrate and motivate him better However he takes this only as needed, he thinks it helps him when he is needing to do more activities or more things at work.  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  Currently on AndroGel 5 g packets and is compliant with this, the last level is low normal but he does not complain of any significant fatigue  Lab Results  Component Value Date   TESTOSTERONE 375.11 05/21/2014     HYPERTENSION: Currently taking lisinopril HCT instead of lisinopril with his amlodipine He was having trace edema on his  legs which is better also  Also his creatinine tends to be upper normal without evidence of proteinuria   Lab Results  Component Value Date   CREATININE 1.35 10/05/2014   BUN 27* 10/05/2014   NA 140 10/05/2014   K 4.2 10/05/2014   CL 109 10/05/2014   CO2 26 10/05/2014    Hypercholesterolemia: Well controlled on Lipitor, tends to have low HDL  Lab Results  Component Value Date   CHOL 135 10/05/2014   HDL 36.00* 10/05/2014   LDLCALC 78 10/05/2014   TRIG 107.0 10/05/2014   CHOLHDL 4 10/05/2014    EXAM:   BP 128/72 mmHg  Pulse 72  Temp(Src) 98 F (36.7 C)  Resp 16  Ht 5\' 7"  (1.702 m)  Wt 265 lb (120.203 kg)  BMI 41.50 kg/m2  SpO2 96%    ASSESSMENT:  DIABETES: See history of present illness for detailed discussion on current blood sugar patterns and problems identified He is still not checking blood sugars as directed often enough With his morning and evening basal rates reversed because of incorrect time programmed on his pump he is getting low normal readings at breakfast and sometimes high readings at suppertime Usually has difficulty controlling postprandial readings because of frequent snacks and carbohydrate intake Also is not exercising and has difficulty losing weight despite using Invokana Again  discussed continuous glucose monitoring but this is not covered by Medicare He has not been motivated to exercise as discussed before, discussed options of using an exercise bike or joining the gym which is convenient for him otherwise He needs to check his blood sugars more often especially after supper  HYPERTENSION: Much better controlled now.  Will need to recheck renal function and potassium with change of lisinopril to Zestoretic  ADD: He can try taking 10 mg Ritalin instead of 20 and take it at least every other day.  Explained to him that this is extended release preparation  HYPOGONADISM: He will need follow-up level of testosterone   Patient Instructions  Check sugar with each meal or large snacks  Exercise   Counseling time on subjects discussed above is over 50% of today's 25 minute visit     [facility] 11/11/2014

## 2014-11-11 NOTE — Patient Instructions (Signed)
Check sugar with each meal or large snacks  Exercise

## 2014-11-23 ENCOUNTER — Other Ambulatory Visit: Payer: Self-pay | Admitting: Endocrinology

## 2014-12-03 ENCOUNTER — Ambulatory Visit (INDEPENDENT_AMBULATORY_CARE_PROVIDER_SITE_OTHER): Payer: Medicare Other | Admitting: Ophthalmology

## 2014-12-03 DIAGNOSIS — E103291 Type 1 diabetes mellitus with mild nonproliferative diabetic retinopathy without macular edema, right eye: Secondary | ICD-10-CM | POA: Diagnosis not present

## 2014-12-03 DIAGNOSIS — H43813 Vitreous degeneration, bilateral: Secondary | ICD-10-CM | POA: Diagnosis not present

## 2014-12-03 DIAGNOSIS — D3132 Benign neoplasm of left choroid: Secondary | ICD-10-CM

## 2014-12-03 DIAGNOSIS — E11319 Type 2 diabetes mellitus with unspecified diabetic retinopathy without macular edema: Secondary | ICD-10-CM | POA: Diagnosis not present

## 2014-12-03 DIAGNOSIS — I10 Essential (primary) hypertension: Secondary | ICD-10-CM

## 2014-12-03 DIAGNOSIS — H35033 Hypertensive retinopathy, bilateral: Secondary | ICD-10-CM | POA: Diagnosis not present

## 2014-12-03 DIAGNOSIS — E103592 Type 1 diabetes mellitus with proliferative diabetic retinopathy without macular edema, left eye: Secondary | ICD-10-CM

## 2014-12-08 ENCOUNTER — Other Ambulatory Visit: Payer: Self-pay | Admitting: Endocrinology

## 2014-12-24 ENCOUNTER — Other Ambulatory Visit: Payer: Self-pay | Admitting: *Deleted

## 2014-12-24 MED ORDER — PAROXETINE HCL 40 MG PO TABS
40.0000 mg | ORAL_TABLET | ORAL | Status: DC
Start: 2014-12-24 — End: 2015-05-25

## 2015-01-26 ENCOUNTER — Telehealth: Payer: Self-pay | Admitting: Endocrinology

## 2015-01-26 NOTE — Telephone Encounter (Signed)
Pt needs PA on his testosterone, please advise on status

## 2015-01-26 NOTE — Telephone Encounter (Signed)
The P.A was sent in yesterday, waiting for approval

## 2015-01-29 ENCOUNTER — Other Ambulatory Visit (INDEPENDENT_AMBULATORY_CARE_PROVIDER_SITE_OTHER): Payer: Medicare Other

## 2015-01-29 DIAGNOSIS — E1065 Type 1 diabetes mellitus with hyperglycemia: Secondary | ICD-10-CM

## 2015-01-29 DIAGNOSIS — E291 Testicular hypofunction: Secondary | ICD-10-CM

## 2015-01-29 DIAGNOSIS — IMO0002 Reserved for concepts with insufficient information to code with codable children: Secondary | ICD-10-CM

## 2015-01-29 LAB — BASIC METABOLIC PANEL
BUN: 30 mg/dL — AB (ref 6–23)
CALCIUM: 9 mg/dL (ref 8.4–10.5)
CO2: 27 mEq/L (ref 19–32)
Chloride: 107 mEq/L (ref 96–112)
Creatinine, Ser: 1.49 mg/dL (ref 0.40–1.50)
GFR: 49.98 mL/min — AB (ref >60.00)
Glucose, Bld: 120 mg/dL — ABNORMAL HIGH (ref 70–99)
POTASSIUM: 4 meq/L (ref 3.5–5.1)
SODIUM: 142 meq/L (ref 135–145)

## 2015-01-29 LAB — TESTOSTERONE: TESTOSTERONE: 468.91 ng/dL (ref 300.00–890.00)

## 2015-02-03 ENCOUNTER — Ambulatory Visit (INDEPENDENT_AMBULATORY_CARE_PROVIDER_SITE_OTHER): Payer: Medicare Other | Admitting: Endocrinology

## 2015-02-03 ENCOUNTER — Encounter: Payer: Self-pay | Admitting: Endocrinology

## 2015-02-03 VITALS — BP 128/60 | HR 71 | Temp 98.2°F | Resp 14 | Ht 67.0 in | Wt 263.0 lb

## 2015-02-03 DIAGNOSIS — E230 Hypopituitarism: Secondary | ICD-10-CM

## 2015-02-03 DIAGNOSIS — I10 Essential (primary) hypertension: Secondary | ICD-10-CM

## 2015-02-03 DIAGNOSIS — E109 Type 1 diabetes mellitus without complications: Secondary | ICD-10-CM | POA: Diagnosis not present

## 2015-02-03 DIAGNOSIS — IMO0001 Reserved for inherently not codable concepts without codable children: Secondary | ICD-10-CM

## 2015-02-03 DIAGNOSIS — E1065 Type 1 diabetes mellitus with hyperglycemia: Principal | ICD-10-CM

## 2015-02-03 LAB — POCT GLYCOSYLATED HEMOGLOBIN (HGB A1C): HEMOGLOBIN A1C: 7.9

## 2015-02-03 MED ORDER — METHYLPHENIDATE HCL ER (CD) 10 MG PO CPCR: 10.0000 mg | ORAL_CAPSULE | ORAL | Status: DC

## 2015-02-03 NOTE — Patient Instructions (Addendum)
Check sugars 4x daily  Reduce Invokana to 1/2 tab daily  Exercise, walk 5/7 days  Use temp basal if planning to be very active rather to suspend

## 2015-02-03 NOTE — Progress Notes (Signed)
Patient ID: Paul Adkins, male   DOB: 03/04/1945, 69 y.o.   MRN: [id number]   Reason for visit: Diabetes and hypertension follow-up  Diagnosis: Type 1 diabetes, date of onset 1978  PAST history: He has had persistently poorly controlled diabetes for several years. A1c has been high with usual range 8.2 -9, overall improved since 2013. Compliance with glucose monitoring and diet has been variable and he does better when he is checking more sugars. His A1c was better than usual in 3/14 at 7.5 but he was also having significant hypoglycemia overnight and also occasionally later in the day. At that time he was started on Invokana 300 mg daily which helped him with glucose control and mild weight loss  CURRENT insulin pump brand:  Medtronic   PUMP SETTINGS are:  Basal rates: 2.25 from midnight- 4 AM. 4 AM = 2.0.  8:30 AM = 2.6.  Boluses : 1 unit for 8 g carbs at mealtimes and blood sugar target 110-120, sensitivity 1:30 until noon and then 1:25 with active insulin 3 hours.  Changing infusion set every 3-4 days  RECENT history:  His pump settings were not  change on the last visit since he had his time program and incorrectly  With restarting his Invokana his A1c had improved to 7.9 in 8/16 and this is now consistently improved also  Current blood sugar patterns and problems:  He has not checked his blood sugars much as usual and mostly in the morning  FASTING blood sugars are usually fairly good with a couple of high readings  He has had some overnight HYPOGLYCEMIA which he thinks was when he was doing a lot of physical activity with moving to a new home   His postprandial readings are difficult to assess, they may be relatively higher after supper but not consistently; also does not use extended boluses for higher fat meals.  More recently maybe occasionally going off his diet with excessive eating in the evening  He can usually detect symptoms of hypoglycemia although during the  night the only symptom he has his feeling more alert  He has occasionally suspended his pump for increased activity but then forgot to turn it back on, does not use temporary basal settings  Currently not motivated to exercise but may be able to start walking   Current daily basal is 60 units, recent overall average daily insulin 91 units with 66% in basal Changing infusion set every 3-4 days  GLUCOSE readings: Reviewed for the last 2 weeks, on an average checking on average less than 2 times a day  Mean values apply above for all meters except median for One Touch  PRE-MEAL Fasting Lunch Dinner Bedtime Overall  Glucose range:  94-171   102-143    127-269    Mean/median:  125     209   149+/-59    POST-MEAL PC Breakfast PC Lunch PC Dinner  Glucose range:   68, 270   170, 269   Mean/median:       Physical activity: Recently with moving, lifting and opening boxes   Wt Readings from Last 3 Encounters:  02/03/15 263 lb (119.296 kg)  11/11/14 265 lb (120.203 kg)  10/08/14 266 lb (120.657 kg)    Lab Results  Component Value Date   HGBA1C 7.9* 10/05/2014   HGBA1C 9.4* 05/21/2014   HGBA1C 8.6* 02/23/2014   Lab Results  Component Value Date   MICROALBUR <0.7 10/05/2014   [place] 78 10/05/2014  CREATININE 1.49 01/29/2015     Lab on 01/29/2015  Component Date Value Ref Range Status  . Sodium 01/29/2015 142  135 - 145 mEq/L Final  . Potassium 01/29/2015 4.0  3.5 - 5.1 mEq/L Final  . Chloride 01/29/2015 107  96 - 112 mEq/L Final  . CO2 01/29/2015 27  19 - 32 mEq/L Final  . Glucose, Bld 01/29/2015 120* 70 - 99 mg/dL Final  . BUN 01/29/2015 30* 6 - 23 mg/dL Final  . Creatinine, Ser 01/29/2015 1.49  0.40 - 1.50 mg/dL Final  . Calcium 01/29/2015 9.0  8.4 - 10.5 mg/dL Final  . GFR 01/29/2015 49.98* >60.00 mL/min Final  . Testosterone 01/29/2015 468.91  300.00 - 890.00 ng/dL Final      Medication List       This list is accurate as of: 02/03/15  8:07 AM.  Always use your  most recent med list.               amLODipine 5 MG tablet  Commonly known as:  NORVASC  TAKE 1 TABLET (5 MG TOTAL) BY MOUTH DAILY.     aspirin 81 MG tablet  Take 81 mg by mouth daily.     atorvastatin 20 MG tablet  Commonly known as:  LIPITOR  Take 1 tablet (20 mg total) by mouth daily.     BOOSTRIX 5-2.5-18.5 LF-MCG/0.5 injection  Generic drug:  Tdap     glucose blood test strip  Commonly known as:  BAYER CONTOUR NEXT TEST  Use as instructed to check blood sugars 4 times per day dx code E10.65     INVOKANA 300 MG Tabs tablet  Generic drug:  canagliflozin  TAKE 1 TABLET BY MOUTH EVERY DAY     ketorolac 0.5 % ophthalmic solution  Commonly known as:  ACULAR     lisinopril-hydrochlorothiazide 20-12.5 MG tablet  Commonly known as:  ZESTORETIC  Take 1 tablet by mouth daily.     methylphenidate 10 MG CR capsule  Commonly known as:  METADATE CD  Take 1 capsule (10 mg total) by mouth every morning.     NOVOLOG 100 UNIT/ML injection  Generic drug:  insulin aspart  INJECT 110 UNITS VIA PUMP EVERY DAY     ofloxacin 0.3 % ophthalmic solution  Commonly known as:  OCUFLOX     PARoxetine 40 MG tablet  Commonly known as:  PAXIL  Take 1 tablet (40 mg total) by mouth every morning.     prednisoLONE acetate 1 % ophthalmic suspension  Commonly known as:  PRED FORTE     testosterone 50 MG/5GM (1%) Gel  Commonly known as:  ANDROGEL  Place 5 g onto the skin daily. Use 1 tube daily as directed        Allergies:  Allergies  Allergen Reactions  . Viagra [Sildenafil Citrate]     Past Medical History  Diagnosis Date  . Diabetes mellitus without complication ([place])   . ED (erectile dysfunction)   . Neuropathy ([place])   . Hypertension   . Hyperlipidemia   . Depression   . Carpal tunnel syndrome   . Edema   . Lumbar disc disease     Past Surgical History  Procedure Laterality Date  . Back surgery    . Vasectomy      Family History  Problem Relation Age of Onset    . Cancer Father     Prostate  . Diabetes Neg Hx     Social History:  reports that he  has never smoked. He has never used smokeless tobacco. His alcohol and drug histories are not on file.  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  Currently on AndroGel 5 g packets and is compliant with this, the last level is normal    Lab Results  Component Value Date   TESTOSTERONE 468.91 01/29/2015     HYPERTENSION: Currently taking lisinopril HCT instead of lisinopril with his amlodipine No recent edema  Also his creatinine tends to be upper normal without evidence of proteinuria, slightly higher now and he complains of increased thirst   Lab Results  Component Value Date   CREATININE 1.49 01/29/2015   BUN 30* 01/29/2015   NA 142 01/29/2015   K 4.0 01/29/2015   CL 107 01/29/2015   CO2 27 01/29/2015    Hypercholesterolemia: Well controlled on Lipitor, tends to have low HDL  Lab Results  Component Value Date   CHOL 135 10/05/2014   HDL 36.00* 10/05/2014   LDLCALC 78 10/05/2014   TRIG 107.0 10/05/2014   CHOLHDL 4 10/05/2014    EXAM:   BP 128/60 mmHg  Pulse 71  Temp(Src) 98.2 F (36.8 C)  Resp 14  Ht 5\' 7"  (1.702 m)  Wt 263 lb (119.296 kg)  BMI 41.18 kg/m2  SpO2 96%  Standing blood pressure 138/60  ASSESSMENT:  DIABETES: See history of present illness for detailed discussion on current blood sugar patterns and problems identified His A1c is fairly good at  7.9, appears to be benefiting from [place] He is still not checking blood sugars as directed often enough especially during the day and after meals and mostly checking in the mornings Although he may be getting better blood sugars at times with being more active in the last 2 weeks he still has getting high post related readings at night which she is not checking consistently He is a good candidate for doing a continuous glucose monitoring recording but he wants to wait till next visit since he is going to go out of town next week  For now will continue his basal rates unchanged Discussed adjusting basal rate for increased activity with using temporary basal rate rather than suspending Increase coverage for higher fat meals and take extended boluses Reduce Invokana to half tablet as he may be volume depleted  HYPERTENSION: Continue lisinopril ACT and amlodipine  Mild renal dysfunction: Probably prerenal as discussed above and may improve with reducing Invokana  ADD: New prescription for 10 mg Ritalin given  HYPOGONADISM: He will stay on the same dose as the level is therapeutic   There are no Patient Instructions on file for this visit.  Counseling time on subjects discussed above is over 50% of today's 25 minute visit     [facility] 02/03/2015

## 2015-02-24 ENCOUNTER — Encounter: Payer: Medicare Other | Admitting: Endocrinology

## 2015-02-24 ENCOUNTER — Ambulatory Visit: Payer: Medicare Other | Admitting: Nutrition

## 2015-02-24 DIAGNOSIS — E1065 Type 1 diabetes mellitus with hyperglycemia: Secondary | ICD-10-CM

## 2015-03-10 ENCOUNTER — Encounter: Payer: Medicare Other | Attending: Endocrinology | Admitting: Nutrition

## 2015-03-10 DIAGNOSIS — Z713 Dietary counseling and surveillance: Secondary | ICD-10-CM | POA: Diagnosis not present

## 2015-03-10 DIAGNOSIS — E108 Type 1 diabetes mellitus with unspecified complications: Secondary | ICD-10-CM | POA: Insufficient documentation

## 2015-03-10 NOTE — Progress Notes (Unsigned)
CGM start on Him on 02/24/15.  A sensor was inserted into his left upper, outer arm.  He was instructed on how to fill out the diary, and had no final questions.  He will return in 2 weeks to have the sensor removed.

## 2015-03-10 NOTE — Progress Notes (Signed)
Paul Adkins reports having a cold that required bedrest on 12/28-03/01/15.  Said no fever, but stuffy nose with drainage and cough that was productive.  Reports cough is non productive, now and drainage is much improved.  Patient reports that every day, he will put in 38 grams of carbs for breakfast and eat only 15-18.  He is also increasing the bolus by 1.5 to 2u with all meals.  He is also adding 10-20 more carbs and 2 more units to the bolus wizard with all meals and snacks. He reports snacking a lot after supper, but is giving insulin for all snacks.  He would like to loose 20 pounds in the next 2 months.  Suggestions given for low calorie snacks after supper that are between 100-150 calories.   Says he had 3 "bad sites" that caused his blood sugars to go over 400.  I noted those on the pump download.   He admits to also adding more insulin when doing a correction bolus as well, but "will not add as much to the correction bolus if it is bedtime"  He is getting a stationary bike delivered tomorrow and will be doing this before breakfast 5 days/wk.  Discussed the idea of starting slowly for 10-15 min., and working up to 30-40 min.  Also suggested he can split the time to 20 min. Twice a day.  We discussed the need to reduce the pre meal bolus by 10-20% and he reported good understanding of this.

## 2015-03-25 ENCOUNTER — Other Ambulatory Visit: Payer: Self-pay | Admitting: *Deleted

## 2015-03-25 ENCOUNTER — Telehealth: Payer: Self-pay | Admitting: Endocrinology

## 2015-03-25 MED ORDER — INSULIN LISPRO 100 UNIT/ML ~~LOC~~ SOLN: SUBCUTANEOUS | Status: DC

## 2015-03-25 NOTE — Telephone Encounter (Signed)
Pt needs the insulin rx but the new rx needs to be for humalog called to [facility] on [address]

## 2015-03-25 NOTE — Telephone Encounter (Signed)
rx sent for Humalog 

## 2015-04-02 ENCOUNTER — Other Ambulatory Visit: Payer: Self-pay | Admitting: *Deleted

## 2015-04-02 ENCOUNTER — Telehealth: Payer: Self-pay | Admitting: Endocrinology

## 2015-04-02 MED ORDER — INSULIN LISPRO 100 UNIT/ML ~~LOC~~ SOLN: SUBCUTANEOUS | Status: DC

## 2015-04-02 NOTE — Telephone Encounter (Signed)
Please send hard copy F# [phone] for the humlog to his pharmacy

## 2015-04-02 NOTE — Telephone Encounter (Signed)
rx manually faxed

## 2015-04-19 ENCOUNTER — Other Ambulatory Visit: Payer: Self-pay | Admitting: *Deleted

## 2015-04-19 MED ORDER — TESTOSTERONE 50 MG/5GM (1%) TD GEL: 5.0000 g | Freq: Every day | TRANSDERMAL | Status: DC

## 2015-04-29 ENCOUNTER — Other Ambulatory Visit (INDEPENDENT_AMBULATORY_CARE_PROVIDER_SITE_OTHER): Payer: Medicare Other

## 2015-04-29 DIAGNOSIS — E1065 Type 1 diabetes mellitus with hyperglycemia: Principal | ICD-10-CM

## 2015-04-29 DIAGNOSIS — E109 Type 1 diabetes mellitus without complications: Secondary | ICD-10-CM

## 2015-04-29 DIAGNOSIS — IMO0001 Reserved for inherently not codable concepts without codable children: Secondary | ICD-10-CM

## 2015-04-29 LAB — COMPREHENSIVE METABOLIC PANEL
ALT: 18 U/L (ref 0–53)
AST: 14 U/L (ref 0–37)
Albumin: 3.8 g/dL (ref 3.5–5.2)
Alkaline Phosphatase: 67 U/L (ref 39–117)
BUN: 30 mg/dL — ABNORMAL HIGH (ref 6–23)
CALCIUM: 8.7 mg/dL (ref 8.4–10.5)
CHLORIDE: 107 meq/L (ref 96–112)
CO2: 24 meq/L (ref 19–32)
Creatinine, Ser: 1.48 mg/dL (ref 0.40–1.50)
GFR: 50.33 mL/min — AB (ref >60.00)
Glucose, Bld: 181 mg/dL — ABNORMAL HIGH (ref 70–99)
Potassium: 4 mEq/L (ref 3.5–5.1)
Sodium: 137 mEq/L (ref 135–145)
TOTAL PROTEIN: 6.3 g/dL (ref 6.0–8.3)
Total Bilirubin: 0.3 mg/dL (ref 0.2–1.2)

## 2015-04-29 LAB — HEMOGLOBIN A1C: HEMOGLOBIN A1C: 8.2 % — AB (ref 4.6–6.5)

## 2015-05-04 ENCOUNTER — Encounter: Payer: Self-pay | Admitting: Endocrinology

## 2015-05-04 ENCOUNTER — Ambulatory Visit (INDEPENDENT_AMBULATORY_CARE_PROVIDER_SITE_OTHER): Payer: Medicare Other | Admitting: Endocrinology

## 2015-05-04 VITALS — BP 128/64 | HR 73 | Temp 98.0°F | Resp 16 | Ht 67.0 in | Wt 263.8 lb

## 2015-05-04 DIAGNOSIS — E1065 Type 1 diabetes mellitus with hyperglycemia: Secondary | ICD-10-CM | POA: Diagnosis not present

## 2015-05-04 NOTE — Progress Notes (Signed)
Patient ID: Paul Adkins, male   DOB: Jan 13, 1946, 70 y.o.   MRN: [id number]   Reason for visit: Diabetes and hypertension follow-up  Diagnosis: Type 1 diabetes, date of onset 1978  PAST history: He has had persistently poorly controlled diabetes for several years. A1c has been high with usual range 8.2 -9, overall improved since 2013. Compliance with glucose monitoring and diet has been variable and he does better when he is checking more sugars. His A1c was better than usual in 3/14 at 7.5 but he was also having significant hypoglycemia overnight and also occasionally later in the day. At that time he was started on Invokana 300 mg daily which helped him with glucose control and mild weight loss  CURRENT insulin pump brand:  Medtronic   PUMP SETTINGS are:  Basal rates: 2.25 from midnight- 4 AM. 4 AM = 2.0.  8:30 AM = 2.6.  Boluses : 1 unit for 8 g carbs at mealtimes and blood sugar target 110-120, sensitivity 1:30 until noon and then 1:25 with active insulin 3 hours.  Changing infusion set every 3-4 days  RECENT history:    With restarting his Invokana his A1c had improved to 7.9 in 8/16 and this is now consistently improved also His A1c is now 8.2.  He is here for follow-up of his continuous glucose monitoring that he had done before and review of management  INTERPRETATION of continuous glucose monitoring:  Dates of Recording: 02/24/15 through 12/29/15  Sensor  summary:  Average blood glucose for the period of recording is 199 Has adequate analysis of blood sugars on each of the days.  His blood sugars were showing the best pattern on 03/09/15 with 59% of the time being in a good range.  His blood sugars are fluctuating the most during the night and the least around lunchtime     Glycemic patterns:  Hyperglycemic episodes are occurring mostly overnight and late in the evenings.  Generally blood sugars are above target at least 60% of the time on most days and frequently over  75% of the time  Hypoglycemic episodes: None      Overnight periods:  Blood sugars are extremely variable on day-to-day comparison with blood sugar pattern ranging from low normal to persistently high readings of 350.  Highest reading was on the night of 1/1 because of site problem persisting into the next morning.  Blood sugars generally are constant through the night.  Some tendency to dropping relatively lower if starting off over 250     Preprandial periods:  Average reading around breakfast time is about 180 with some fluctuation.  Blood sugars midday are around 180 and before supper time slightly lower at 170-180 mild fluctuation     Postprandial periods:  After breakfast blood sugars are overall rising modestly and also after lunch and supper but having more variability in the afternoon and evening.  Blood sugars tend to rise on some days late into the night despite bolusing several times in the evening hours     Hypoglycemia: None      Recommendations Evaluate factors causing variability in blood sugars overnight and late evening including dietary factors and higher fat intake as well as more consistent monitoring of blood sugars by fingerstick in the evening     Current blood sugar patterns and problems:  He has not checked his blood sugars much and mostly in the morning  He now says that he is trying to add extra 1 or 2 units  before each meal based on his carbohydrate coverage since he does not think the ratio is adequate.  If he is eating 30 g of carbohydrate he will take 5 units  Recently fasting blood sugars are mostly high  Has some high readings late in the evening but is not checking blood sugars with his meals and bolusing without checking his sugar is also at lunchtime.  He thinks his blood sugars do better when he is exercising but has not been able to do this now   Current daily basal is 60 units, recent overall average daily insulin 91 units with 66% in  basal Changing infusion set every 3-4 days  GLUCOSE readings: Reviewed for the last 2 weeks, on an average checking on average less than 2 times a day  Mean values apply above for all meters except median for One Touch  PRE-MEAL Fasting Lunch Dinner Bedtime Overall  Glucose range: 147-319   102-341   Mean/median: 196    193   Physical activity: Recently none   Wt Readings from Last 3 Encounters:  05/04/15 263 lb 12.8 oz (119.659 kg)  02/03/15 263 lb (119.296 kg)  11/11/14 265 lb (120.203 kg)    Lab Results  Component Value Date   HGBA1C 8.2* 04/29/2015   HGBA1C 7.9 02/03/2015   HGBA1C 7.9* 10/05/2014   Lab Results  Component Value Date   MICROALBUR <0.7 10/05/2014   LDLCALC 78 10/05/2014   CREATININE 1.48 04/29/2015     Lab on 04/29/2015  Component Date Value Ref Range Status  . Hgb A1c MFr Bld 04/29/2015 8.2* 4.6 - 6.5 % Final   Glycemic Control Guidelines for People with Diabetes:Non Diabetic:  <6%Goal of Therapy: <7%Additional Action Suggested:  >8%   . Sodium 04/29/2015 137  135 - 145 mEq/L Final  . Potassium 04/29/2015 4.0  3.5 - 5.1 mEq/L Final  . Chloride 04/29/2015 107  96 - 112 mEq/L Final  . CO2 04/29/2015 24  19 - 32 mEq/L Final  . Glucose, Bld 04/29/2015 181* 70 - 99 mg/dL Final  . BUN 04/29/2015 30* 6 - 23 mg/dL Final  . Creatinine, Ser 04/29/2015 1.48  0.40 - 1.50 mg/dL Final  . Total Bilirubin 04/29/2015 0.3  0.2 - 1.2 mg/dL Final  . Alkaline Phosphatase 04/29/2015 67  39 - 117 U/L Final  . AST 04/29/2015 14  0 - 37 U/L Final  . ALT 04/29/2015 18  0 - 53 U/L Final  . Total Protein 04/29/2015 6.3  6.0 - 8.3 g/dL Final  . Albumin 04/29/2015 3.8  3.5 - 5.2 g/dL Final  . Calcium 04/29/2015 8.7  8.4 - 10.5 mg/dL Final  . GFR 04/29/2015 50.33* >60.00 mL/min Final      Medication List       This list is accurate as of: 05/04/15  3:00 PM.  Always use your most recent med list.               amLODipine 5 MG tablet  Commonly known as:  NORVASC   TAKE 1 TABLET (5 MG TOTAL) BY MOUTH DAILY.     aspirin 81 MG tablet  Take 81 mg by mouth daily.     atorvastatin 20 MG tablet  Commonly known as:  LIPITOR  Take 1 tablet (20 mg total) by mouth daily.     BOOSTRIX 5-2.5-18.5 LF-MCG/0.5 injection  Generic drug:  Tdap     glucose blood test strip  Commonly known as:  BAYER CONTOUR NEXT TEST  Use  as instructed to check blood sugars 4 times per day dx code E10.65     insulin lispro 100 UNIT/ML injection  Commonly known as:  HUMALOG  Use max 110 units per day with continuous insulin pump DX code E10.65     INVOKANA 300 MG Tabs tablet  Generic drug:  canagliflozin  TAKE 1 TABLET BY MOUTH EVERY DAY     ketorolac 0.5 % ophthalmic solution  Commonly known as:  ACULAR     lisinopril-hydrochlorothiazide 20-12.5 MG tablet  Commonly known as:  ZESTORETIC  Take 1 tablet by mouth daily.     methylphenidate 10 MG CR capsule  Commonly known as:  METADATE CD  Take 1 capsule (10 mg total) by mouth every morning.     ofloxacin 0.3 % ophthalmic solution  Commonly known as:  OCUFLOX     PARoxetine 40 MG tablet  Commonly known as:  PAXIL  Take 1 tablet (40 mg total) by mouth every morning.     prednisoLONE acetate 1 % ophthalmic suspension  Commonly known as:  PRED FORTE     testosterone 50 MG/5GM (1%) Gel  Commonly known as:  ANDROGEL  Place 5 g onto the skin daily. Use 1 tube daily as directed        Allergies:  Allergies  Allergen Reactions  . Viagra [Sildenafil Citrate]     Past Medical History  Diagnosis Date  . Diabetes mellitus without complication ([place])   . ED (erectile dysfunction)   . Neuropathy ([place])   . Hypertension   . Hyperlipidemia   . Depression   . Carpal tunnel syndrome   . Edema   . Lumbar disc disease     Past Surgical History  Procedure Laterality Date  . Back surgery    . Vasectomy      Family History  Problem Relation Age of Onset  . Cancer Father     Prostate  . Diabetes Neg Hx      Social History:  reports that he has never smoked. He has never used smokeless tobacco. His alcohol and drug histories are not on file.  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  Currently on AndroGel 5 g packets and is compliant with this, the last level is normal    Lab Results  Component Value Date   TESTOSTERONE 468.91 01/29/2015     HYPERTENSION: Currently taking lisinopril HCT with his amlodipine No recent edema  Also his creatinine tends to be upper normal without evidence of proteinuria This appears to be same with reducing Invokana to half tablet   Lab Results  Component Value Date   CREATININE 1.48 04/29/2015   BUN 30* 04/29/2015   NA 137 04/29/2015   K 4.0 04/29/2015   CL 107 04/29/2015   CO2 24 04/29/2015    Hypercholesterolemia: Well controlled on Lipitor, tends to have low HDL  Lab Results  Component Value Date   CHOL 135 10/05/2014   HDL 36.00* 10/05/2014   LDLCALC 78 10/05/2014   TRIG 107.0 10/05/2014   CHOLHDL 4 10/05/2014  EXAM:   BP 128/64 mmHg  Pulse 73  Temp(Src) 98 F (36.7 C)  Resp 16  Ht 5\' 7"  (1.702 m)  Wt 263 lb 12.8 oz (119.659 kg)  BMI 41.31 kg/m2  SpO2 93%    ASSESSMENT:  DIABETES: See history of present illness for detailed discussion on current blood sugar patterns and problems identified His A1c is Still relatively high at 8.2 He has marked variability in his blood sugars seen on the continuous glucose monitoring is high readings and then overnight.  However  there is no consistent pattern and some nights his blood sugars may be near normal also. He thinks that he is needing significant amount of boluses for his meals and likely postprandial glycemia is based on how much he is eating and what type of meals he is having. Blood sugars before lunch and dinner are variable and not consistently high and therefore basal rate changes cannot be recommended.  However more recently his fingersticks indicated fairly consistently high readings at breakfast time and will increase his morning basal rate up to 2.2. Encouraged him to check his blood sugars consistently at suppertime since this will help control his postprandial hyperglycemia and avoid high fat meals and snacks. He was also tried to start exercising   HYPERTENSION: Controlled Continue lisinopril ACT and amlodipine  Mild renal dysfunction: No change.  Consider reducing lisinopril   There are no Patient Instructions on file for this visit.  Counseling time on subjects discussed above is over 50% of today's 25 minute visit     [facility] 05/04/2015

## 2015-05-25 ENCOUNTER — Other Ambulatory Visit: Payer: Self-pay | Admitting: Endocrinology

## 2015-05-25 MED ORDER — PAROXETINE HCL 40 MG PO TABS: 40.0000 mg | ORAL_TABLET | ORAL | Status: DC

## 2015-05-29 ENCOUNTER — Other Ambulatory Visit: Payer: Self-pay | Admitting: Endocrinology

## 2015-06-03 ENCOUNTER — Encounter: Payer: Self-pay | Admitting: *Deleted

## 2015-06-03 ENCOUNTER — Ambulatory Visit (INDEPENDENT_AMBULATORY_CARE_PROVIDER_SITE_OTHER): Payer: Medicare Other | Admitting: Ophthalmology

## 2015-06-03 DIAGNOSIS — I10 Essential (primary) hypertension: Secondary | ICD-10-CM

## 2015-06-03 DIAGNOSIS — D3132 Benign neoplasm of left choroid: Secondary | ICD-10-CM | POA: Diagnosis not present

## 2015-06-03 DIAGNOSIS — E103592 Type 1 diabetes mellitus with proliferative diabetic retinopathy without macular edema, left eye: Secondary | ICD-10-CM

## 2015-06-03 DIAGNOSIS — H35033 Hypertensive retinopathy, bilateral: Secondary | ICD-10-CM

## 2015-06-03 DIAGNOSIS — E10319 Type 1 diabetes mellitus with unspecified diabetic retinopathy without macular edema: Secondary | ICD-10-CM | POA: Diagnosis not present

## 2015-06-03 DIAGNOSIS — H43813 Vitreous degeneration, bilateral: Secondary | ICD-10-CM | POA: Diagnosis not present

## 2015-06-03 DIAGNOSIS — E103291 Type 1 diabetes mellitus with mild nonproliferative diabetic retinopathy without macular edema, right eye: Secondary | ICD-10-CM | POA: Diagnosis not present

## 2015-06-03 LAB — HM DIABETES EYE EXAM

## 2015-06-08 ENCOUNTER — Other Ambulatory Visit: Payer: Self-pay | Admitting: *Deleted

## 2015-06-08 DIAGNOSIS — E1065 Type 1 diabetes mellitus with hyperglycemia: Principal | ICD-10-CM

## 2015-06-08 DIAGNOSIS — IMO0001 Reserved for inherently not codable concepts without codable children: Secondary | ICD-10-CM

## 2015-06-09 ENCOUNTER — Other Ambulatory Visit (INDEPENDENT_AMBULATORY_CARE_PROVIDER_SITE_OTHER): Payer: Medicare Other

## 2015-06-09 DIAGNOSIS — E1065 Type 1 diabetes mellitus with hyperglycemia: Secondary | ICD-10-CM

## 2015-06-09 DIAGNOSIS — IMO0001 Reserved for inherently not codable concepts without codable children: Secondary | ICD-10-CM

## 2015-06-09 LAB — COMPREHENSIVE METABOLIC PANEL
ALBUMIN: 3.9 g/dL (ref 3.5–5.2)
ALK PHOS: 62 U/L (ref 39–117)
ALT: 16 U/L (ref 0–53)
AST: 13 U/L (ref 0–37)
BUN: 30 mg/dL — ABNORMAL HIGH (ref 6–23)
CALCIUM: 9 mg/dL (ref 8.4–10.5)
CO2: 26 mEq/L (ref 19–32)
Chloride: 107 mEq/L (ref 96–112)
Creatinine, Ser: 1.5 mg/dL (ref 0.40–1.50)
GFR: 49.54 mL/min — AB (ref >60.00)
Glucose, Bld: 164 mg/dL — ABNORMAL HIGH (ref 70–99)
POTASSIUM: 4.1 meq/L (ref 3.5–5.1)
Sodium: 140 mEq/L (ref 135–145)
TOTAL PROTEIN: 6.4 g/dL (ref 6.0–8.3)
Total Bilirubin: 0.4 mg/dL (ref 0.2–1.2)

## 2015-06-09 LAB — HEMOGLOBIN A1C: Hgb A1c MFr Bld: 8.1 % — ABNORMAL HIGH (ref 4.6–6.5)

## 2015-06-09 NOTE — Progress Notes (Signed)
Quick Note:  A1c 8.1. Not clear why he had labs and no office visit to follow. ______

## 2015-06-10 LAB — C-PEPTIDE

## 2015-06-10 LAB — GLUCOSE, FASTING: GLUCOSE, PLASMA: 167 mg/dL — AB (ref 65–99)

## 2015-06-14 NOTE — Progress Notes (Signed)
Quick Note:  Labs needed for pump approval ______

## 2015-06-28 ENCOUNTER — Other Ambulatory Visit: Payer: Self-pay | Admitting: Endocrinology

## 2015-06-30 ENCOUNTER — Telehealth: Payer: Self-pay | Admitting: Endocrinology

## 2015-06-30 ENCOUNTER — Telehealth: Payer: Self-pay | Admitting: Nutrition

## 2015-06-30 NOTE — Telephone Encounter (Signed)
Pt calling wanting to speak to you about a prescription that he has sent [employer]? He didn't know the name of it, but said it was some kind of injection.  He said he had spoken with Dr. Dwyane Dee about this previously, but wanted to talk to you as well.

## 2015-06-30 NOTE — Telephone Encounter (Signed)
PT requests call back from you at your earliest convenience °

## 2015-06-30 NOTE — Telephone Encounter (Signed)
Mr. Crafts said he spoke with you at his last visit about how he keeps running out of his pump supplies.  He asked you if you would change the prescription to [facility] saying that he can change it every two days, he's calling them now to have them fax [place] the form.

## 2015-06-30 NOTE — Telephone Encounter (Signed)
PT request a call back with you at your earliest convenience

## 2015-07-05 NOTE — Telephone Encounter (Signed)
I have not seen any information from [place]

## 2015-07-06 NOTE — Telephone Encounter (Signed)
I have tried to call him X2.  No answering machine.

## 2015-07-21 NOTE — Telephone Encounter (Signed)
Patient scheduled for new pump start on 08/09/15

## 2015-07-28 ENCOUNTER — Telehealth: Payer: Self-pay | Admitting: Nutrition

## 2015-07-28 NOTE — Telephone Encounter (Signed)
Pump start scheduled for 08/09/15

## 2015-08-02 ENCOUNTER — Other Ambulatory Visit: Payer: Self-pay | Admitting: *Deleted

## 2015-08-02 ENCOUNTER — Encounter: Payer: Medicare Other | Admitting: Endocrinology

## 2015-08-02 DIAGNOSIS — E108 Type 1 diabetes mellitus with unspecified complications: Principal | ICD-10-CM

## 2015-08-02 DIAGNOSIS — E1065 Type 1 diabetes mellitus with hyperglycemia: Secondary | ICD-10-CM

## 2015-08-02 DIAGNOSIS — IMO0002 Reserved for concepts with insufficient information to code with codable children: Secondary | ICD-10-CM

## 2015-08-02 DIAGNOSIS — E291 Testicular hypofunction: Secondary | ICD-10-CM

## 2015-08-02 LAB — MICROALBUMIN / CREATININE URINE RATIO
CREATININE, U: 92 mg/dL
MICROALB/CREAT RATIO: 0.8 mg/g (ref 0.0–30.0)
Microalb, Ur: <0.7 mg/dL (ref 0.0–1.9)

## 2015-08-02 LAB — URINALYSIS
BILIRUBIN URINE: NEGATIVE
HGB URINE DIPSTICK: NEGATIVE
LEUKOCYTES UA: NEGATIVE
NITRITE: NEGATIVE
Specific Gravity, Urine: 1.02 (ref 1.000–1.030)
TOTAL PROTEIN, URINE-UPE24: NEGATIVE
Urine Glucose: >=1000 — AB
Urobilinogen, UA: 0.2 (ref 0.0–1.0)
pH: 5 (ref 5.0–8.0)

## 2015-08-02 LAB — COMPREHENSIVE METABOLIC PANEL
ALBUMIN: 3.9 g/dL (ref 3.5–5.2)
ALT: 17 U/L (ref 0–53)
AST: 13 U/L (ref 0–37)
Alkaline Phosphatase: 72 U/L (ref 39–117)
BUN: 49 mg/dL — ABNORMAL HIGH (ref 6–23)
CALCIUM: 8.8 mg/dL (ref 8.4–10.5)
CHLORIDE: 104 meq/L (ref 96–112)
CO2: 23 mEq/L (ref 19–32)
Creatinine, Ser: 1.53 mg/dL — ABNORMAL HIGH (ref 0.40–1.50)
GFR: 48.4 mL/min — ABNORMAL LOW (ref >60.00)
Glucose, Bld: 163 mg/dL — ABNORMAL HIGH (ref 70–99)
Potassium: 4 mEq/L (ref 3.5–5.1)
Sodium: 137 mEq/L (ref 135–145)
TOTAL PROTEIN: 6.3 g/dL (ref 6.0–8.3)
Total Bilirubin: 0.4 mg/dL (ref 0.2–1.2)

## 2015-08-02 LAB — LIPID PANEL
CHOLESTEROL: 134 mg/dL (ref 0–200)
HDL: 28.8 mg/dL — ABNORMAL LOW (ref >39.00)
NonHDL: 104.88
Total CHOL/HDL Ratio: 5
Triglycerides: 308 mg/dL — ABNORMAL HIGH (ref 0.0–149.0)
VLDL: 61.6 mg/dL — AB (ref 0.0–40.0)

## 2015-08-02 LAB — TESTOSTERONE: TESTOSTERONE: 230.09 ng/dL — AB (ref 300.00–890.00)

## 2015-08-02 LAB — LDL CHOLESTEROL, DIRECT: LDL DIRECT: 67 mg/dL

## 2015-08-03 LAB — FRUCTOSAMINE: Fructosamine: 256 umol/L (ref 0–285)

## 2015-08-05 ENCOUNTER — Encounter: Payer: Self-pay | Admitting: Endocrinology

## 2015-08-05 ENCOUNTER — Other Ambulatory Visit: Payer: Self-pay

## 2015-08-05 ENCOUNTER — Ambulatory Visit (INDEPENDENT_AMBULATORY_CARE_PROVIDER_SITE_OTHER): Payer: Medicare Other | Admitting: Endocrinology

## 2015-08-05 VITALS — BP 140/62 | HR 75 | Wt 262.0 lb

## 2015-08-05 DIAGNOSIS — E291 Testicular hypofunction: Secondary | ICD-10-CM | POA: Diagnosis not present

## 2015-08-05 DIAGNOSIS — E1065 Type 1 diabetes mellitus with hyperglycemia: Secondary | ICD-10-CM | POA: Diagnosis not present

## 2015-08-05 MED ORDER — TESTOSTERONE 50 MG/5GM (1%) TD GEL: 7.5000 g | Freq: Every day | TRANSDERMAL | Status: DC

## 2015-08-05 NOTE — Patient Instructions (Addendum)
Bolus 10 min before Breakfast  Androgel 1 1/2 tubes daily  Check sugar 4x daily  Basal 8am to 10am 2.8

## 2015-08-05 NOTE — Progress Notes (Signed)
Patient ID: Paul Adkins, male   DOB: [birth year], 70 y.o.   MRN: [id number]   Reason for visit: Diabetes and hypertension follow-up  Diagnosis: Type 1 diabetes, date of onset 1978  PAST history: He has had persistently poorly controlled diabetes for several years. A1c has been high with usual range 8.2 -9, overall improved since 2013. Compliance with glucose monitoring and diet has been variable and he does better when he is checking more sugars. His A1c was better than usual in 3/14 at 7.5 but he was also having significant hypoglycemia overnight and also occasionally later in the day. At that time he was started on Invokana 300 mg daily which helped him with glucose control and mild weight loss  CURRENT insulin pump brand:  Medtronic   PUMP SETTINGS are:   Basal rates: 2.25 from midnight- 4 AM. 4 AM = 2.0.  8:30 AM = 2.6.  Boluses : 1 unit for 6 g carbs at mealtimes and blood sugar target 110-120, sensitivity 1:30 until noon and then 1:25 with active insulin 3 hours.  Changing infusion set every 3 days , has been reusing his set as his insurance will not pay for changing every 2 days  RECENT history:    With restarting his Invokana his A1c had improved to 7.9 in 8/16 and this is now relatively better  Current blood sugar patterns and problems:  He has checked his blood sugars a little more frequently but not consistently especially in the evenings  FRUCTOSAMINE indicates improved control  Most of his high readings are on waking up and occasionally later at night  He says his blood sugars go up with his morning breakfast meal even though he is not eating much carbohydrate, usually half him often with egg and sausage  His carbohydrate coverage has been changed to 1:6, previously 1:8  Minimal hypoglycemia, only once documented before supper    Current daily basal is 60 units, recent overall average daily insulin 91 units with 66% in basal Changing infusion set every 3-4  days  GLUCOSE readings: Reviewed for the last 2 weeks, on an average checking on average less than 2 times a day  Mean values apply above for all meters except median for One Touch  PRE-MEAL Fasting Lunch Dinner Bedtime Overall  Glucose range: 70-275  74-182  58-197  110-249    Mean/median: 156  143   185  157   POST-MEAL PC Breakfast PC Lunch PC Dinner  Glucose range: 198, 258     Mean/median:      Physical activity: He is using exercise bike occasionally   Wt Readings from Last 3 Encounters:  08/05/15 262 lb (118.842 kg)  05/04/15 263 lb 12.8 oz (119.659 kg)  02/03/15 263 lb (119.296 kg)    Lab Results  Component Value Date   HGBA1C 8.1* 06/09/2015   HGBA1C 8.2* 04/29/2015   HGBA1C 7.9 02/03/2015   Lab Results  Component Value Date   MICROALBUR <0.7 08/02/2015   [place] 78 10/05/2014   CREATININE 1.53* 08/02/2015     Office Visit on 08/02/2015  Component Date Value Ref Range Status  . Sodium 08/02/2015 137  135 - 145 mEq/L Final  . Potassium 08/02/2015 4.0  3.5 - 5.1 mEq/L Final  . Chloride 08/02/2015 104  96 - 112 mEq/L Final  . CO2 08/02/2015 23  19 - 32 mEq/L Final  . Glucose, Bld 08/02/2015 163* 70 - 99 mg/dL Final  . BUN 08/02/2015 49* 6 - 23  mg/dL Final  . Creatinine, Ser 08/02/2015 1.53* 0.40 - 1.50 mg/dL Final  . Total Bilirubin 08/02/2015 0.4  0.2 - 1.2 mg/dL Final  . Alkaline Phosphatase 08/02/2015 72  39 - 117 U/L Final  . AST 08/02/2015 13  0 - 37 U/L Final  . ALT 08/02/2015 17  0 - 53 U/L Final  . Total Protein 08/02/2015 6.3  6.0 - 8.3 g/dL Final  . Albumin 08/02/2015 3.9  3.5 - 5.2 g/dL Final  . Calcium 08/02/2015 8.8  8.4 - 10.5 mg/dL Final  . GFR 08/02/2015 48.40* >60.00 mL/min Final  . Fructosamine 08/02/2015 256  0 - 285 umol/L Final   Comment: Published reference interval for apparently healthy subjects between age 56 and 49 is 66 - 285 umol/L and in a poorly controlled diabetic population is 228 - 563 umol/L with a mean of 396 umol/L.    Marland Kitchen Color, Urine 08/02/2015 YELLOW  Yellow;Lt. Yellow Final  . APPearance 08/02/2015 CLEAR  Clear Final  . Specific Gravity, Urine 08/02/2015 1.020  1.000-1.030 Final  . pH 08/02/2015 5.0  5.0 - 8.0 Final  . Total Protein, Urine 08/02/2015 NEGATIVE  Negative Final  . Urine Glucose 08/02/2015 >=1000* Negative Final  . Ketones, ur 08/02/2015 TRACE* Negative Final  . Bilirubin Urine 08/02/2015 NEGATIVE  Negative Final  . Hgb urine dipstick 08/02/2015 NEGATIVE  Negative Final  . Urobilinogen, UA 08/02/2015 0.2  0.0 - 1.0 Final  . Leukocytes, UA 08/02/2015 NEGATIVE  Negative Final  . Nitrite 08/02/2015 NEGATIVE  Negative Final  . Microalb, Ur 08/02/2015 <0.7  0.0 - 1.9 mg/dL Final  . Creatinine,U 08/02/2015 92.0   Final  . Microalb Creat Ratio 08/02/2015 0.8  0.0 - 30.0 mg/g Final  . Cholesterol 08/02/2015 134  0 - 200 mg/dL Final   ATP III Classification       Desirable:  < 200 mg/dL               Borderline High:  200 - 239 mg/dL          High:  > = 240 mg/dL  . Triglycerides 08/02/2015 308.0* 0.0 - 149.0 mg/dL Final   Normal:  <150 mg/dLBorderline High:  150 - 199 mg/dL  . HDL 08/02/2015 28.80* >39.00 mg/dL Final  . VLDL 08/02/2015 61.6* 0.0 - 40.0 mg/dL Final  . Total CHOL/HDL Ratio 08/02/2015 5   Final                  Men          Women1/2 Average Risk     3.4          3.3Average Risk          5.0          4.42X Average Risk          9.6          7.13X Average Risk          15.0          11.0                      . NonHDL 08/02/2015 104.88   Final   NOTE:  Non-HDL goal should be 30 mg/dL higher than patient's LDL goal (i.e. LDL goal of < 70 mg/dL, would have non-HDL goal of < 100 mg/dL)  . Testosterone 08/02/2015 230.09* 300.00 - 890.00 ng/dL Final  . Direct LDL 08/02/2015 67.0   Final   Optimal:  <  100 mg/dLNear or Above Optimal:  100-129 mg/dLBorderline High:  130-159 mg/dLHigh:  160-189 mg/dLVery High:  >190 mg/dL      Medication List       This list is accurate as of: 08/05/15   9:46 AM.  Always use your most recent med list.               amLODipine 5 MG tablet  Commonly known as:  NORVASC  TAKE 1 TABLET (5 MG TOTAL) BY MOUTH DAILY.     aspirin 81 MG tablet  Take 81 mg by mouth daily.     atorvastatin 20 MG tablet  Commonly known as:  LIPITOR  TAKE 1 TABLET (20 MG TOTAL) BY MOUTH DAILY.     BOOSTRIX 5-2.5-18.5 LF-MCG/0.5 injection  Generic drug:  Tdap     glucose blood test strip  Commonly known as:  BAYER CONTOUR NEXT TEST  Use as instructed to check blood sugars 4 times per day dx code E10.65     HUMALOG 100 UNIT/ML injection  Generic drug:  insulin lispro  USE MAX 110 UNITS PER DAY WITH INSULIN PUMP     INVOKANA 300 MG Tabs tablet  Generic drug:  canagliflozin  TAKE 1 TABLET BY MOUTH EVERY DAY     ketorolac 0.5 % ophthalmic solution  Commonly known as:  ACULAR     lisinopril-hydrochlorothiazide 20-12.5 MG tablet  Commonly known as:  ZESTORETIC  Take 1 tablet by mouth daily.     methylphenidate 10 MG CR capsule  Commonly known as:  METADATE CD  Take 1 capsule (10 mg total) by mouth every morning.     ofloxacin 0.3 % ophthalmic solution  Commonly known as:  OCUFLOX     PARoxetine 40 MG tablet  Commonly known as:  PAXIL  Take 1 tablet (40 mg total) by mouth every morning.     prednisoLONE acetate 1 % ophthalmic suspension  Commonly known as:  PRED FORTE     testosterone 50 MG/5GM (1%) Gel  Commonly known as:  ANDROGEL  Place 7.5 g onto the skin daily. Use 1 1/2 tubes daily as directed        Allergies:  Allergies  Allergen Reactions  . Viagra [Sildenafil Citrate]     Past Medical History  Diagnosis Date  . Diabetes mellitus without complication ([place])   . ED (erectile dysfunction)   . Neuropathy ([place])   . Hypertension   . Hyperlipidemia   . Depression   . Carpal tunnel syndrome   . Edema   . Lumbar disc disease     Past Surgical History  Procedure Laterality Date  . Back surgery    . Vasectomy      Family  History  Problem Relation Age of Onset  . Cancer Father     Prostate  . Diabetes Neg Hx     Social History:  reports that he has never smoked. He has never used smokeless tobacco. His alcohol and drug histories are not on file.  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  Currently on AndroGel 5 g packets and is compliant with this However he is complaining of feeling more sluggish and his level is below normal now, previously fairly consistently good.  His insurance does not cover AndroGel 1.62%   Lab Results  Component Value Date   TESTOSTERONE 230.09* 08/02/2015     HYPERTENSION: Currently taking lisinopril HCT with his amlodipine No  edema  Also his creatinine tends to be upper normal without evidence of proteinuria This appears to be same with reducing Invokana to half tablet   Lab Results  Component Value Date   CREATININE 1.53* 08/02/2015   BUN 49* 08/02/2015   NA 137 08/02/2015   K 4.0 08/02/2015   CL 104 08/02/2015   CO2 23 08/02/2015    Hypercholesterolemia: Well controlled on Lipitor20 mg, tends to have low HDL  Lab Results  Component Value Date   CHOL 134 08/02/2015   HDL 28.80* 08/02/2015   LDLCALC 78 10/05/2014   LDLDIRECT 67.0 08/02/2015   TRIG 308.0* 08/02/2015   CHOLHDL 5 08/02/2015    EXAM:   BP 140/62 mmHg  Pulse 75  Wt 262 lb (118.842 kg)  SpO2 96%  No ankle edema  ASSESSMENT:  DIABETES: See history of present illness for detailed discussion on current blood sugar patterns  and problems identified His A1c has been over 8% consistently However fructosamine is in the normal range indicating overall better control Is still unable to lose weight  He is probably better compliant with diet and with improving his carbohydrate coverage with the ratio 1:6 is doing better He usually does better when he is checking his blood sugars more often and is overall doing better with this He is more motivated and also planning to get the 630 pump, currently not getting insurance coverage for the sensor Has exercise a little which may be helping also  Since most of his high readings are early morning around 8-10 AM including after his first meal will change his basal rate to 2.8 and this time slot Encouraged him to exercise more regularly and have low fat meals  HYPERTENSION: Controlled Continue lisinopril HCT and amlodipine, consider changing lisinopril to Avapro if creatinine stays high  Mild renal dysfunction: Stable, will continue low-dose  Invokana   Hypogonadism: His level is relatively low for no apparent reason, will increase his dose to 7.5 g Consider switching to Testim   Patient Instructions  Bolus 10 min before Breakfast  Androgel 1 1/2 tubes daily  Check sugar 4x daily    Counseling time on subjects discussed above is over 50% of today's 25 minute visit     [facility] 08/05/2015

## 2015-08-06 ENCOUNTER — Telehealth: Payer: Self-pay | Admitting: Endocrinology

## 2015-08-06 NOTE — Telephone Encounter (Signed)
Please forward this to [facility], I have not made any appointments

## 2015-08-06 NOTE — Telephone Encounter (Signed)
Pt is asking why he has to be seen in follow up after the pump start because he has been using a pump for a while

## 2015-08-06 NOTE — Telephone Encounter (Signed)
Paul Adkins, See below, thanks

## 2015-08-06 NOTE — Telephone Encounter (Signed)
See note below and please advise, Thanks! 

## 2015-08-09 ENCOUNTER — Encounter: Payer: Medicare Other | Attending: Endocrinology | Admitting: Nutrition

## 2015-08-09 DIAGNOSIS — E1065 Type 1 diabetes mellitus with hyperglycemia: Secondary | ICD-10-CM

## 2015-08-09 NOTE — Telephone Encounter (Signed)
Pt. Notified that he does not need to be seen for followup

## 2015-08-10 ENCOUNTER — Ambulatory Visit: Payer: Medicare Other | Admitting: Endocrinology

## 2015-08-10 NOTE — Progress Notes (Signed)
Paul Adkins was instructed on the use of the Medtronic 630 G insulin pump. We reviewed all of the new features, and he re demonstrated how to give a bolus, how to use the temp basal rate, how to put the pump in suspend, and how to change basal rates. He put in all of the pump settings with instruction from me, but had previously put in date/time before coming today.   Settings were transferred from his old pump.   We sunk his pump to his new Contour meter, and he reviewed again how to give a bolus.  He did this correctly. He signed off as understanding all topics:  See check list, and had no final questions.

## 2015-08-10 NOTE — Patient Instructions (Signed)
Review the manual again and call if questions.

## 2015-08-17 ENCOUNTER — Telehealth: Payer: Self-pay | Admitting: Endocrinology

## 2015-08-17 NOTE — Telephone Encounter (Signed)
I contacted the Paul Adkins and left a vm advising the blank letter was put in epic so [name] could print and add the information she needed for the Paul Adkins's insulin pump. Due to [facility] not being under [facility] the blank letter with the [facility] letter head was formulated to have the needed pump information added for the Paul Adkins.

## 2015-08-17 NOTE — Telephone Encounter (Signed)
Patient stated he got a letter from you in my chart, but it was blank, please advise

## 2015-10-20 ENCOUNTER — Encounter (HOSPITAL_COMMUNITY): Payer: Self-pay | Admitting: *Deleted

## 2015-10-20 ENCOUNTER — Emergency Department (HOSPITAL_COMMUNITY): Payer: Medicare Other

## 2015-10-20 ENCOUNTER — Emergency Department (HOSPITAL_COMMUNITY)
Admission: EM | Admit: 2015-10-20 | Discharge: 2015-10-20 | Disposition: A | Discharge status: Home / Self Care | Payer: Medicare Other | Attending: Emergency Medicine | Admitting: Emergency Medicine

## 2015-10-20 DIAGNOSIS — Z7982 Long term (current) use of aspirin: Secondary | ICD-10-CM | POA: Diagnosis not present

## 2015-10-20 DIAGNOSIS — E109 Type 1 diabetes mellitus without complications: Secondary | ICD-10-CM | POA: Insufficient documentation

## 2015-10-20 DIAGNOSIS — I10 Essential (primary) hypertension: Secondary | ICD-10-CM | POA: Diagnosis not present

## 2015-10-20 DIAGNOSIS — H532 Diplopia: Secondary | ICD-10-CM | POA: Diagnosis present

## 2015-10-20 DIAGNOSIS — Z79899 Other long term (current) drug therapy: Secondary | ICD-10-CM | POA: Insufficient documentation

## 2015-10-20 DIAGNOSIS — R42 Dizziness and giddiness: Secondary | ICD-10-CM | POA: Diagnosis not present

## 2015-10-20 HISTORY — DX: Cerebral infarction, unspecified: I63.9

## 2015-10-20 LAB — APTT: aPTT: 28 seconds (ref 24–36)

## 2015-10-20 LAB — CBC
HCT: 45.1 % (ref 39.0–52.0)
HEMOGLOBIN: 15.3 g/dL (ref 13.0–17.0)
MCH: 29.5 pg (ref 26.0–34.0)
MCHC: 33.9 g/dL (ref 30.0–36.0)
MCV: 86.9 fL (ref 78.0–100.0)
Platelets: 220 10*3/uL (ref 150–400)
RBC: 5.19 MIL/uL (ref 4.22–5.81)
RDW: 13.7 % (ref 11.5–15.5)
WBC: 5.7 10*3/uL (ref 4.0–10.5)

## 2015-10-20 LAB — DIFFERENTIAL
Basophils Absolute: 0 10*3/uL (ref 0.0–0.1)
Basophils Relative: 1 %
EOS PCT: 2 %
Eosinophils Absolute: 0.1 10*3/uL (ref 0.0–0.7)
LYMPHS ABS: 1.6 10*3/uL (ref 0.7–4.0)
LYMPHS PCT: 29 %
MONO ABS: 0.4 10*3/uL (ref 0.1–1.0)
Monocytes Relative: 7 %
Neutro Abs: 3.5 10*3/uL (ref 1.7–7.7)
Neutrophils Relative %: 61 %

## 2015-10-20 LAB — COMPREHENSIVE METABOLIC PANEL
ALK PHOS: 71 U/L (ref 38–126)
ALT: 21 U/L (ref 17–63)
ANION GAP: 6 (ref 5–15)
AST: 17 U/L (ref 15–41)
Albumin: 4.3 g/dL (ref 3.5–5.0)
BILIRUBIN TOTAL: 0.7 mg/dL (ref 0.3–1.2)
BUN: 28 mg/dL — ABNORMAL HIGH (ref 6–20)
CALCIUM: 9.1 mg/dL (ref 8.9–10.3)
CO2: 26 mmol/L (ref 22–32)
Chloride: 107 mmol/L (ref 101–111)
Creatinine, Ser: 1.38 mg/dL — ABNORMAL HIGH (ref 0.61–1.24)
GFR calc non Af Amer: 51 mL/min — ABNORMAL LOW (ref >60)
GFR, EST AFRICAN AMERICAN: 60 mL/min — AB (ref >60)
Glucose, Bld: 208 mg/dL — ABNORMAL HIGH (ref 65–99)
Potassium: 4.2 mmol/L (ref 3.5–5.1)
Sodium: 139 mmol/L (ref 135–145)
TOTAL PROTEIN: 7.2 g/dL (ref 6.5–8.1)

## 2015-10-20 LAB — I-STAT CHEM 8, ED
BUN: 30 mg/dL — AB (ref 6–20)
CALCIUM ION: 1.13 mmol/L (ref 1.12–1.23)
Chloride: 103 mmol/L (ref 101–111)
Creatinine, Ser: 1.3 mg/dL — ABNORMAL HIGH (ref 0.61–1.24)
Glucose, Bld: 205 mg/dL — ABNORMAL HIGH (ref 65–99)
HCT: 46 % (ref 39.0–52.0)
Hemoglobin: 15.6 g/dL (ref 13.0–17.0)
Potassium: 4.1 mmol/L (ref 3.5–5.1)
SODIUM: 141 mmol/L (ref 135–145)
TCO2: 25 mmol/L (ref 0–100)

## 2015-10-20 LAB — I-STAT TROPONIN, ED: Troponin i, poc: 0 ng/mL (ref 0.00–0.08)

## 2015-10-20 LAB — PROTIME-INR
INR: 0.95
PROTHROMBIN TIME: 12.7 s (ref 11.4–15.2)

## 2015-10-20 NOTE — Progress Notes (Signed)
Inpatient Diabetes Program Recommendations  AACE/ADA: New Consensus Statement on Inpatient Glycemic Control (2015)  Target Ranges:  Prepandial:   less than 140 mg/dL      Peak postprandial:   less than 180 mg/dL (1-2 hours)      Critically ill patients:  140 - 180 mg/dL   Lab Results  Component Value Date   HGBA1C 8.1 (H) 06/09/2015    Review of Glycemic Control  Diabetes history: DM1 Outpatient Diabetes medications: Insulin pump - see settings below, Invokana 150 mg QD Current orders for Inpatient glycemic control: None  PUMP SETTINGS are:  Basal rates: 2.25 from midnight- 4 AM. 4 AM = 2.0.  8:30 AM = 2.6.  Boluses : 1 unit for 8 g carbs at mealtimes and blood sugar target 110-120, sensitivity 1:30 until noon and then 1:25 with active insulin 3 hours.  Changing infusion set every 3-4 days    Inpatient Diabetes Program Recommendations:    Please order Insulin Pump Order Set Needs updated HgbA1C to assess glycemic control prior to ED visit.  Will follow if admitted. Thank you. Lorenda Peck, RD, LDN, CDE Inpatient Diabetes Coordinator [phone]

## 2015-10-20 NOTE — ED Triage Notes (Signed)
Pt reports double vision, dizziness when turning his head to the side and unsteady gait for 24 hours.  Has hx of CVA x 10 years ago.  Pt is A&Ox 4

## 2015-10-20 NOTE — ED Provider Notes (Signed)
[facility] DEPT Provider Note   CSN: [id number] Arrival date & time: 10/20/15  1321     History   Chief Complaint Chief Complaint  Patient presents with  . visual changes  . unsteady gait    HPI Paul Adkins is a 70 y.o. male.  He presents for evaluation of "double vision", and difficulty walking because of vision trouble.The symptoms started yesterday, about 24 hours ago. He also has a sensation of dizziness when he turns his head or moves his eyes. He was able to drive his vehicle here for the evaluation. No similar problem in the past. He denies fever, chills, nausea, vomiting, cough, shortness of breath, chest pain, paresthesias. He is taking his usual medications. There are no other known modifying factors.     HPI  Past Medical History:  Diagnosis Date  . Carpal tunnel syndrome   . Depression   . Diabetes mellitus without complication ([place])   . ED (erectile dysfunction)   . Edema   . Hyperlipidemia   . Hypertension   . Lumbar disc disease   . Neuropathy ([place])   . Stroke [facility]     Patient Active Problem List   Diagnosis Date Noted  . Obstructive sleep apnea 08/26/2013  . Type I (juvenile type) diabetes mellitus without mention of complication, uncontrolled 09/04/2012  . Essential hypertension, benign 09/04/2012  . Hypogonadism male 09/04/2012  . Depression 09/04/2012  . Pure hypercholesterolemia 09/04/2012  . Severe obesity (BMI >= 40) ([place]) 09/04/2012    Past Surgical History:  Procedure Laterality Date  . BACK SURGERY    . VASECTOMY         Home Medications    Prior to Admission medications   Medication Sig Start Date End Date Taking? Authorizing Provider  amLODipine (NORVASC) 5 MG tablet TAKE 1 TABLET (5 MG TOTAL) BY MOUTH DAILY. 05/25/15  Yes Elayne Snare, MD  aspirin 81 MG tablet Take 81 mg by mouth daily.   Yes Historical Provider, MD  atorvastatin (LIPITOR) 20 MG tablet TAKE 1 TABLET (20 MG TOTAL) BY MOUTH DAILY. 05/31/15  Yes Elayne Snare,  MD  HUMALOG 100 UNIT/ML injection USE MAX 110 UNITS PER DAY WITH INSULIN PUMP 06/28/15  Yes Elayne Snare, MD  INVOKANA 300 MG TABS tablet TAKE 1 TABLET BY MOUTH EVERY DAY Patient taking differently: TAKE HALF A TABLET BY MOUTH EVERY DAY 12/08/14  Yes Elayne Snare, MD  lisinopril-hydrochlorothiazide (ZESTORETIC) 20-12.5 MG per tablet Take 1 tablet by mouth daily. 10/08/14  Yes Elayne Snare, MD  PARoxetine (PAXIL) 40 MG tablet Take 1 tablet (40 mg total) by mouth every morning. Patient taking differently: Take 40 mg by mouth at bedtime.  05/25/15  Yes Elayne Snare, MD  testosterone (ANDROGEL) 50 MG/5GM (1%) GEL Place 7.5 g onto the skin daily. Use 1 1/2 tubes daily as directed Patient taking differently: Place 7.5 g onto the skin daily.  08/05/15  Yes Elayne Snare, MD  glucose blood (BAYER CONTOUR NEXT TEST) test strip Use as instructed to check blood sugars 4 times per day dx code E10.65 01/26/14   Elayne Snare, MD  methylphenidate (METADATE CD) 10 MG CR capsule Take 1 capsule (10 mg total) by mouth every morning. Patient not taking: Reported on 10/20/2015 02/03/15   Elayne Snare, MD    Family History Family History  Problem Relation Age of Onset  . Cancer Father     Prostate  . Diabetes Neg Hx     Social History Social History  Substance Use Topics  .  Smoking status: Never Smoker  . Smokeless tobacco: Never Used  . Alcohol use No     Allergies   Viagra [sildenafil citrate]   Review of Systems Review of Systems  All other systems reviewed and are negative.    Physical Exam Updated Vital Signs BP 132/72   Pulse 61   Temp 98.8 F (37.1 C) (Oral)   Resp 17   Ht 5\' 7"  (1.702 m)   Wt 260 lb (117.9 kg)   SpO2 98%   BMI 40.72 kg/m   Physical Exam  Constitutional: He is oriented to person, place, and time. He appears well-developed and well-nourished.  HENT:  Head: Normocephalic and atraumatic.  Right Ear: External ear normal.  Left Ear: External ear normal.  Eyes: Conjunctivae and EOM  are normal. Pupils are equal, round, and reactive to light.  Neck: Normal range of motion and phonation normal. Neck supple.  Cardiovascular: Normal rate, regular rhythm and normal heart sounds.   Pulmonary/Chest: Effort normal and breath sounds normal. He exhibits no bony tenderness.  Abdominal: Soft. There is no tenderness.  Musculoskeletal: Normal range of motion.  Neurological: He is alert and oriented to person, place, and time. No cranial nerve deficit or sensory deficit. He exhibits normal muscle tone. Coordination normal.  No dysarthria and aphasia or nystagmus. Head impulse, causes dizziness. Mild left hand dysmetria. No central discordant gaze, questionable left lateral globe movement deficit.  Skin: Skin is warm, dry and intact.  Psychiatric: He has a normal mood and affect. His behavior is normal. Judgment and thought content normal.  Nursing note and vitals reviewed.    ED Treatments / Results  Labs (all labs ordered are listed, but only abnormal results are displayed) Labs Reviewed  COMPREHENSIVE METABOLIC PANEL - Abnormal; Notable for the following:       Result Value   Glucose, Bld 208 (*)    BUN 28 (*)    Creatinine, Ser 1.38 (*)    GFR calc non Af Amer 51 (*)    GFR calc Af Amer 60 (*)    All other components within normal limits  I-STAT CHEM 8, ED - Abnormal; Notable for the following:    BUN 30 (*)    Creatinine, Ser 1.30 (*)    Glucose, Bld 205 (*)    All other components within normal limits  PROTIME-INR  APTT  CBC  DIFFERENTIAL  I-STAT TROPOININ, ED  CBG MONITORING, ED    EKG  EKG Interpretation  Date/Time:  Wednesday October 20 2015 14:31:22 EDT Ventricular Rate:  61 PR Interval:    QRS Duration: 114 QT Interval:  402 QTC Calculation: 405 R Axis:   64 Text Interpretation:  Sinus rhythm Incomplete right bundle branch block Since last tracing of earlier today No significant change was found Confirmed by Eulis Foster  MD, Vira Agar [phone]) on 10/20/2015  5:02:05 PM       Radiology Mr Brain Wo Contrast  Result Date: 10/20/2015 CLINICAL DATA:  Acute onset vertigo EXAM: MRI HEAD WITHOUT CONTRAST TECHNIQUE: Multiplanar, multiecho pulse sequences of the brain and surrounding structures were obtained without intravenous contrast. COMPARISON:  None. FINDINGS: Brain Parenchyma: No acute infarct or intraparenchymal hemorrhage. Mild periventricular hyperintensity. Prominent left lenticulostriate perivascular spaces. No mass lesion or midline shift. The major intracranial flow voids are preserved. The midline structures are normal. Ventricles, Sulci and Extra-axial Spaces: Normal for age. No extra-axial collection. Paranasal Sinuses and Mastoids: No fluid levels or advanced mucosal thickening. Small cystic focus within the nasopharynx. Orbits:  Normal. Bones and Soft Tissues: The visualized skull base, calvarium and extracranial soft tissues are normal. IMPRESSION: No acute intracranial abnormality. No finding to explain the patient's vertigo. Electronically Signed   By: Ulyses Jarred M.D.   On: 10/20/2015 16:22    Procedures Procedures (including critical care time)  Medications Ordered in ED Medications - No data to display   Initial Impression / Assessment and Plan / ED Course  I have reviewed the triage vital signs and the nursing notes.  Pertinent labs & imaging results that were available during my care of the patient were reviewed by me and considered in my medical decision making (see chart for details).  Clinical Course  Comment By Time  At this time there is no indication for thrombolysis because of onset of symptoms yesterday. CT brain canceled, will proceed with MRI Daleen Bo, MD 08/23 1415    Medications - No data to display  Patient Vitals for the past 24 hrs:  BP Temp Temp src Pulse Resp SpO2 Height Weight  10/20/15 1415 132/72 - - 61 17 98 % - -  10/20/15 1407 141/61 98.8 F (37.1 C) Oral 64 16 98 % - -  10/20/15 1401 -  98.8 F (37.1 C) - - - - - -  10/20/15 1336 (!) 131/103 98.9 F (37.2 C) - 66 16 96 % - -  10/20/15 1333 - - - - - - 5\' 7"  (1.702 m) 260 lb (117.9 kg)    6:07 PM Reevaluation with update and discussion. After initial assessment and treatment, an updated evaluation reveals he states his dizziness is almost gone and he only has occasional double vision. He is able to ambulate easily without symptoms. Findings discussed with the patient and all questions were answered. Nhi Butrum L    Final Clinical Impressions(s) / ED Diagnoses   Final diagnoses:  Dizziness   Nonspecific dizziness, symptoms, transient, without evidence for CVA, on MRI imaging.  Nursing Notes Reviewed/ Care Coordinated Applicable Imaging Reviewed Interpretation of Laboratory Data incorporated into ED treatment  The patient appears reasonably screened and/or stabilized for discharge and I doubt any other medical condition or other [facility] requiring further screening, evaluation, or treatment in the ED at this time prior to discharge.  Plan: Home Medications- continue; Home Treatments- rest, fluids; return here if the recommended treatment, does not improve the symptoms; Recommended follow up- PCP, check up in 5 days, return here if needed.  New Prescriptions New Prescriptions   No medications on file     Daleen Bo, MD 10/20/15 [phone]

## 2015-10-20 NOTE — ED Notes (Signed)
Ambulated in hallway with no assistance. Patient denied dizziness.

## 2015-10-21 ENCOUNTER — Other Ambulatory Visit: Payer: Self-pay | Admitting: Endocrinology

## 2015-10-21 LAB — CBG MONITORING, ED: GLUCOSE-CAPILLARY: 190 mg/dL — AB (ref 65–99)

## 2015-10-24 ENCOUNTER — Other Ambulatory Visit: Payer: Self-pay | Admitting: Endocrinology

## 2015-10-26 ENCOUNTER — Ambulatory Visit (INDEPENDENT_AMBULATORY_CARE_PROVIDER_SITE_OTHER): Payer: Medicare Other | Admitting: Endocrinology

## 2015-10-26 ENCOUNTER — Encounter: Payer: Self-pay | Admitting: Endocrinology

## 2015-10-26 VITALS — BP 142/77 | HR 70 | Ht 67.0 in | Wt 261.0 lb

## 2015-10-26 DIAGNOSIS — H4922 Sixth [abducent] nerve palsy, left eye: Secondary | ICD-10-CM | POA: Diagnosis not present

## 2015-10-26 NOTE — Progress Notes (Signed)
Subjective:     Patient ID: Paul Adkins, male   DOB: 1946-01-15, 70 y.o.   MRN: [id number]  HPI  CHIEF complaint: Double vision  He started having dizziness and double vision on 8/22 He was seen in the emergency room and evaluated with an MRI and labs and was told that he did not have a stroke and was referred back for follow-up He says that he is having double vision mostly for objects within the room but not while reading and is able to still drive with less double vision for distant objects. He is having only a mild dull headache and discomfort in the left eye  Dizziness: He has only swimmy headedness for a few seconds when he is getting up from bed or turning in bed.  No significant nausea. He has no difficulty with his balance, speech or any trouble with weakness in his extremities or numbness     Medication List       Accurate as of 10/26/15  9:08 AM. Always use your most recent med list.          amLODipine 5 MG tablet Commonly known as:  NORVASC TAKE 1 TABLET (5 MG TOTAL) BY MOUTH DAILY.   aspirin 81 MG tablet Take 81 mg by mouth daily.   atorvastatin 20 MG tablet Commonly known as:  LIPITOR TAKE 1 TABLET (20 MG TOTAL) BY MOUTH DAILY.   glucose blood test strip Commonly known as:  BAYER CONTOUR NEXT TEST Use as instructed to check blood sugars 4 times per day dx code E10.65   HUMALOG 100 UNIT/ML injection Generic drug:  insulin lispro USE MAX 110 UNITS PER DAY WITH INSULIN PUMP   INVOKANA 300 MG Tabs tablet Generic drug:  canagliflozin TAKE 1 TABLET BY MOUTH EVERY DAY   lisinopril-hydrochlorothiazide 20-12.5 MG tablet Commonly known as:  PRINZIDE,ZESTORETIC TAKE 1 TABLET BY MOUTH DAILY.   methylphenidate 10 MG CR capsule Commonly known as:  METADATE CD Take 1 capsule (10 mg total) by mouth every morning.   PARoxetine 40 MG tablet Commonly known as:  PAXIL Take 1 tablet (40 mg total) by mouth every morning.   testosterone 50 MG/5GM (1%)  Gel Commonly known as:  ANDROGEL Place 7.5 g onto the skin daily. Use 1 1/2 tubes daily as directed         Active Ambulatory Problems    Diagnosis Date Noted  . Type I (juvenile type) diabetes mellitus without mention of complication, uncontrolled 09/04/2012  . Essential hypertension, benign 09/04/2012  . Hypogonadism male 09/04/2012  . Depression 09/04/2012  . Pure hypercholesterolemia 09/04/2012  . Severe obesity (BMI >= 40) ([place]) 09/04/2012  . Obstructive sleep apnea 08/26/2013   Resolved Ambulatory Problems    Diagnosis Date Noted  . No Resolved Ambulatory Problems   Past Medical History:  Diagnosis Date  . Carpal tunnel syndrome   . Depression   . Diabetes mellitus without complication ([place])   . ED (erectile dysfunction)   . Edema   . Hyperlipidemia   . Hypertension   . Lumbar disc disease   . Neuropathy ([place])   . Stroke [facility])      Review of Systems  He has had type I diabetes, long-standing on insulin pump  Lab Results  Component Value Date   HGBA1C 8.1 (H) 06/09/2015   HGBA1C 8.2 (H) 04/29/2015   HGBA1C 7.9 02/03/2015   Lab Results  Component Value Date   MICROALBUR <0.7 08/02/2015   [place] 78 10/05/2014  CREATININE 1.30 (H) 10/20/2015   No unusual headaches as mentioned above No weakness       Objective:   Physical Exam  Extraocular movements show impaired abduction of left eye laterally causing diplopia Other extraocular movements are normal Pupils are normal Fundus exam is normal on the left No neck stiffness present Coordination is normal Upper extremity reflexes are difficult to elicit bilaterally      Assessment:     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

## 2015-11-02 ENCOUNTER — Other Ambulatory Visit: Payer: Medicare Other

## 2015-11-03 ENCOUNTER — Other Ambulatory Visit (INDEPENDENT_AMBULATORY_CARE_PROVIDER_SITE_OTHER): Payer: Medicare Other

## 2015-11-03 DIAGNOSIS — E1065 Type 1 diabetes mellitus with hyperglycemia: Secondary | ICD-10-CM

## 2015-11-03 DIAGNOSIS — E291 Testicular hypofunction: Secondary | ICD-10-CM | POA: Diagnosis not present

## 2015-11-03 LAB — COMPREHENSIVE METABOLIC PANEL
ALT: 16 U/L (ref 0–53)
AST: 15 U/L (ref 0–37)
Albumin: 4 g/dL (ref 3.5–5.2)
Alkaline Phosphatase: 63 U/L (ref 39–117)
BUN: 27 mg/dL — ABNORMAL HIGH (ref 6–23)
CHLORIDE: 105 meq/L (ref 96–112)
CO2: 29 meq/L (ref 19–32)
Calcium: 8.8 mg/dL (ref 8.4–10.5)
Creatinine, Ser: 1.35 mg/dL (ref 0.40–1.50)
GFR: 55.88 mL/min — AB (ref >60.00)
GLUCOSE: 109 mg/dL — AB (ref 70–99)
Potassium: 4 mEq/L (ref 3.5–5.1)
Sodium: 138 mEq/L (ref 135–145)
Total Bilirubin: 0.4 mg/dL (ref 0.2–1.2)
Total Protein: 6.6 g/dL (ref 6.0–8.3)

## 2015-11-03 LAB — HEMOGLOBIN A1C: HEMOGLOBIN A1C: 7.9 % — AB (ref 4.6–6.5)

## 2015-11-04 LAB — TESTOSTERONE: Testosterone: 377.74 ng/dL (ref 300.00–890.00)

## 2015-11-05 ENCOUNTER — Ambulatory Visit: Payer: Medicare Other | Admitting: Endocrinology

## 2015-11-08 ENCOUNTER — Other Ambulatory Visit: Payer: Self-pay | Admitting: *Deleted

## 2015-11-08 MED ORDER — AMLODIPINE BESYLATE 5 MG PO TABS: ORAL_TABLET | ORAL | 1 refills | Status: DC

## 2015-11-08 MED ORDER — ATORVASTATIN CALCIUM 20 MG PO TABS: ORAL_TABLET | ORAL | 1 refills | Status: DC

## 2015-11-24 ENCOUNTER — Other Ambulatory Visit: Payer: Self-pay | Admitting: *Deleted

## 2015-11-24 MED ORDER — PAROXETINE HCL 40 MG PO TABS: 40.0000 mg | ORAL_TABLET | Freq: Every day | ORAL | 5 refills | Status: DC

## 2015-11-25 ENCOUNTER — Ambulatory Visit (INDEPENDENT_AMBULATORY_CARE_PROVIDER_SITE_OTHER): Payer: Medicare Other | Admitting: Endocrinology

## 2015-11-25 ENCOUNTER — Encounter: Payer: Self-pay | Admitting: Endocrinology

## 2015-11-25 VITALS — BP 154/71 | HR 68 | Temp 97.7°F | Resp 16 | Ht 67.0 in | Wt 264.8 lb

## 2015-11-25 DIAGNOSIS — Z23 Encounter for immunization: Secondary | ICD-10-CM | POA: Diagnosis not present

## 2015-11-25 DIAGNOSIS — E1065 Type 1 diabetes mellitus with hyperglycemia: Secondary | ICD-10-CM

## 2015-11-25 MED ORDER — VALSARTAN 80 MG PO TABS: 80.0000 mg | ORAL_TABLET | Freq: Every day | ORAL | 3 refills | Status: DC

## 2015-11-25 NOTE — Progress Notes (Signed)
Patient ID: Paul Adkins, male   DOB: [birth year], 70 y.o.   MRN: [id number]   Reason for visit: Diabetes and hypertension follow-up  Diagnosis: Type 1 diabetes, date of onset 1978  PAST history: He has had persistently poorly controlled diabetes for several years. A1c has been high with usual range 8.2 -9, overall improved since 2013. Compliance with glucose monitoring and diet has been variable and he does better when he is checking more sugars. His A1c was better than usual in 3/14 at 7.5 but he was also having significant hypoglycemia overnight and also occasionally later in the day. At that time he was started on Invokana 300 mg daily which helped him with glucose control and mild weight loss  CURRENT insulin pump brand:  Medtronic   PUMP SETTINGS are:   Basal rates: 2.25 from midnight- 4 AM. 4 AM = 2.0.  8:30 pM = 2.6.  Boluses : 1 unit for 6 g carbs at mealtimes and blood sugar target 110-120, sensitivity 1:30overnight, 1:25 until 8 PM and then 1:50 with active insulin 3 hours.  Changing infusion set every 3 days , has been reusing his set as his insurance will not pay for changing every 2 days Average total daily insulin = 87 units  RECENT history:    With restarting his Invokana his A1c had improved to 7.9 in 8/16  However he stopped his Invokana 3 weeks ago because he was told that his kidney function was not good  Current blood sugar patterns and problems:  He has checked his blood sugars very sporadically now  Blood sugars are recently poorly controlled  He thinks that this is partly from eating out a lot until recently  He checks his blood sugars mostly in the morning and these are consistently high except on a couple of days  Also has very few readings in the evenings  He is not motivated to exercise either  Weight has gone up a little bit    Changing infusion set every 3-4 days  GLUCOSE readings: Reviewed for the last 2 weeks, on an average checking  on average less than 2 times a day  Mean values apply above for all meters except median for One Touch  PRE-MEAL Fasting Lunch Dinner Bedtime Overall  Glucose range: 96-335 94, 176 209-292 184-327   Mean/median:     199     Physical activity: none recently   Wt Readings from Last 3 Encounters:  11/25/15 264 lb 12.8 oz (120.1 kg)  10/26/15 261 lb (118.4 kg)  10/20/15 260 lb (117.9 kg)    Lab Results  Component Value Date   HGBA1C 7.9 (H) 11/03/2015   HGBA1C 8.1 (H) 06/09/2015   HGBA1C 8.2 (H) 04/29/2015   Lab Results  Component Value Date   MICROALBUR <0.7 08/02/2015   [place] 78 10/05/2014   CREATININE 1.35 11/03/2015    OTHER problems addressed today are in review of systems   No visits with results within 1 Week(s) from this visit.  Latest known visit with results is:  Lab on 11/03/2015  Component Date Value Ref Range Status  . Hgb A1c MFr Bld 11/03/2015 7.9* 4.6 - 6.5 % Final  . Sodium 11/03/2015 138  135 - 145 mEq/L Final  . Potassium 11/03/2015 4.0  3.5 - 5.1 mEq/L Final  . Chloride 11/03/2015 105  96 - 112 mEq/L Final  . CO2 11/03/2015 29  19 - 32 mEq/L Final  . Glucose, Bld 11/03/2015 109* 70 - 99  mg/dL Final  . BUN 11/03/2015 27* 6 - 23 mg/dL Final  . Creatinine, Ser 11/03/2015 1.35  0.40 - 1.50 mg/dL Final  . Total Bilirubin 11/03/2015 0.4  0.2 - 1.2 mg/dL Final  . Alkaline Phosphatase 11/03/2015 63  39 - 117 U/L Final  . AST 11/03/2015 15  0 - 37 U/L Final  . ALT 11/03/2015 16  0 - 53 U/L Final  . Total Protein 11/03/2015 6.6  6.0 - 8.3 g/dL Final  . Albumin 11/03/2015 4.0  3.5 - 5.2 g/dL Final  . Calcium 11/03/2015 8.8  8.4 - 10.5 mg/dL Final  . GFR 11/03/2015 55.88* >60.00 mL/min Final  . Testosterone 11/04/2015 377.74  300.00 - 890.00 ng/dL Final      Medication List       Accurate as of 11/25/15  2:03 PM. Always use your most recent med list.          amLODipine 5 MG tablet Commonly known as:  NORVASC TAKE 1 TABLET (5 MG TOTAL) BY  MOUTH DAILY.   aspirin 81 MG tablet Take 81 mg by mouth daily.   atorvastatin 20 MG tablet Commonly known as:  LIPITOR TAKE 1 TABLET (20 MG TOTAL) BY MOUTH DAILY.   glucose blood test strip Commonly known as:  BAYER CONTOUR NEXT TEST Use as instructed to check blood sugars 4 times per day dx code E10.65   HUMALOG 100 UNIT/ML injection Generic drug:  insulin lispro USE MAX 110 UNITS PER DAY WITH INSULIN PUMP   INVOKANA 300 MG Tabs tablet Generic drug:  canagliflozin TAKE 1 TABLET BY MOUTH EVERY DAY   methylphenidate 10 MG CR capsule Commonly known as:  METADATE CD Take 1 capsule (10 mg total) by mouth every morning.   PARoxetine 40 MG tablet Commonly known as:  PAXIL Take 1 tablet (40 mg total) by mouth at bedtime.   testosterone 50 MG/5GM (1%) Gel Commonly known as:  ANDROGEL Place 7.5 g onto the skin daily. Use 1 1/2 tubes daily as directed   valsartan 80 MG tablet Commonly known as:  DIOVAN Take 1 tablet (80 mg total) by mouth daily.       Allergies:  Allergies  Allergen Reactions  . Viagra [Sildenafil Citrate]     Past Medical History:  Diagnosis Date  . Carpal tunnel syndrome   . Depression   . Diabetes mellitus without complication ([place])   . ED (erectile dysfunction)   . Edema   . Hyperlipidemia   . Hypertension   . Lumbar disc disease   . Neuropathy ([place])   . Stroke [facility])     Past Surgical History:  Procedure Laterality Date  . BACK SURGERY    . VASECTOMY      Family History  Problem Relation Age of Onset  . Cancer Father     Prostate  . Diabetes Neg Hx     Social History:  reports that he has never smoked. He has never used smokeless tobacco. He reports that he does not drink alcohol or use drugs.  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   Currently on AndroGel 5 g packets and the dose was increased to 1-1/2 packets on the last visit because of relatively low level He is is compliant with this Not complaining of any unusual fatigue   Lab Results  Component Value Date   TESTOSTERONE 377.74 11/03/2015     HYPERTENSION: Currently taking lisinopril HCT with his amlodipine No  Edema recently  Also his creatinine tends to be upper normal without evidence of proteinuria This appears to be relatively better recently   Lab Results  Component Value Date   CREATININE 1.35 11/03/2015   BUN 27 (H) 11/03/2015   NA 138 11/03/2015   K 4.0 11/03/2015   CL 105 11/03/2015   CO2 29 11/03/2015    Hypercholesterolemia: Well controlled on Lipitor 20 mg, tends to have low HDL  Lab Results  Component Value Date   CHOL 134 08/02/2015   HDL 28.80 (L) 08/02/2015   LDLCALC 78 10/05/2014   LDLDIRECT 67.0 08/02/2015   TRIG 308.0 (H) 08/02/2015   CHOLHDL 5 08/02/2015    EXAM:   BP (!) 154/71   Pulse 68   Temp 97.7 F (36.5 C)   Resp 16   Ht 5\' 7"  (1.702 m)   Wt 264 lb 12.8 oz (120.1 kg)   SpO2 97%   BMI 41.47 kg/m   Repeat blood pressure was 145/68  No ankle edema  ASSESSMENT:  DIABETES and obesity:  See history of present illness for detailed discussion on current blood sugar patterns and problems identified His A1c has been over 8% consistently Blood sugars are poorly controlled more recently, possibly from not taking Invokana Also he has been poorly compliant with diet as well as checking his  sugars Not exercising also  Discussed that he has had some complications from diabetes and recent sixth at processes are related to his diabetes Does need to do better with his motivation and compliance, not able to come around even with seeing a psychologist.  For now he will start back on 150 mg of Invokana He will increase his overnight basal rate for now with basal of 2.4 at midnight and 2.2 at 4 AM Also may need to change his sensitivity after 8 PM More consistent monitoring is important and this was stressed. Encouraged him to start exercise  HYPERTENSION: not not quite as well controlled Will switch him to Diovan 80 mg while starting Invokana 150 mg to hopefully prevent any increase in creatinine   Hypogonadism: His level is better with 7.5 g of AndroGel He will continue to be regular with this  Influenza vaccine given   Patient Instructions  Restart exercise  Low fat diet   Check sugars 4x daily  Invokana 1/2 in ams  Stop Lisinopril   Counseling time on subjects discussed above is over 50% of today's 25 minute visit     [facility] 11/25/2015

## 2015-11-25 NOTE — Patient Instructions (Addendum)
Restart exercise  Low fat diet   Check sugars 4x daily  Invokana 1/2 in ams  Stop Lisinopril

## 2015-12-06 ENCOUNTER — Ambulatory Visit (INDEPENDENT_AMBULATORY_CARE_PROVIDER_SITE_OTHER): Payer: Medicare Other | Admitting: Ophthalmology

## 2015-12-06 DIAGNOSIS — E103391 Type 1 diabetes mellitus with moderate nonproliferative diabetic retinopathy without macular edema, right eye: Secondary | ICD-10-CM

## 2015-12-06 DIAGNOSIS — H35033 Hypertensive retinopathy, bilateral: Secondary | ICD-10-CM | POA: Diagnosis not present

## 2015-12-06 DIAGNOSIS — E103592 Type 1 diabetes mellitus with proliferative diabetic retinopathy without macular edema, left eye: Secondary | ICD-10-CM

## 2015-12-06 DIAGNOSIS — D3132 Benign neoplasm of left choroid: Secondary | ICD-10-CM | POA: Diagnosis not present

## 2015-12-06 DIAGNOSIS — E10319 Type 1 diabetes mellitus with unspecified diabetic retinopathy without macular edema: Secondary | ICD-10-CM

## 2015-12-06 DIAGNOSIS — H43813 Vitreous degeneration, bilateral: Secondary | ICD-10-CM

## 2015-12-06 DIAGNOSIS — I10 Essential (primary) hypertension: Secondary | ICD-10-CM

## 2015-12-21 ENCOUNTER — Other Ambulatory Visit: Payer: Self-pay | Admitting: Endocrinology

## 2015-12-22 ENCOUNTER — Other Ambulatory Visit (INDEPENDENT_AMBULATORY_CARE_PROVIDER_SITE_OTHER): Payer: Medicare Other

## 2015-12-22 DIAGNOSIS — E1065 Type 1 diabetes mellitus with hyperglycemia: Secondary | ICD-10-CM

## 2015-12-22 LAB — BASIC METABOLIC PANEL
BUN: 29 mg/dL — AB (ref 6–23)
CALCIUM: 9 mg/dL (ref 8.4–10.5)
CO2: 24 mEq/L (ref 19–32)
Chloride: 107 mEq/L (ref 96–112)
Creatinine, Ser: 1.37 mg/dL (ref 0.40–1.50)
GFR: 54.92 mL/min — AB (ref >60.00)
GLUCOSE: 190 mg/dL — AB (ref 70–99)
Potassium: 4.1 mEq/L (ref 3.5–5.1)
SODIUM: 138 meq/L (ref 135–145)

## 2015-12-27 ENCOUNTER — Encounter: Payer: Self-pay | Admitting: Endocrinology

## 2015-12-27 ENCOUNTER — Ambulatory Visit (INDEPENDENT_AMBULATORY_CARE_PROVIDER_SITE_OTHER): Payer: Medicare Other | Admitting: Endocrinology

## 2015-12-27 VITALS — BP 150/80 | HR 71 | Ht 67.0 in | Wt 264.0 lb

## 2015-12-27 DIAGNOSIS — E1065 Type 1 diabetes mellitus with hyperglycemia: Secondary | ICD-10-CM | POA: Diagnosis not present

## 2015-12-27 DIAGNOSIS — I10 Essential (primary) hypertension: Secondary | ICD-10-CM

## 2015-12-27 MED ORDER — VALSARTAN 160 MG PO TABS: 160.0000 mg | ORAL_TABLET | Freq: Every day | ORAL | 3 refills | Status: DC

## 2015-12-27 NOTE — Progress Notes (Signed)
Patient ID: Paul Adkins, male   DOB: [birth year], 70 y.o.   MRN: [id number]   Reason for visit: Diabetes and hypertension follow-up  Diagnosis: Type 1 diabetes, date of onset 1978  PAST history: He has had persistently poorly controlled diabetes for several years. A1c has been high with usual range 8.2 -9, overall improved since 2013. Compliance with glucose monitoring and diet has been variable and he does better when he is checking more sugars. His A1c was better than usual in 3/14 at 7.5 but he was also having significant hypoglycemia overnight and also occasionally later in the day. At that time he was started on Invokana 300 mg daily which helped him with glucose control and mild weight loss  CURRENT insulin pump brand:  Medtronic   PUMP SETTINGS are:   Basal rates: 2.25 from midnight- 4 AM. 4 AM = 2.0.  8:30 pM = 2.6.  Boluses : 1 unit for 6 g carbs at mealtimes and blood sugar target 110-120, sensitivity 1:30overnight, 1:25 until 8 PM and then 1:50 with active insulin 3 hours.  Changing infusion set every 3 days , has been reusing his set as his insurance will not pay for changing every 2 days Average total daily insulin = 87 units  RECENT history:    With restarting his Invokana his A1c had improved to 7.9 in 8/16  However even with restarting Invokana not clear if his sugars are better  Current blood sugar patterns and problems:  He has checked his blood sugars infrequently and mostly in the mornings now  Blood sugars are quite variable in the mornings with no consistent pattern  He is only doing sporadic readings in the evenings when he is eating several snacks in addition to his evening meal  Blood sugars at lunchtime are relatively better  Yesterday has blood sugar was over 400, this was probably from eating a hamburger at a fast food restaurant   He has not been motivated to exercise also   Changing infusion set every 3-4 days  GLUCOSE readings: Reviewed  for the last 2 weeks, on an average checking on average less than 2 times a day  Mean values apply above for all meters except median for One Touch  PRE-MEAL Fasting Lunch Dinner Bedtime Overall  Glucose range: 92-305  102-187   400+    Mean/median: 174     177    Physical activity: none    Wt Readings from Last 3 Encounters:  12/27/15 264 lb (119.7 kg)  11/25/15 264 lb 12.8 oz (120.1 kg)  10/26/15 261 lb (118.4 kg)    Lab Results  Component Value Date   HGBA1C 7.9 (H) 11/03/2015   HGBA1C 8.1 (H) 06/09/2015   HGBA1C 8.2 (H) 04/29/2015   Lab Results  Component Value Date   MICROALBUR <0.7 08/02/2015   LDLCALC 78 10/05/2014   CREATININE 1.37 12/22/2015    OTHER problems addressed today are in review of systems   Lab on 12/22/2015  Component Date Value Ref Range Status  . Sodium 12/22/2015 138  135 - 145 mEq/L Final  . Potassium 12/22/2015 4.1  3.5 - 5.1 mEq/L Final  . Chloride 12/22/2015 107  96 - 112 mEq/L Final  . CO2 12/22/2015 24  19 - 32 mEq/L Final  . Glucose, Bld 12/22/2015 190* 70 - 99 mg/dL Final  . BUN 12/22/2015 29* 6 - 23 mg/dL Final  . Creatinine, Ser 12/22/2015 1.37  0.40 - 1.50 mg/dL Final  . Calcium  12/22/2015 9.0  8.4 - 10.5 mg/dL Final  . GFR 12/22/2015 54.92* >60.00 mL/min Final      Medication List       Accurate as of 12/27/15  9:37 AM. Always use your most recent med list.          amLODipine 5 MG tablet Commonly known as:  NORVASC TAKE 1 TABLET (5 MG TOTAL) BY MOUTH DAILY.   aspirin 81 MG tablet Take 81 mg by mouth daily.   atorvastatin 20 MG tablet Commonly known as:  LIPITOR TAKE 1 TABLET (20 MG TOTAL) BY MOUTH DAILY.   glucose blood test strip Commonly known as:  BAYER CONTOUR NEXT TEST Use as instructed to check blood sugars 4 times per day dx code E10.65   HUMALOG 100 UNIT/ML injection Generic drug:  insulin lispro USE MAX 110 UNITS PER DAY WITH INSULIN PUMP   INVOKANA 300 MG Tabs tablet Generic drug:   canagliflozin TAKE 1 TABLET BY MOUTH EVERY DAY   methylphenidate 10 MG CR capsule Commonly known as:  METADATE CD Take 1 capsule (10 mg total) by mouth every morning.   PARoxetine 40 MG tablet Commonly known as:  PAXIL Take 1 tablet (40 mg total) by mouth at bedtime.   testosterone 50 MG/5GM (1%) Gel Commonly known as:  ANDROGEL Place 7.5 g onto the skin daily. Use 1 1/2 tubes daily as directed   valsartan 160 MG tablet Commonly known as:  DIOVAN Take 1 tablet (160 mg total) by mouth daily.       Allergies:  Allergies  Allergen Reactions  . Viagra [Sildenafil Citrate]     Past Medical History:  Diagnosis Date  . Carpal tunnel syndrome   . Depression   . Diabetes mellitus without complication ([place])   . ED (erectile dysfunction)   . Edema   . Hyperlipidemia   . Hypertension   . Lumbar disc disease   . Neuropathy ([place])   . Stroke [facility])     Past Surgical History:  Procedure Laterality Date  . BACK SURGERY    . VASECTOMY      Family History  Problem Relation Age of Onset  . Cancer Father     Prostate  . Diabetes Neg Hx     Social History:  reports that he has never smoked. He has never used smokeless tobacco. He reports that he does not drink alcohol or use drugs.  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   Currently on AndroGel 5 g  packets and the dose was increased to 1-1/2 packets previously He is is compliant with this Not complaining of any unusual fatigue   Lab Results  Component Value Date   TESTOSTERONE 377.74 11/03/2015     HYPERTENSION: Currently taking Valsartan 80 mg along with Invokana and 5 mg amlodipine He thinks he has had some edema recently  Also his creatinine tends to  be upper normal without evidence of proteinuria This has been stable   Lab Results  Component Value Date   CREATININE 1.37 12/22/2015   BUN 29 (H) 12/22/2015   NA 138 12/22/2015   K 4.1 12/22/2015   CL 107 12/22/2015   CO2 24 12/22/2015    Hypercholesterolemia: Well controlled on Lipitor 20 mg, tends to have low HDL And high triglycerides  Lab Results  Component Value Date   CHOL 134 08/02/2015   HDL 28.80 (L) 08/02/2015   LDLCALC 78 10/05/2014   LDLDIRECT 67.0 08/02/2015   TRIG 308.0 (H) 08/02/2015   CHOLHDL 5 08/02/2015    EXAM:   BP (!) 150/80   Pulse 71   Ht 5\' 7"  (1.702 m)   Wt 264 lb (119.7 kg)   SpO2 96%   BMI 41.35 kg/m   Repeat blood pressure was Unchanged with large cuff  1+ ankle edema present  ASSESSMENT:  DIABETES and obesity:  See history of present illness for detailed discussion on current blood sugar patterns and problems identified His A1c has been over 8% consistently Blood sugars are still overall poorly controlled  Not clear if he is benefiting from [place] as he had Also he has been poorly compliant with diet as well as checking his sugars, recently also not consistent with diet with higher fat meals and carbohydrate especially in the evening Not exercising also because of lack of motivation  Discussed that he has had some complications from diabetes and recent sixth at processes are related to his diabetes Does need to do better with his motivation and compliance, not able to come around even with seeing a psychologist. No change in insulin regimen currently  More  consistent monitoring is needed for better control and this was stressed. Encouraged him to start exercise on his exercise bike A1c to be checked on the next visit  HYPERTENSION:Still not  as well controlled despite starting Invokana Will switch him to Diovan 160 mg     Patient Instructions  Check 4x daily  Exercise bike  Valsartan 80mg      Counseling time on subjects discussed above is over 50% of today's 25 minute visit     [facility] 12/27/2015

## 2015-12-27 NOTE — Patient Instructions (Signed)
Check 4x daily  Exercise bike  Valsartan 80mg 

## 2016-01-18 ENCOUNTER — Other Ambulatory Visit: Payer: Self-pay | Admitting: Endocrinology

## 2016-01-18 NOTE — Telephone Encounter (Signed)
Rx request to pharmacy/[facility]  

## 2016-01-31 NOTE — Patient Instructions (Signed)
Return in 2 weeks for download of sensor. Complete food record and bring it with you. If sensor falls off, bring it back.

## 2016-02-16 ENCOUNTER — Ambulatory Visit: Payer: Medicare Other | Admitting: Endocrinology

## 2016-02-25 ENCOUNTER — Other Ambulatory Visit (INDEPENDENT_AMBULATORY_CARE_PROVIDER_SITE_OTHER): Payer: Medicare Other

## 2016-02-25 DIAGNOSIS — E1065 Type 1 diabetes mellitus with hyperglycemia: Secondary | ICD-10-CM | POA: Diagnosis not present

## 2016-02-25 LAB — LIPID PANEL
Cholesterol: 115 mg/dL (ref 0–200)
HDL: 37.7 mg/dL — ABNORMAL LOW (ref >39.00)
LDL Cholesterol: 58 mg/dL (ref 0–99)
NonHDL: 77.39
Total CHOL/HDL Ratio: 3
Triglycerides: 96 mg/dL (ref 0.0–149.0)
VLDL: 19.2 mg/dL (ref 0.0–40.0)

## 2016-02-25 LAB — COMPREHENSIVE METABOLIC PANEL
ALT: 20 U/L (ref 0–53)
AST: 16 U/L (ref 0–37)
Albumin: 3.6 g/dL (ref 3.5–5.2)
Alkaline Phosphatase: 89 U/L (ref 39–117)
BUN: 23 mg/dL (ref 6–23)
CHLORIDE: 104 meq/L (ref 96–112)
CO2: 25 meq/L (ref 19–32)
Calcium: 8.5 mg/dL (ref 8.4–10.5)
Creatinine, Ser: 1.3 mg/dL (ref 0.40–1.50)
GFR: 58.31 mL/min — AB (ref >60.00)
GLUCOSE: 130 mg/dL — AB (ref 70–99)
POTASSIUM: 4 meq/L (ref 3.5–5.1)
SODIUM: 137 meq/L (ref 135–145)
Total Bilirubin: 0.5 mg/dL (ref 0.2–1.2)
Total Protein: 6.1 g/dL (ref 6.0–8.3)

## 2016-02-25 LAB — HEMOGLOBIN A1C: HEMOGLOBIN A1C: 8.4 % — AB (ref 4.6–6.5)

## 2016-02-29 ENCOUNTER — Encounter: Payer: Self-pay | Admitting: Endocrinology

## 2016-02-29 ENCOUNTER — Ambulatory Visit (INDEPENDENT_AMBULATORY_CARE_PROVIDER_SITE_OTHER): Payer: Medicare Other | Admitting: Endocrinology

## 2016-02-29 VITALS — BP 151/70 | HR 70 | Ht 67.52 in | Wt 264.0 lb

## 2016-02-29 DIAGNOSIS — J209 Acute bronchitis, unspecified: Secondary | ICD-10-CM | POA: Diagnosis not present

## 2016-02-29 DIAGNOSIS — E1165 Type 2 diabetes mellitus with hyperglycemia: Secondary | ICD-10-CM | POA: Diagnosis not present

## 2016-02-29 DIAGNOSIS — I10 Essential (primary) hypertension: Secondary | ICD-10-CM

## 2016-02-29 DIAGNOSIS — Z794 Long term (current) use of insulin: Secondary | ICD-10-CM

## 2016-02-29 MED ORDER — AZITHROMYCIN 250 MG PO TABS: ORAL_TABLET | ORAL | 0 refills | Status: DC

## 2016-02-29 NOTE — Progress Notes (Signed)
Patient ID: Paul Adkins, male   DOB: January 15, 1946, 71 y.o.   MRN: [id number]   Reason for visit: Diabetes and hypertension follow-up  Diagnosis: Type 1 diabetes, date of onset 1978  PAST history: He has had persistently poorly controlled diabetes for several years. A1c has been high with usual range 8.2 -9, overall improved since 2013. Compliance with glucose monitoring and diet has been variable and he does better when he is checking more sugars. His A1c was better than usual in 3/14 at 7.5 but he was also having significant hypoglycemia overnight and also occasionally later in the day. At that time he was started on Invokana 300 mg daily which helped him with glucose control and mild weight loss  CURRENT insulin pump brand:  Medtronic   PUMP SETTINGS are:   Basal rates: 2.25 from midnight- 4 AM. 4 AM = 2.0.  8:30 pM = 2.6.  Boluses : 1 unit for 6 g carbs at mealtimes and blood sugar target 110-120, sensitivity 1:30overnight, 1:25 until 8 PM and then 1:50 with active insulin 3 hours.  Changing infusion set every 3 days , has been reusing his set as his insurance will not pay for changing every 2 days Average total daily insulin = 87 units  RECENT history:    With restarting his Invokana his A1c had improved to 7.9 in 8/16  However even with restarting Invokana not clear if his sugars are better  Current blood sugar patterns and problems:  He has checked his blood sugars infrequently and mostly in the mornings, he does not feel motivated to check more often  Blood sugars are quite variable in the mornings but overall not high, not clear what causes variability  He is frequently bolusing and meals without checking his blood sugar   He thinks that the last 7-10 days he has tried to cut back on his eating and snacking and also may not be eating as much because of his cold symptoms, occasionally has skipped breakfast  Still occasionally eating snacks late at night or small  amount of carbohydrate during the night  He has not been motivated to exercise also   Changing infusion set every 3-4 days  GLUCOSE readings: Reviewed for the last 2 weeks, on an average checking on average less than 2 times a day  Mean values apply above for all meters except median for One Touch  PRE-MEAL Fasting Lunch Dinner Overnight  Overall  Glucose range: 82-337  95, 139  122  65, 73    Mean/median: 167     148+/-57    POST-MEAL PC Breakfast PC Lunch PC Dinner  Glucose range:   97-221   Mean/median:       Physical activity: none    Wt Readings from Last 3 Encounters:  02/29/16 264 lb (119.7 kg)  12/27/15 264 lb (119.7 kg)  11/25/15 264 lb 12.8 oz (120.1 kg)    Lab Results  Component Value Date   HGBA1C 8.4 (H) 02/25/2016   HGBA1C 7.9 (H) 11/03/2015   HGBA1C 8.1 (H) 06/09/2015   Lab Results  Component Value Date   MICROALBUR <0.7 08/02/2015   LDLCALC 58 02/25/2016   CREATININE 1.30 02/25/2016    OTHER problems addressed today are in review of systems   Lab on 02/25/2016  Component Date Value Ref Range Status  . Hgb A1c MFr Bld 02/25/2016 8.4* 4.6 - 6.5 % Final  . Sodium 02/25/2016 137  135 - 145 mEq/L Final  . Potassium  02/25/2016 4.0  3.5 - 5.1 mEq/L Final  . Chloride 02/25/2016 104  96 - 112 mEq/L Final  . CO2 02/25/2016 25  19 - 32 mEq/L Final  . Glucose, Bld 02/25/2016 130* 70 - 99 mg/dL Final  . BUN 02/25/2016 23  6 - 23 mg/dL Final  . Creatinine, Ser 02/25/2016 1.30  0.40 - 1.50 mg/dL Final  . Total Bilirubin 02/25/2016 0.5  0.2 - 1.2 mg/dL Final  . Alkaline Phosphatase 02/25/2016 89  39 - 117 U/L Final  . AST 02/25/2016 16  0 - 37 U/L Final  . ALT 02/25/2016 20  0 - 53 U/L Final  . Total Protein 02/25/2016 6.1  6.0 - 8.3 g/dL Final  . Albumin 02/25/2016 3.6  3.5 - 5.2 g/dL Final  . Calcium 02/25/2016 8.5  8.4 - 10.5 mg/dL Final  . GFR 02/25/2016 58.31* >60.00 mL/min Final  . Cholesterol 02/25/2016 115  0 - 200 mg/dL Final  . Triglycerides  02/25/2016 96.0  0.0 - 149.0 mg/dL Final  . HDL 02/25/2016 37.70* >39.00 mg/dL Final  . VLDL 02/25/2016 19.2  0.0 - 40.0 mg/dL Final  . LDL Cholesterol 02/25/2016 58  0 - 99 mg/dL Final  . Total CHOL/HDL Ratio 02/25/2016 3   Final  . NonHDL 02/25/2016 77.39   Final    Allergies as of 02/29/2016      Reactions   Viagra [sildenafil Citrate]       Medication List       Accurate as of 02/29/16  3:10 PM. Always use your most recent med list.          amLODipine 5 MG tablet Commonly known as:  NORVASC TAKE 1 TABLET (5 MG TOTAL) BY MOUTH DAILY.   aspirin 81 MG tablet Take 81 mg by mouth daily.   atorvastatin 20 MG tablet Commonly known as:  LIPITOR TAKE 1 TABLET (20 MG TOTAL) BY MOUTH DAILY.   glucose blood test strip Commonly known as:  BAYER CONTOUR NEXT TEST Use as instructed to check blood sugars 4 times per day dx code E10.65   HUMALOG 100 UNIT/ML injection Generic drug:  insulin lispro USE MAX 110 UNITS PER DAY WITH INSULIN PUMP   INVOKANA 300 MG Tabs tablet Generic drug:  canagliflozin TAKE 1 TABLET BY MOUTH EVERY DAY   lisinopril-hydrochlorothiazide 20-12.5 MG tablet Commonly known as:  PRINZIDE,ZESTORETIC TAKE 1 TABLET BY MOUTH DAILY.   methylphenidate 10 MG CR capsule Commonly known as:  METADATE CD Take 1 capsule (10 mg total) by mouth every morning.   PARoxetine 40 MG tablet Commonly known as:  PAXIL Take 1 tablet (40 mg total) by mouth at bedtime.   testosterone 50 MG/5GM (1%) Gel Commonly known as:  ANDROGEL Place 7.5 g onto the skin daily. Use 1 1/2 tubes daily as directed   valsartan 160 MG tablet Commonly known as:  DIOVAN Take 1 tablet (160 mg total) by mouth daily.       Allergies:  Allergies  Allergen Reactions  . Viagra [Sildenafil Citrate]     Past Medical History:  Diagnosis Date  . Carpal tunnel syndrome   . Depression   . Diabetes mellitus without complication ([place])   . ED (erectile dysfunction)   . Edema   . Hyperlipidemia    . Hypertension   . Lumbar disc disease   . Neuropathy ([place])   . Stroke [facility])     Past Surgical History:  Procedure Laterality Date  . BACK SURGERY    .  VASECTOMY      Family History  Problem Relation Age of Onset  . Cancer Father     Prostate  . Diabetes Neg Hx     Social History:  reports that he has never smoked. He has never used smokeless tobacco. He reports that he does not drink alcohol or use drugs.  REVIEW of systems:  He is followed by his retina specialist   His diplopia is much better, has only mild difficulty with vision but no double vision none  COUGH: He has had some cough with now grayish yellow sputum, no fever. 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   Currently on AndroGel 5 g packets and the dose was increased to 1-1/2 packets previously He is is compliant with this    Lab Results  Component Value Date   TESTOSTERONE 377.74 11/03/2015     HYPERTENSION: Currently taking Valsartan 160 mg along with Invokana and 5 mg amlodipine He has taken a decongestant and blood pressure is relatively higher   His creatinine tends to be upper normal without evidence of proteinuria This has been stable   Lab Results  Component Value Date   CREATININE 1.30 02/25/2016   BUN 23 02/25/2016   NA 137 02/25/2016   K 4.0 02/25/2016   CL 104 02/25/2016   CO2 25 02/25/2016    Hypercholesterolemia: Well controlled on  Lipitor 20 mg, tends to have low HDL And high triglycerides  Lab Results  Component Value Date   CHOL 115 02/25/2016   HDL 37.70 (L) 02/25/2016   LDLCALC 58 02/25/2016   LDLDIRECT 67.0 08/02/2015   TRIG 96.0 02/25/2016   CHOLHDL 3 02/25/2016    EXAM:   BP (!) 151/70   Pulse 70   Ht 5' 7.52" (1.715 m)   Wt 264 lb (119.7 kg)   SpO2 97%   BMI 40.71 kg/m   Pharynx appears normal. Left tympanic membrane not clearly visible but does not appear erythematous Right tympanic membrane normal No edema   ASSESSMENT/PLAN:  Upper respiratory infection: He has persistent symptoms with relatively more purulent sputum recently Also has persistent nasal and ear congestion, currently only on Mucinex and oral decongestant  He will be given Zithromax for 5 days Also he can stop decongestant as it is not helping and maybe raising blood pressure He can start using OTC allergy tablet without decongestant along with decongestant drops into the right nasal passage in supine position to help drain the right ear  DIABETES and obesity:  See history of present illness for detailed discussion on current blood sugar patterns and problems identified His A1c has been over 8% consistently Blood sugars are still overall poorly controlled  However he has not monitored his blood sugar much except some in the morning As usual his diet is variable and probably causing most of his hyperglycemia More recently he thinks his diet is better and is having somewhat better blood sugars including low  normal readings in the night which is unusual  Recommendations:  For now reduce basal rate at midnight down to 2.1  More consistent glucose monitoring.  Discussed use of the FreeStyle Libre  Bolus consistently for all meals and use carbohydrate counting consistently  Continue Invokana  Start using exercise bike when he feels better.  HYPERTENSION: Blood pressure is relatively higher and may be related to  taking a decongestant     Patient Instructions  Exercise daily  Neosynephine nose drops 2x daily  Basal at MN to 2.10  More testing   Counseling time on subjects discussed above is over 50% of today's 25 minute visit     [facility] 02/29/2016

## 2016-02-29 NOTE — Patient Instructions (Addendum)
Exercise daily  Neosynephine nose drops 2x daily  Basal at MN to 2.10  More testing  Take allergy pill like Allegra

## 2016-03-03 ENCOUNTER — Other Ambulatory Visit: Payer: Self-pay | Admitting: Endocrinology

## 2016-03-03 ENCOUNTER — Telehealth: Payer: Self-pay | Admitting: Endocrinology

## 2016-03-03 DIAGNOSIS — H9190 Unspecified hearing loss, unspecified ear: Secondary | ICD-10-CM

## 2016-03-03 DIAGNOSIS — R040 Epistaxis: Secondary | ICD-10-CM

## 2016-03-03 DIAGNOSIS — H6501 Acute serous otitis media, right ear: Secondary | ICD-10-CM

## 2016-03-03 NOTE — Telephone Encounter (Signed)
Pt is not seeing any improvement, he still cannot hear, nose is still having blood when he blows, nothing better at all

## 2016-03-03 NOTE — Telephone Encounter (Signed)
Urgent ENT consultation done, if not able to get this today needs to go to the urgent care center

## 2016-03-03 NOTE — Telephone Encounter (Signed)
Pt is aware and has an appt with Dr. Lucia Gaskins 03/09/16 pt is aware that if needed he can head to Urgent care

## 2016-03-27 ENCOUNTER — Other Ambulatory Visit: Payer: Self-pay | Admitting: Endocrinology

## 2016-04-01 ENCOUNTER — Other Ambulatory Visit: Payer: Self-pay | Admitting: Endocrinology

## 2016-04-10 ENCOUNTER — Telehealth: Payer: Self-pay | Admitting: Endocrinology

## 2016-04-10 NOTE — Telephone Encounter (Signed)
Pharmacy stasted testosterone (ANDROGEL) 50 MG/5GM (1%) GEL  Send  To  [id number] [address], [place] [phone] (Phone) [phone] (Fax)

## 2016-04-10 NOTE — Telephone Encounter (Signed)
Patient is calling on the status of last message, please advise °

## 2016-04-11 ENCOUNTER — Ambulatory Visit (INDEPENDENT_AMBULATORY_CARE_PROVIDER_SITE_OTHER): Payer: Medicare Other | Admitting: Endocrinology

## 2016-04-11 ENCOUNTER — Encounter: Payer: Self-pay | Admitting: Endocrinology

## 2016-04-11 ENCOUNTER — Other Ambulatory Visit: Payer: Self-pay | Admitting: Endocrinology

## 2016-04-11 VITALS — BP 132/70 | HR 68 | Ht 68.0 in | Wt 271.0 lb

## 2016-04-11 DIAGNOSIS — E291 Testicular hypofunction: Secondary | ICD-10-CM | POA: Diagnosis not present

## 2016-04-11 DIAGNOSIS — E1065 Type 1 diabetes mellitus with hyperglycemia: Secondary | ICD-10-CM | POA: Diagnosis not present

## 2016-04-11 DIAGNOSIS — I10 Essential (primary) hypertension: Secondary | ICD-10-CM | POA: Diagnosis not present

## 2016-04-11 MED ORDER — TESTOSTERONE 50 MG/5GM (1%) TD GEL: TRANSDERMAL | 1 refills | Status: DC

## 2016-04-11 NOTE — Progress Notes (Signed)
Patient ID: Paul Adkins, male   DOB: April 08, 1945, 71 y.o.   MRN: [id number]    Reason for visit:   follow-up  Diagnosis: Type 1 diabetes, date of onset 1978  PAST history: He has had persistently poorly controlled diabetes for several years. A1c has been high with usual range 8.2 -9, overall improved since 2013. Compliance with glucose monitoring and diet has been variable and he does better when he is checking more sugars. His A1c was better than usual in 3/14 at 7.5 but he was also having significant hypoglycemia overnight and also occasionally later in the day. At that time he was started on Invokana 300 mg daily which helped him with glucose control and mild weight loss  CURRENT insulin pump brand:  Medtronic   PUMP SETTINGS are:   Basal rates: 2.25 from midnight- 4 AM. 4 AM = 2.0.  8:30 pM = 2.6.  Boluses : 1 unit for 6 g carbs at mealtimes and blood sugar target 110-120, sensitivity 1:30overnight, 1:25 until 8 PM and then 1:50 with active insulin 3 hours.  Changing infusion set every 3 days Average total daily insulin = 87 units  RECENT history:   His last A1c was higher at 8.4%, he is here for short-term follow-up  Current blood sugar patterns and problems:  He has checked his blood sugars much less frequently than before; again he does not have difficulty doing the finger prick but he does not get motivated to do this  He thinks his high fasting blood sugars recently are because of eating excessive snacks late at night and these may be sometimes high-fat.  Also during the day he may be getting high-fat meals using fast food  He has also not exercise and despite taking 300 mg Invokana has gained weight recently  Overall his carbohydrate intake on a daily basis is between 200 and 300 g and is usually taking multiple doses of boluses in the evenings especially   Changing infusion set every 3-4 days  GLUCOSE readings: Reviewed for the last 2 weeks from pump  download:  Mean values apply above for all meters except median for One Touch  PRE-MEAL Fasting Lunch Dinner Bedtime Overall  Glucose range: 127-258  161  203     Mean/median:        Physical activity: none    Wt Readings from Last 3 Encounters:  04/11/16 271 lb (122.9 kg)  02/29/16 264 lb (119.7 kg)  12/27/15 264 lb (119.7 kg)    Lab Results  Component Value Date   HGBA1C 8.4 (H) 02/25/2016   HGBA1C 7.9 (H) 11/03/2015   HGBA1C 8.1 (H) 06/09/2015   Lab Results  Component Value Date   MICROALBUR <0.7 08/02/2015   LDLCALC 58 02/25/2016   CREATININE 1.30 02/25/2016    OTHER problems addressed today are in review of systems   No visits with results within 1 Week(s) from this visit.  Latest known visit with results is:  Lab on 02/25/2016  Component Date Value Ref Range Status  . Hgb A1c MFr Bld 02/25/2016 8.4* 4.6 - 6.5 % Final  . Sodium 02/25/2016 137  135 - 145 mEq/L Final  . Potassium 02/25/2016 4.0  3.5 - 5.1 mEq/L Final  . Chloride 02/25/2016 104  96 - 112 mEq/L Final  . CO2 02/25/2016 25  19 - 32 mEq/L Final  . Glucose, Bld 02/25/2016 130* 70 - 99 mg/dL Final  . BUN 02/25/2016 23  6 - 23 mg/dL Final  .  Creatinine, Ser 02/25/2016 1.30  0.40 - 1.50 mg/dL Final  . Total Bilirubin 02/25/2016 0.5  0.2 - 1.2 mg/dL Final  . Alkaline Phosphatase 02/25/2016 89  39 - 117 U/L Final  . AST 02/25/2016 16  0 - 37 U/L Final  . ALT 02/25/2016 20  0 - 53 U/L Final  . Total Protein 02/25/2016 6.1  6.0 - 8.3 g/dL Final  . Albumin 02/25/2016 3.6  3.5 - 5.2 g/dL Final  . Calcium 02/25/2016 8.5  8.4 - 10.5 mg/dL Final  . GFR 02/25/2016 58.31* >60.00 mL/min Final  . Cholesterol 02/25/2016 115  0 - 200 mg/dL Final  . Triglycerides 02/25/2016 96.0  0.0 - 149.0 mg/dL Final  . HDL 02/25/2016 37.70* >39.00 mg/dL Final  . VLDL 02/25/2016 19.2  0.0 - 40.0 mg/dL Final  . LDL Cholesterol 02/25/2016 58  0 - 99 mg/dL Final  . Total CHOL/HDL Ratio 02/25/2016 3   Final  . NonHDL 02/25/2016  77.39   Final    Allergies as of 04/11/2016      Reactions   Viagra [sildenafil Citrate]       Medication List       Accurate as of 04/11/16  8:55 AM. Always use your most recent med list.          amLODipine 5 MG tablet Commonly known as:  NORVASC TAKE 1 TABLET (5 MG TOTAL) BY MOUTH DAILY.   aspirin 81 MG tablet Take 81 mg by mouth daily.   atorvastatin 20 MG tablet Commonly known as:  LIPITOR TAKE 1 TABLET (20 MG TOTAL) BY MOUTH DAILY.   azithromycin 250 MG tablet Commonly known as:  ZITHROMAX 2 tablets today then 1 tablet daily until finished   glucose blood test strip Commonly known as:  BAYER CONTOUR NEXT TEST Use as instructed to check blood sugars 4 times per day dx code E10.65   HUMALOG 100 UNIT/ML injection Generic drug:  insulin lispro USE MAX 110 UNITS PER DAY WITH INSULIN PUMP   INVOKANA 300 MG Tabs tablet Generic drug:  canagliflozin TAKE 1 TABLET BY MOUTH EVERY DAY   methylphenidate 10 MG CR capsule Commonly known as:  METADATE CD Take 1 capsule (10 mg total) by mouth every morning.   PARoxetine 40 MG tablet Commonly known as:  PAXIL Take 1 tablet (40 mg total) by mouth at bedtime.   testosterone 50 MG/5GM (1%) Gel Commonly known as:  ANDROGEL USE 1 AND 1/2 TUBES ONTO THE SKIN DAILY AS DIRECTED   valsartan 160 MG tablet Commonly known as:  DIOVAN Take 1 tablet (160 mg total) by mouth daily.       Allergies:  Allergies  Allergen Reactions  . Viagra [Sildenafil Citrate]     Past Medical History:  Diagnosis Date  . Carpal tunnel syndrome   . Depression   . Diabetes mellitus without complication ([place])   . ED (erectile dysfunction)   . Edema   . Hyperlipidemia   . Hypertension   . Lumbar disc disease   . Neuropathy ([place])   . Stroke [facility])     Past Surgical History:  Procedure Laterality Date  . BACK SURGERY    . VASECTOMY      Family History  Problem Relation Age of Onset  . Cancer Father     Prostate  . Diabetes Neg Hx      Social History:  reports that he has never smoked. He has never used smokeless tobacco. He reports that he does not drink  alcohol or use drugs.  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   Currently on AndroGel 5 g packets, Using 1-1/2 packets daily He is is compliant with this    Lab Results  Component Value Date   TESTOSTERONE 377.74 11/03/2015     HYPERTENSION: Currently taking Valsartan 160 mg along with Invokana and 5 mg amlodipine   His creatinine tends to be upper normal without evidence of proteinuria This has been stable   Lab Results  Component Value Date   CREATININE 1.30 02/25/2016   BUN 23 02/25/2016   NA 137 02/25/2016   K 4.0 02/25/2016   CL 104 02/25/2016   CO2 25 02/25/2016    Hypercholesterolemia: Well controlled on Lipitor 20 mg, tends to have low HDL   Lab Results  Component Value Date   CHOL 115 02/25/2016   HDL 37.70 (L) 02/25/2016   LDLCALC 58 02/25/2016   LDLDIRECT 67.0 08/02/2015   TRIG 96.0 02/25/2016   CHOLHDL 3 02/25/2016    EXAM:   BP 132/70   Pulse 68   Ht 5\' 8"  (1.727 m)   Wt 271 lb (122.9 kg)   SpO2 96%   BMI 41.21 kg/m      ASSESSMENT/PLAN:  DIABETES and obesity:  See history of present illness for detailed discussion on current blood sugar patterns and problems identified His A1c has been over 8% consistently, last A1c was in December  He has checked only a few blood sugars in the mornings and they are mostly high although he thinks these are higher because of  excessive eating late in the evening and some high-fat meals and snacks also Also may have had higher readings when he had a respiratory infection about a month ago Although he is continuing Invokana he is gaining weight Has only in the last few days had done a little walking for exercise  Recommendations:  Improve diet, reduce low fat meals and snacks at night.  Discussed need to check blood sugars more often when he has any intercurrent illness  Discussed use of the FreeStyle [place] and how this would be helpful, he will call his pump supply company to get this approved He will need to let us know if he is seeing consistently high readings in the mornings with this  Continue Invokana  Start using exercise bike and walking  HYPERTENSION: Blood pressure is improved  Hypogonadism: He needs new prescription and will need follow-up labs on the next visit    Patient Instructions  Extend bolus for hi fat meals    Counseling time on subjects discussed above is over 50% of today's 25 minute visit     [facility] 04/11/2016

## 2016-04-11 NOTE — Patient Instructions (Addendum)
Extend bolus for hi fat meals

## 2016-04-13 ENCOUNTER — Other Ambulatory Visit: Payer: Self-pay

## 2016-04-13 MED ORDER — TESTOSTERONE 50 MG/5GM (1%) TD GEL: TRANSDERMAL | 2 refills | Status: DC

## 2016-04-13 NOTE — Telephone Encounter (Signed)
This was faxed with confirmation on 04/11/16

## 2016-04-13 NOTE — Telephone Encounter (Signed)
Patient stated you sent testosterone medication to the [place] to   [id number] [place], [place] [phone] (Phone) [phone] (Fax)

## 2016-04-14 NOTE — Telephone Encounter (Signed)
Was faxed with confirmation on the 13th to this pharmacy- re-faxed on 04/14/16

## 2016-04-18 ENCOUNTER — Telehealth: Payer: Self-pay

## 2016-04-20 ENCOUNTER — Other Ambulatory Visit: Payer: Self-pay

## 2016-04-20 MED ORDER — FREESTYLE LIBRE READER DEVI: 1.0000 | 1 refills | Status: DC

## 2016-04-20 MED ORDER — FREESTYLE LIBRE SENSOR SYSTEM MISC: 1.0000 | 3 refills | Status: DC

## 2016-04-20 NOTE — Telephone Encounter (Signed)
[facility] libre [facility] [address] please with the diagnosis code on it.

## 2016-04-20 NOTE — Telephone Encounter (Signed)
Ordered 04/20/16

## 2016-04-21 ENCOUNTER — Other Ambulatory Visit: Payer: Self-pay | Admitting: Endocrinology

## 2016-05-04 ENCOUNTER — Other Ambulatory Visit: Payer: Self-pay

## 2016-05-04 MED ORDER — FREESTYLE LIBRE READER DEVI: 1.0000 | 1 refills | Status: DC

## 2016-05-04 MED ORDER — FREESTYLE LIBRE SENSOR SYSTEM MISC: 1.0000 | 3 refills | Status: DC

## 2016-05-04 NOTE — Telephone Encounter (Signed)
Spoke with patient and I will call the health care agent tomorrow to get this resolved

## 2016-05-04 NOTE — Telephone Encounter (Signed)
[facility] is requesting   Continuous Blood Gluc Receiver (FREESTYLE LIBRE READER) DEVI  please asdvise

## 2016-05-05 ENCOUNTER — Telehealth: Payer: Self-pay | Admitting: Endocrinology

## 2016-05-05 NOTE — Telephone Encounter (Signed)
This can not be resolved until patient shows verification he is testing 4 times daily per Dr. Dwyane Dee

## 2016-05-05 NOTE — Telephone Encounter (Signed)
Pt sent handwritten BS readings for the freestyle libre, let the pt know that the handwritten results will not work if he could assist [place] with the readings from his meter or pump that would be what is needed. He does not believe the pump or meter will show his results as 4 times daily checking and he has become frustrated and would rather just not get this device.

## 2016-05-11 ENCOUNTER — Telehealth: Payer: Self-pay | Admitting: Endocrinology

## 2016-05-11 NOTE — Telephone Encounter (Signed)
[facility] called to check the status of a request they have sent over,  CB# [phone]

## 2016-05-14 ENCOUNTER — Other Ambulatory Visit: Payer: Self-pay | Admitting: Endocrinology

## 2016-05-15 ENCOUNTER — Other Ambulatory Visit: Payer: Self-pay | Admitting: Endocrinology

## 2016-05-15 NOTE — Telephone Encounter (Signed)
Spoke with Paul Adkins at [facility] and explained that patient needs to be able to show he is testing 4 times a day- we can get this by downloading the pump-

## 2016-06-05 ENCOUNTER — Ambulatory Visit (INDEPENDENT_AMBULATORY_CARE_PROVIDER_SITE_OTHER): Payer: Medicare Other | Admitting: Ophthalmology

## 2016-06-05 DIAGNOSIS — E113311 Type 2 diabetes mellitus with moderate nonproliferative diabetic retinopathy with macular edema, right eye: Secondary | ICD-10-CM

## 2016-06-05 DIAGNOSIS — E113592 Type 2 diabetes mellitus with proliferative diabetic retinopathy without macular edema, left eye: Secondary | ICD-10-CM | POA: Diagnosis not present

## 2016-06-05 DIAGNOSIS — E11311 Type 2 diabetes mellitus with unspecified diabetic retinopathy with macular edema: Secondary | ICD-10-CM | POA: Diagnosis not present

## 2016-06-05 DIAGNOSIS — H35033 Hypertensive retinopathy, bilateral: Secondary | ICD-10-CM

## 2016-06-05 DIAGNOSIS — I10 Essential (primary) hypertension: Secondary | ICD-10-CM

## 2016-06-05 DIAGNOSIS — H43813 Vitreous degeneration, bilateral: Secondary | ICD-10-CM

## 2016-06-05 LAB — HM DIABETES EYE EXAM

## 2016-06-06 ENCOUNTER — Other Ambulatory Visit (INDEPENDENT_AMBULATORY_CARE_PROVIDER_SITE_OTHER): Payer: Medicare Other

## 2016-06-06 DIAGNOSIS — E1065 Type 1 diabetes mellitus with hyperglycemia: Secondary | ICD-10-CM

## 2016-06-06 DIAGNOSIS — E291 Testicular hypofunction: Secondary | ICD-10-CM | POA: Diagnosis not present

## 2016-06-06 LAB — COMPREHENSIVE METABOLIC PANEL
ALBUMIN: 3.6 g/dL (ref 3.5–5.2)
ALK PHOS: 68 U/L (ref 39–117)
ALT: 19 U/L (ref 0–53)
AST: 16 U/L (ref 0–37)
BUN: 23 mg/dL (ref 6–23)
CALCIUM: 8.5 mg/dL (ref 8.4–10.5)
CO2: 25 mEq/L (ref 19–32)
Chloride: 109 mEq/L (ref 96–112)
Creatinine, Ser: 1.31 mg/dL (ref 0.40–1.50)
GFR: 57.75 mL/min — AB (ref >60.00)
Glucose, Bld: 172 mg/dL — ABNORMAL HIGH (ref 70–99)
POTASSIUM: 4.3 meq/L (ref 3.5–5.1)
Sodium: 139 mEq/L (ref 135–145)
TOTAL PROTEIN: 6 g/dL (ref 6.0–8.3)
Total Bilirubin: 0.4 mg/dL (ref 0.2–1.2)

## 2016-06-06 LAB — TESTOSTERONE: TESTOSTERONE: 392.7 ng/dL (ref 300.00–890.00)

## 2016-06-06 LAB — HEMOGLOBIN A1C: Hgb A1c MFr Bld: 8.3 % — ABNORMAL HIGH (ref 4.6–6.5)

## 2016-06-09 ENCOUNTER — Ambulatory Visit (INDEPENDENT_AMBULATORY_CARE_PROVIDER_SITE_OTHER): Payer: Medicare Other | Admitting: Endocrinology

## 2016-06-09 ENCOUNTER — Encounter: Payer: Self-pay | Admitting: Endocrinology

## 2016-06-09 VITALS — BP 130/72 | HR 81 | Ht 68.0 in | Wt 270.0 lb

## 2016-06-09 DIAGNOSIS — E291 Testicular hypofunction: Secondary | ICD-10-CM

## 2016-06-09 DIAGNOSIS — E1065 Type 1 diabetes mellitus with hyperglycemia: Secondary | ICD-10-CM

## 2016-06-09 DIAGNOSIS — I10 Essential (primary) hypertension: Secondary | ICD-10-CM

## 2016-06-09 NOTE — Patient Instructions (Signed)
Basal 6am to 6pm and 6 pm -8 pm is 2.2

## 2016-06-09 NOTE — Progress Notes (Signed)
Patient ID: Paul Adkins, male   DOB: Jun 02, 1945, 71 y.o.   MRN: [id number]    Reason for visit:   follow-up  Diagnosis: Type 1 diabetes, date of onset 1978  PAST history: He has had persistently poorly controlled diabetes for several years. A1c has been high with usual range 8.2 -9, overall improved since 2013. Compliance with glucose monitoring and diet has been variable and he does better when he is checking more sugars. His A1c was better than usual in 3/14 at 7.5 but he was also having significant hypoglycemia overnight and also occasionally later in the day. At that time he was started on Invokana 300 mg daily which helped him with glucose control and mild weight loss  CURRENT insulin pump brand:  Medtronic   PUMP SETTINGS are:   Basal rates: 2.25 from midnight- 4 AM. 4 AM = 2.0.  8:30 pM = 2.6.  Boluses : 1 unit for 6 g carbs at mealtimes and blood sugar target 110-120, sensitivity 1:30overnight, 1:25 until 8 PM and then 1:50 with active insulin 3 hours.  Changing infusion set every 3 days Average total daily insulin = 87 units  RECENT history:   His A1c is still about the same at 8.3, last A1c was higher at 8.4%  Current blood sugar patterns and problems:  He has checked his blood sugars with the freestyle Libre sensor now using a sample for the last 10 days  However this appears to be needing falsely low with an average of 113 compared to his fingerstick average of 177; he thinks the fingerstick readings are at least 40 mg over the sensor readings  He is however checking his blood sugar 4 or more times a day now with the fingerstick since he is more motivated to follow his blood sugar patterns at various times  FASTING readings appear to be mostly high with a good reading only once  POSTPRANDIAL readings are somewhat difficult to assess as he frequently has multiple small meals and difficult to correlate the actual numbers with postprandial readings including in  the evening as he does not always check readings later at night  According to the sensor his OVERNIGHT blood sugars are fairly stable with some variability in the early part of the night but subsequently showing.  General rise in blood sugar until about 8 AM so  Also HIGHEST blood sugars on the sensor around 9 PM even though his mealtimes are quite variable in the evening  Recently has not had any significant hypoglycemia with the lowest fingerstick 69 and the sensor reading about 50 at the same time  His weight has leveled off  He did try to exercise for a couple of weeks and felt better with this but currently not motivated  Overall his carbohydrate intake on a daily basis is averaging 219   Changing infusion set every 3-4 days  GLUCOSE readings: Reviewed for the last 2 weeks from pump download:  Mean values apply above for all meters except median for One Touch  PRE-MEAL Fasting Lunch Dinner Bedtime Overall  Glucose range:  10 5-298   1 19-215   1 17-276   69-222    Mean/median: 180  158  152   177   POST-MEAL PC Breakfast PC Lunch PC Dinner  Glucose range:     Mean/median: 183  217     Physical activity: none Recently, previously was exercising for a couple of weeks   Wt Readings from Last 3 Encounters:  06/09/16 270 lb (122.5 kg)  04/11/16 271 lb (122.9 kg)  02/29/16 264 lb (119.7 kg)    Lab Results  Component Value Date   HGBA1C 8.3 (H) 06/06/2016   HGBA1C 8.4 (H) 02/25/2016   HGBA1C 7.9 (H) 11/03/2015   Lab Results  Component Value Date   MICROALBUR <0.7 08/02/2015   LDLCALC 58 02/25/2016   CREATININE 1.31 06/06/2016    OTHER problems addressed today are in review of systems   Lab on 06/06/2016  Component Date Value Ref Range Status  . Hgb A1c MFr Bld 06/06/2016 8.3* 4.6 - 6.5 % Final  . Sodium 06/06/2016 139  135 - 145 mEq/L Final  . Potassium 06/06/2016 4.3  3.5 - 5.1 mEq/L Final  . Chloride 06/06/2016 109  96 - 112 mEq/L Final  . CO2 06/06/2016 25   19 - 32 mEq/L Final  . Glucose, Bld 06/06/2016 172* 70 - 99 mg/dL Final  . BUN 06/06/2016 23  6 - 23 mg/dL Final  . Creatinine, Ser 06/06/2016 1.31  0.40 - 1.50 mg/dL Final  . Total Bilirubin 06/06/2016 0.4  0.2 - 1.2 mg/dL Final  . Alkaline Phosphatase 06/06/2016 68  39 - 117 U/L Final  . AST 06/06/2016 16  0 - 37 U/L Final  . ALT 06/06/2016 19  0 - 53 U/L Final  . Total Protein 06/06/2016 6.0  6.0 - 8.3 g/dL Final  . Albumin 06/06/2016 3.6  3.5 - 5.2 g/dL Final  . Calcium 06/06/2016 8.5  8.4 - 10.5 mg/dL Final  . GFR 06/06/2016 57.75* >60.00 mL/min Final  . Testosterone 06/06/2016 392.70  300.00 - 890.00 ng/dL Final    Allergies as of 06/09/2016      Reactions   Viagra [sildenafil Citrate]       Medication List       Accurate as of 06/09/16  9:02 AM. Always use your most recent med list.          amLODipine 5 MG tablet Commonly known as:  NORVASC TAKE 1 TABLET (5 MG TOTAL) BY MOUTH DAILY.   aspirin 81 MG tablet Take 81 mg by mouth daily.   atorvastatin 20 MG tablet Commonly known as:  LIPITOR TAKE 1 TABLET (20 MG TOTAL) BY MOUTH DAILY.   azithromycin 250 MG tablet Commonly known as:  ZITHROMAX 2 tablets today then 1 tablet daily until finished   FREESTYLE LIBRE READER Devi 1 Device by Does not apply route continuous. [place] [id number] DX code E10.65   FREESTYLE LIBRE SENSOR SYSTEM Misc Apply 1 strip topically as directed. Apply 1 sensor every 10 days    DX code E10.65   glucose blood test strip Commonly known as:  BAYER CONTOUR NEXT TEST Use as instructed to check blood sugars 4 times per day dx code E10.65   HUMALOG 100 UNIT/ML injection Generic drug:  insulin lispro USE MAX 110 UNITS PER DAY WITH INSULIN PUMP   INVOKANA 300 MG Tabs tablet Generic drug:  canagliflozin TAKE 1 TABLET BY MOUTH EVERY DAY   lisinopril-hydrochlorothiazide 20-12.5 MG tablet Commonly known as:  PRINZIDE,ZESTORETIC TAKE 1 TABLET BY MOUTH DAILY.   methylphenidate 10 MG CR  capsule Commonly known as:  METADATE CD Take 1 capsule (10 mg total) by mouth every morning.   PARoxetine 40 MG tablet Commonly known as:  PAXIL TAKE 1 TABLET (40 MG TOTAL) BY MOUTH AT BEDTIME.   testosterone 50 MG/5GM (1%) Gel Commonly known as:  ANDROGEL USE 1 AND 1/2 packets ONTO THE SKIN DAILY  AS DIRECTED   valsartan 160 MG tablet Commonly known as:  DIOVAN TAKE 1 TABLET BY MOUTH DAILY.       Allergies:  Allergies  Allergen Reactions  . Viagra [Sildenafil Citrate]     Past Medical History:  Diagnosis Date  . Carpal tunnel syndrome   . Depression   . Diabetes mellitus without complication ([place])   . ED (erectile dysfunction)   . Edema   . Hyperlipidemia   . Hypertension   . Lumbar disc disease   . Neuropathy ([place])   . Stroke [facility])     Past Surgical History:  Procedure Laterality Date  . BACK SURGERY    . VASECTOMY      Family History  Problem Relation Age of Onset  . Cancer Father     Prostate  . Diabetes Neg Hx     Social History:  reports that he has never smoked. He has never used smokeless tobacco. He reports that he does not drink alcohol or use drugs.  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   Currently on AndroGel 5 g packets, Using 1-1/2 packets daily He is is compliant with this recently and his level is back to normal    Lab Results  Component Value Date   TESTOSTERONE 392.70 06/06/2016     HYPERTENSION: Currently taking Valsartan 160 mg along with Invokana 150 mg and 5 mg amlodipine   His creatinine tends to be upper normal without evidence of diabetic nephropathy This has been stable for some time   Lab Results  Component  Value Date   CREATININE 1.31 06/06/2016   BUN 23 06/06/2016   NA 139 06/06/2016   K 4.3 06/06/2016   CL 109 06/06/2016   CO2 25 06/06/2016    Hypercholesterolemia: Well controlled on Lipitor 20 mg, tends to have low HDL   Lab Results  Component Value Date   CHOL 115 02/25/2016   HDL 37.70 (L) 02/25/2016   LDLCALC 58 02/25/2016   LDLDIRECT 67.0 08/02/2015   TRIG 96.0 02/25/2016   CHOLHDL 3 02/25/2016    Has history of retinopathy  EXAM:   BP 130/72   Pulse 81   Ht 5\' 8"  (1.727 m)   Wt 270 lb (122.5 kg)   BMI 41.05 kg/m      ASSESSMENT/PLAN:  DIABETES and obesity:  See history of present illness for detailed discussion on current blood sugar patterns and problems identified His A1c has been over 8% consistently, last A1c was 8.3 and relatively stable  He has checked blood sugars very consistently now at various times of the day to help adjust his boluses Overall tends to have relatively high readings around breakfast time at 8 AM as well as periodic high readings throughout the day Most of his high readings in the evenings are around 7-9 PM which may or may not be postprandial As discussed above is FreeStyle Libre sensor appears to be falsely low with his first sensor which was a sample He is however much more motivated to follow his blood sugars and bolus appropriately for high readings with using the sensor which he has not done in several years before  Recommendations: Basal rate 2.2 between 6 AM  and 8 AM and 6 PM and 8 PM New prescription for sensor will be given Hopefully his prescription sensor will be more accurate, he can try to use a different site also Encouraged him to continue bolusing with using his blood sugar data to help with correction whenever blood sugar is higher   Continue Invokana unchanged   Start using exercise bike  or other exercises  Follow-up in 3 months  HYPERTENSION: Blood pressure is  control  Hypogonadism: Testosterone level  is normal and will continue 1-1/2 packets a day     There are no Patient Instructions on file for this visit.  Counseling time on subjects discussed above is over 50% of today's 25 minute visit     [facility] 06/09/2016

## 2016-06-12 ENCOUNTER — Other Ambulatory Visit: Payer: Self-pay | Admitting: Endocrinology

## 2016-06-17 ENCOUNTER — Other Ambulatory Visit: Payer: Self-pay | Admitting: Endocrinology

## 2016-06-27 ENCOUNTER — Other Ambulatory Visit: Payer: Self-pay | Admitting: Endocrinology

## 2016-07-15 ENCOUNTER — Other Ambulatory Visit: Payer: Self-pay | Admitting: Endocrinology

## 2016-08-01 ENCOUNTER — Other Ambulatory Visit: Payer: Self-pay

## 2016-08-01 ENCOUNTER — Telehealth: Payer: Self-pay | Admitting: Endocrinology

## 2016-08-01 MED ORDER — FUROSEMIDE 20 MG PO TABS: 20.0000 mg | ORAL_TABLET | Freq: Every day | ORAL | 0 refills | Status: DC

## 2016-08-01 NOTE — Telephone Encounter (Signed)
Called patient and sent Rx to pharmacy and also scheduled labs and office visit.

## 2016-08-01 NOTE — Telephone Encounter (Signed)
He does not have a prescription at home with can start Lasix 20 mg daily and have him schedule follow-up as soon as possible with labs

## 2016-08-01 NOTE — Telephone Encounter (Signed)
Called patient and he stated that he has had severely swollen ankles. He also stated that his blood sugars have been all over the board. Please advise.

## 2016-08-01 NOTE — Telephone Encounter (Signed)
Patient needing to speak with nurse when possible. Stated he was unsure if he needed to come in,  Ankles are swollen.

## 2016-08-03 ENCOUNTER — Other Ambulatory Visit (INDEPENDENT_AMBULATORY_CARE_PROVIDER_SITE_OTHER): Payer: Medicare Other

## 2016-08-03 DIAGNOSIS — E1065 Type 1 diabetes mellitus with hyperglycemia: Secondary | ICD-10-CM | POA: Diagnosis not present

## 2016-08-03 LAB — BASIC METABOLIC PANEL
BUN: 33 mg/dL — AB (ref 6–23)
CALCIUM: 8.9 mg/dL (ref 8.4–10.5)
CO2: 24 mEq/L (ref 19–32)
CREATININE: 1.53 mg/dL — AB (ref 0.40–1.50)
Chloride: 107 mEq/L (ref 96–112)
GFR: 48.25 mL/min — ABNORMAL LOW (ref >60.00)
GLUCOSE: 127 mg/dL — AB (ref 70–99)
Potassium: 4.1 mEq/L (ref 3.5–5.1)
SODIUM: 138 meq/L (ref 135–145)

## 2016-08-03 LAB — MICROALBUMIN / CREATININE URINE RATIO
Creatinine,U: 91.5 mg/dL
Microalb Creat Ratio: 0.8 mg/g (ref 0.0–30.0)
Microalb, Ur: 0.7 mg/dL (ref 0.0–1.9)

## 2016-08-03 LAB — HEMOGLOBIN A1C: Hgb A1c MFr Bld: 8.4 % — ABNORMAL HIGH (ref 4.6–6.5)

## 2016-08-07 NOTE — Progress Notes (Signed)
Patient ID: Paul Adkins, male   DOB: [birth year], 71 y.o.   MRN: [id number]    Reason for visit:   follow-up  Diagnosis: Type 1 diabetes, date of onset 1978  PAST history: He has had persistently poorly controlled diabetes for several years. A1c has been high with usual range 8.2 -9, overall improved since 2013. Compliance with glucose monitoring and diet has been variable and he does better when he is checking more sugars. His A1c was better than usual in 3/14 at 7.5 but he was also having significant hypoglycemia overnight and also occasionally later in the day. At that time he was started on Invokana 300 mg daily which helped him with glucose control and mild weight loss  CURRENT insulin pump brand:  Medtronic   PUMP SETTINGS are:   Basal rates: 2.25 from midnight- 4 AM. 4 AM = 2.0.  8:30 pM = 2.6.  Boluses : 1 unit for 6 g carbs at mealtimes and blood sugar target 110-120, sensitivity 1:30overnight, 1:25 until 8 PM and then 1:50 with active insulin 3 hours.  Changing infusion set every 3 days Average total daily insulin = 87 units  RECENT history:   His A1c is still about the same at 8.4  Current blood sugar patterns and problems:  He has checked his blood sugars with the freestyle Libre sensor and also fingersticks  He again says that his sensor is reading about 30-40 milligrams lower than the fingersticks  He is using his fingersticks 4 bolusing but has done only a few readings in the last week or so  With his continuous glucose sensor his average is 129 which is much lower than expected, fingerstick average is 183 when he is checking  FASTING readings have been generally higher when he has a a few good readings; with his continuous glucose monitoring his average blood sugar in the mornings appear to be 104 only  He has low normal readings overnight at times with his sensor  POSTPRANDIAL readings are somewhat higher midmorning but mostly higher after his evening  meal peaking around 6 PM or so; on the sensor his highest average of the day is 179 between 6-8 PM  He does tend to have snacks after his evening meal again and he tries to bolus for most of these; difficult to know what her postprandial patterns are with the evening carbohydrate intake; on the sensor he has been difficult and variability in his readings in the evenings  He has gained a little weight although has had some swelling also    Changing infusion set every 3-4 days  GLUCOSE readings: Reviewed for the last 2 weeks from pump download:  Mean values apply above for all meters except median for One Touch  PRE-MEAL Fasting Lunch Dinner Overnight  Overall  Glucose range: 128-200   1 12-274  1 27-242  241, 260    Mean/median: 197   170   183    POST-MEAL PC Breakfast PC Lunch PC Dinner  Glucose range:   1 70-276   Mean/median:   218    Physical activity: none, previously was exercising On the bike   Wt Readings from Last 3 Encounters:  08/08/16 274 lb 6.4 oz (124.5 kg)  06/09/16 270 lb (122.5 kg)  04/11/16 271 lb (122.9 kg)    Lab Results  Component Value Date   HGBA1C 8.4 (H) 08/03/2016   HGBA1C 8.3 (H) 06/06/2016   HGBA1C 8.4 (H) 02/25/2016   Lab Results  Component Value Date   MICROALBUR 0.7 08/03/2016   LDLCALC 58 02/25/2016   CREATININE 1.53 (H) 08/03/2016    OTHER problems addressed today are in review of systems    Lab on 08/03/2016  Component Date Value Ref Range Status  . Hgb A1c MFr Bld 08/03/2016 8.4* 4.6 - 6.5 % Final   Glycemic Control Guidelines for People with Diabetes:Non Diabetic:  <6%Goal of Therapy: <7%Additional Action Suggested:  >8%   . Sodium 08/03/2016 138  135 - 145 mEq/L Final  . Potassium 08/03/2016 4.1  3.5 - 5.1 mEq/L Final  . Chloride 08/03/2016 107  96 - 112 mEq/L Final  . CO2 08/03/2016 24  19 - 32 mEq/L Final  . Glucose, Bld 08/03/2016 127* 70 - 99 mg/dL Final  . BUN 08/03/2016 33* 6 - 23 mg/dL Final  . Creatinine, Ser  08/03/2016 1.53* 0.40 - 1.50 mg/dL Final  . Calcium 08/03/2016 8.9  8.4 - 10.5 mg/dL Final  . GFR 08/03/2016 48.25* >60.00 mL/min Final  . Microalb, Ur 08/03/2016 0.7  0.0 - 1.9 mg/dL Final  . Creatinine,U 08/03/2016 91.5  mg/dL Final  . Microalb Creat Ratio 08/03/2016 0.8  0.0 - 30.0 mg/g Final    Allergies as of 08/08/2016      Reactions   Viagra [sildenafil Citrate]       Medication List       Accurate as of 08/08/16  9:10 AM. Always use your most recent med list.          amLODipine 5 MG tablet Commonly known as:  NORVASC TAKE 1 TABLET BY MOUTH EVERY DAY   aspirin 81 MG tablet Take 81 mg by mouth daily.   atorvastatin 20 MG tablet Commonly known as:  LIPITOR TAKE 1 TABLET BY MOUTH EVERY DAY   FREESTYLE LIBRE READER Devi 1 Device by Does not apply route continuous. [place] [id number] DX code E10.65   FREESTYLE LIBRE SENSOR SYSTEM Misc Apply 1 strip topically as directed. Apply 1 sensor every 10 days    DX code E10.65   furosemide 20 MG tablet Commonly known as:  LASIX Take 1 tablet (20 mg total) by mouth daily.   glucose blood test strip Commonly known as:  BAYER CONTOUR NEXT TEST Use as instructed to check blood sugars 4 times per day dx code E10.65   HUMALOG 100 UNIT/ML injection Generic drug:  insulin lispro USE MAX 110 UNITS PER DAY WITH INSULIN PUMP   INVOKANA 300 MG Tabs tablet Generic drug:  canagliflozin TAKE 1 TABLET BY MOUTH EVERY DAY   PARoxetine 40 MG tablet Commonly known as:  PAXIL TAKE 1 TABLET (40 MG TOTAL) BY MOUTH AT BEDTIME.   testosterone 50 MG/5GM (1%) Gel Commonly known as:  ANDROGEL USE 1 AND 1/2 packets ONTO THE SKIN DAILY AS DIRECTED   valsartan 160 MG tablet Commonly known as:  DIOVAN TAKE 1 TABLET BY MOUTH DAILY.       Allergies:  Allergies  Allergen Reactions  . Viagra [Sildenafil Citrate]     Past Medical History:  Diagnosis Date  . Carpal tunnel syndrome   . Depression   . Diabetes mellitus without  complication ([place])   . ED (erectile dysfunction)   . Edema   . Hyperlipidemia   . Hypertension   . Lumbar disc disease   . Neuropathy   . Stroke [facility])     Past Surgical History:  Procedure Laterality Date  . BACK SURGERY    . VASECTOMY  Family History  Problem Relation Age of Onset  . Cancer Father        Prostate  . Diabetes Neg Hx     Social History:  reports that he has never smoked. He has never used smokeless tobacco. He reports that he does not drink alcohol or use drugs.  REVIEW of systems:   EDEMA: He says he has had mild swelling of his legs for some time but this got worse last month and he was started on Lasix 20 mg daily for this.  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   Currently on AndroGel 5 g packets, Using 1-1/2 packets daily He is is compliant with this his last level is normal    Lab Results  Component Value Date   TESTOSTERONE 392.70 06/06/2016     HYPERTENSION: Currently taking Valsartan 160 mg along with Invokana 150 mg and 5 mg amlodipine   His creatinine tends to be upper normal without evidence of diabetic nephropathy This Appears to be relatively higher now   Lab Results  Component Value Date   CREATININE 1.53 (H) 08/03/2016   BUN 33 (H) 08/03/2016   NA 138 08/03/2016   K 4.1 08/03/2016   CL 107 08/03/2016   CO2 24 08/03/2016    Hypercholesterolemia: Well controlled on Lipitor 20 mg, tends to have low HDL   Lab Results  Component Value Date   CHOL 115 02/25/2016   HDL 37.70 (L) 02/25/2016   LDLCALC 58 02/25/2016   LDLDIRECT 67.0 08/02/2015   TRIG 96.0 02/25/2016   CHOLHDL 3 02/25/2016    Has history of retinopathy  EXAM:   BP 140/64 (BP Location: Left Arm, Cuff Size: Large)   Pulse 70   Ht 5\' 8"  (1.727 m)   Wt 274 lb 6.4 oz (124.5 kg)    SpO2 98%   BMI 41.72 kg/m      ASSESSMENT/PLAN:  HYPERTENSION: Blood pressure is High normal although relatively lower standing when checked as second time He just started on Lasix and may continue to improve blood pressure with elimination of edema  RENAL dysfunction: His creatinine is higher than usual and may be related to a combination of using Invokana and Lasix and prerenal azotemia For now will stop his Invokana  DIABETES and obesity:  See history of present illness for detailed discussion on current blood sugar patterns and problems identified  His A1c has been over 8% consistently, it is now being high at 8.4 His main difficulty is controlling postprandial readings in the evenings and blood sugars are variably high, however most of his readings are tending to be high and he may be able to get better controlled with increasing his carbohydrate ratio Currently not exercising and still not able to lose weight Now with using the FreeStyle sensor he has had some more data to work with but he is still finding this to be 30-40 points lower Difficult to assess his actual controlled with the sensor because it is reading falsely low however does not indicate that there is any need to change his basal rate especially overnight  Recommendations:    He will look into the DexCom sensor or the guardian connect, discussed in detail how these work and brochure given.  There should be much more accurate and also will alert him before his blood sugars are getting  higher or lower  Stop Invokana  Carbohydrate ratio 1:5 at suppertime instead of 1:6  Continue Lasix  Follow-up in 1 month      Patient Instructions  Stop Invokana     Total visit time for evaluation and management of multiple problems, counseling, downloading meter and continuous sensor, review of downloaded reports, review of labs, medication review = 25 minutes    Bostyn Bogie 08/08/2016

## 2016-08-08 ENCOUNTER — Encounter: Payer: Self-pay | Admitting: Endocrinology

## 2016-08-08 ENCOUNTER — Ambulatory Visit (INDEPENDENT_AMBULATORY_CARE_PROVIDER_SITE_OTHER): Payer: Medicare Other | Admitting: Endocrinology

## 2016-08-08 VITALS — BP 140/64 | HR 70 | Ht 68.0 in | Wt 274.4 lb

## 2016-08-08 DIAGNOSIS — R600 Localized edema: Secondary | ICD-10-CM | POA: Diagnosis not present

## 2016-08-08 DIAGNOSIS — I10 Essential (primary) hypertension: Secondary | ICD-10-CM | POA: Diagnosis not present

## 2016-08-08 DIAGNOSIS — E1065 Type 1 diabetes mellitus with hyperglycemia: Secondary | ICD-10-CM

## 2016-08-08 DIAGNOSIS — N289 Disorder of kidney and ureter, unspecified: Secondary | ICD-10-CM

## 2016-08-08 NOTE — Patient Instructions (Addendum)
Stop Invokana

## 2016-08-26 ENCOUNTER — Other Ambulatory Visit: Payer: Self-pay | Admitting: Endocrinology

## 2016-08-29 ENCOUNTER — Other Ambulatory Visit: Payer: Medicare Other

## 2016-08-29 ENCOUNTER — Ambulatory Visit: Payer: Medicare Other | Admitting: Endocrinology

## 2016-09-01 ENCOUNTER — Ambulatory Visit: Payer: Medicare Other | Admitting: Endocrinology

## 2016-09-11 ENCOUNTER — Other Ambulatory Visit (INDEPENDENT_AMBULATORY_CARE_PROVIDER_SITE_OTHER): Payer: Medicare Other

## 2016-09-11 DIAGNOSIS — E1065 Type 1 diabetes mellitus with hyperglycemia: Secondary | ICD-10-CM

## 2016-09-11 LAB — BASIC METABOLIC PANEL
BUN: 25 mg/dL — AB (ref 6–23)
CHLORIDE: 106 meq/L (ref 96–112)
CO2: 25 meq/L (ref 19–32)
Calcium: 8.7 mg/dL (ref 8.4–10.5)
Creatinine, Ser: 1.34 mg/dL (ref 0.40–1.50)
GFR: 56.22 mL/min — ABNORMAL LOW (ref >60.00)
Glucose, Bld: 98 mg/dL (ref 70–99)
POTASSIUM: 4 meq/L (ref 3.5–5.1)
Sodium: 140 mEq/L (ref 135–145)

## 2016-09-14 ENCOUNTER — Encounter: Payer: Self-pay | Admitting: Endocrinology

## 2016-09-14 ENCOUNTER — Ambulatory Visit (INDEPENDENT_AMBULATORY_CARE_PROVIDER_SITE_OTHER): Payer: Medicare Other | Admitting: Endocrinology

## 2016-09-14 VITALS — BP 130/74 | HR 68 | Ht 68.0 in | Wt 273.2 lb

## 2016-09-14 DIAGNOSIS — E291 Testicular hypofunction: Secondary | ICD-10-CM

## 2016-09-14 DIAGNOSIS — E1065 Type 1 diabetes mellitus with hyperglycemia: Secondary | ICD-10-CM

## 2016-09-14 NOTE — Progress Notes (Signed)
Patient ID: Paul Adkins, male   DOB: September 18, 1945, 71 y.o.   MRN: [id number]    Reason for visit:   follow-up  Diagnosis: Type 1 diabetes, date of onset 1978  PAST history: He has had persistently poorly controlled diabetes for several years. A1c has been high with usual range 8.2 -9, overall improved since 2013. Compliance with glucose monitoring and diet has been variable and he does better when he is checking more sugars. His A1c was better than usual in 3/14 at 7.5 but he was also having significant hypoglycemia overnight and also occasionally later in the day. At that time he was started on Invokana 300 mg daily which helped him with glucose control and mild weight loss  CURRENT insulin pump brand:  Medtronic   PUMP SETTINGS are:   Basal rates: 2.25 from midnight- 4 AM. 4 AM = 2.  75.  8:30 pM = 2.6.  Boluses : 1 unit for 6 g carbs at mealtimes and blood sugar target 110-120, sensitivity 1:30overnight, 1:25 until 8 PM and then 1:50 with active insulin 3 hours.  Changing infusion set every 3 days Average total daily insulin = 100 with 64 units basal units  RECENT history:   His A1c is still about the same at 8.4 as of 6/18  Current blood sugar patterns and problems:  He has at least for the last 2 weeks markedly improved blood sugars  He says that because of his kidney dysfunction on his last visit he decided to change his diet and starting to follow renal diet from the Internet with cutting back on a lot of beef products, dairy products and also cutting back on carbohydrates overall  He is also is checking his blood sugars several times a day compared to before and recently has not used the freestyle [place] which was not as accurate  However he has not changed his carbohydrate coverage in the evening that was suggested because of high postprandial reading  Only occasionally when he is eating larger amounts of carbohydrate with less sugar go up such as last night  Also  occasionally he will have some infusion site issues causing higher readings  Weight is stable  BOLUSES: He is doing several boluses of day sometimes because of snacks and small meals but not always checking blood sugar at the time of the bolus  FASTING readings have been not as high and frequently near normal with a couple of high readings possibly from infusion set problem or eating more the night before  He has not checked readings after meals as much consistently but they appear to be fairly good after lunch when he checks them and usually not high after supper are breakfast  Also now hypoglycemic symptoms or documentation of low sugars  Weight is about the same despite stopping INVOKANA  This was stopped because of renal dysfunction  Still not motivated to start exercise    Changing infusion set every 3-4 days  GLUCOSE readings: Reviewed for the last 2 weeks from pump download:  Mean values apply above for all meters except median for One Touch  PRE-MEAL Fasting Lunch Dinner Bedtime Overall  Glucose range:  83-376     81-236    Mean/median: 157 118 103 156 154     Physical activity: none, previously was exercising On the bike   Wt Readings from Last 3 Encounters:  09/14/16 273 lb 3.2 oz (123.9 kg)  08/08/16 274 lb 6.4 oz (124.5 kg)  06/09/16 270  lb (122.5 kg)    Lab Results  Component Value Date   HGBA1C 8.4 (H) 08/03/2016   HGBA1C 8.3 (H) 06/06/2016   HGBA1C 8.4 (H) 02/25/2016   Lab Results  Component Value Date   MICROALBUR 0.7 08/03/2016   LDLCALC 58 02/25/2016   CREATININE 1.34 09/11/2016    OTHER problems addressed today are in review of systems    Appointment on 09/11/2016  Component Date Value Ref Range Status  . Sodium 09/11/2016 140  135 - 145 mEq/L Final  . Potassium 09/11/2016 4.0  3.5 - 5.1 mEq/L Final  . Chloride 09/11/2016 106  96 - 112 mEq/L Final  . CO2 09/11/2016 25  19 - 32 mEq/L Final  . Glucose, Bld 09/11/2016 98  70 - 99 mg/dL  Final  . BUN 09/11/2016 25* 6 - 23 mg/dL Final  . Creatinine, Ser 09/11/2016 1.34  0.40 - 1.50 mg/dL Final  . Calcium 09/11/2016 8.7  8.4 - 10.5 mg/dL Final  . GFR 09/11/2016 56.22* >60.00 mL/min Final    Allergies as of 09/14/2016      Reactions   Viagra [sildenafil Citrate]       Medication List       Accurate as of 09/14/16 12:21 PM. Always use your most recent med list.          amLODipine 5 MG tablet Commonly known as:  NORVASC TAKE 1 TABLET BY MOUTH EVERY DAY   aspirin 81 MG tablet Take 81 mg by mouth daily.   atorvastatin 20 MG tablet Commonly known as:  LIPITOR TAKE 1 TABLET BY MOUTH EVERY DAY   FREESTYLE LIBRE READER Devi 1 Device by Does not apply route continuous. [place] [id number] DX code E10.65   FREESTYLE LIBRE SENSOR SYSTEM Misc Apply 1 strip topically as directed. Apply 1 sensor every 10 days    DX code E10.65   furosemide 20 MG tablet Commonly known as:  LASIX TAKE 1 TABLET BY MOUTH EVERY DAY   glucose blood test strip Commonly known as:  BAYER CONTOUR NEXT TEST Use as instructed to check blood sugars 4 times per day dx code E10.65   HUMALOG 100 UNIT/ML injection Generic drug:  insulin lispro USE MAX 110 UNITS PER DAY WITH INSULIN PUMP   INVOKANA 300 MG Tabs tablet Generic drug:  canagliflozin TAKE 1 TABLET BY MOUTH EVERY DAY   PARoxetine 40 MG tablet Commonly known as:  PAXIL TAKE 1 TABLET (40 MG TOTAL) BY MOUTH AT BEDTIME.   testosterone 50 MG/5GM (1%) Gel Commonly known as:  ANDROGEL USE 1 AND 1/2 packets ONTO THE SKIN DAILY AS DIRECTED   valsartan 160 MG tablet Commonly known as:  DIOVAN TAKE 1 TABLET BY MOUTH DAILY.   Vitamin D 2000 units Caps Take by mouth. Takes 6000 daily       Allergies:  Allergies  Allergen Reactions  . Viagra [Sildenafil Citrate]     Past Medical History:  Diagnosis Date  . Carpal tunnel syndrome   . Depression   . Diabetes mellitus without complication ([place])   . ED (erectile dysfunction)     . Edema   . Hyperlipidemia   . Hypertension   . Lumbar disc disease   . Neuropathy   . Stroke [facility])     Past Surgical History:  Procedure Laterality Date  . BACK SURGERY    . VASECTOMY      Family History  Problem Relation Age of Onset  . Cancer Father  Prostate  . Diabetes Neg Hx     Social History:  reports that he has never smoked. He has never used smokeless tobacco. He reports that he does not drink alcohol or use drugs.  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   Currently on AndroGel 5 g packets, Using 1-1/2 packets daily He is is compliant with this his last level is normal   Lab Results  Component Value Date   TESTOSTERONE 392.70 06/06/2016     HYPERTENSION: Currently taking Valsartan 160 mg along with  5 mg amlodipine   His creatinine tends to be upper normal  With creatinine going up to 1.54 his Invokana was stopped and his creatinine is improving He also is cutting back on some of a higher protein foods in his diet   Lab Results  Component Value Date   CREATININE 1.34 09/11/2016   BUN 25 (H) 09/11/2016   NA 140 09/11/2016   K 4.0 09/11/2016   CL 106 09/11/2016   CO2 25 09/11/2016    Hypercholesterolemia: Well controlled on Lipitor 20 mg, tends to have low HDL   Lab Results  Component Value Date   CHOL 115 02/25/2016   HDL 37.70 (L) 02/25/2016   LDLCALC 58 02/25/2016   LDLDIRECT 67.0 08/02/2015   TRIG 96.0 02/25/2016   CHOLHDL 3 02/25/2016    Has history of retinopathy   EXAM:   BP 130/74   Pulse 68   Ht 5\' 8"  (1.727 m)   Wt 273 lb 3.2 oz (123.9 kg)   SpO2 97%   BMI 41.54 kg/m   1+ ankle edema present   ASSESSMENT/PLAN:  DIABETES and obesity:  See history of present illness for detailed discussion on current blood  sugar patterns and problems identified  Since his last visit he has markedly improved his diet and cut back on carbohydrates as well as higher fat meats which has improved his blood sugar control with blood sugars running fairly close to normal throughout the day  Recommendations:    He will look into the DexCom sensor and start this instead of [place]  He can try to have more variability in his diet and he can introduce higher protein dairy products and whole-grain foods and low-fat beats without high fat content  He needs to exercise  Continue Lasix once daily  Follow-up in 3 month  Discussed rotation of his infusion sites  He will continue to follow a healthy diet with smaller amounts of carbohydrates  Needs to check blood sugars at the time of boluses    HYPERTENSION: Blood pressure is Improved probably because of improved diet  Edema: Controlled with Lasix once daily.  Etiology not clear, has no proteinuria  RENAL dysfunction: His creatinine is improved with stopping Invokana; may have some degree of nephrosclerosis  Counseling time on subjects discussed in assessment and plan sections is over 50% of today's 25 minute visit     Patient Instructions  Call Dexcom  Exercise daily  Basal of 2.6 starts at 5.30 pm        [facility] 09/14/2016

## 2016-09-14 NOTE — Patient Instructions (Signed)
Call Dexcom  Exercise daily  Basal of 2.6 starts at 5.30 pm

## 2016-09-26 ENCOUNTER — Other Ambulatory Visit: Payer: Self-pay | Admitting: Endocrinology

## 2016-10-03 ENCOUNTER — Telehealth: Payer: Self-pay | Admitting: Endocrinology

## 2016-10-03 ENCOUNTER — Other Ambulatory Visit: Payer: Self-pay

## 2016-10-03 MED ORDER — OLMESARTAN MEDOXOMIL 20 MG PO TABS: ORAL_TABLET | ORAL | 2 refills | Status: DC

## 2016-10-03 NOTE — Telephone Encounter (Signed)
Patient is on valsartan (DIOVAN) 160 MG tablet and needs an alternative. Patient has been out of the medication for 3 weeks.  [id number] [address], [place] [phone] (Phone) [phone] (Fax)

## 2016-10-03 NOTE — Telephone Encounter (Signed)
Submitted

## 2016-10-03 NOTE — Telephone Encounter (Signed)
Please see message below and advise. Thank you.

## 2016-10-03 NOTE — Telephone Encounter (Signed)
Please change the prescription to Benicar 20 mg daily

## 2016-10-24 ENCOUNTER — Other Ambulatory Visit: Payer: Self-pay | Admitting: Endocrinology

## 2016-10-28 ENCOUNTER — Other Ambulatory Visit: Payer: Self-pay | Admitting: Endocrinology

## 2016-11-03 LAB — HM DIABETES EYE EXAM

## 2016-11-05 ENCOUNTER — Other Ambulatory Visit: Payer: Self-pay | Admitting: Endocrinology

## 2016-11-07 ENCOUNTER — Encounter: Payer: Self-pay | Admitting: Endocrinology

## 2016-11-28 ENCOUNTER — Other Ambulatory Visit: Payer: Self-pay | Admitting: Endocrinology

## 2016-12-04 ENCOUNTER — Other Ambulatory Visit: Payer: Self-pay | Admitting: Endocrinology

## 2016-12-06 ENCOUNTER — Ambulatory Visit (INDEPENDENT_AMBULATORY_CARE_PROVIDER_SITE_OTHER): Payer: Medicare Other | Admitting: Ophthalmology

## 2016-12-06 DIAGNOSIS — H35033 Hypertensive retinopathy, bilateral: Secondary | ICD-10-CM

## 2016-12-06 DIAGNOSIS — E10319 Type 1 diabetes mellitus with unspecified diabetic retinopathy without macular edema: Secondary | ICD-10-CM | POA: Diagnosis not present

## 2016-12-06 DIAGNOSIS — E103592 Type 1 diabetes mellitus with proliferative diabetic retinopathy without macular edema, left eye: Secondary | ICD-10-CM | POA: Diagnosis not present

## 2016-12-06 DIAGNOSIS — I10 Essential (primary) hypertension: Secondary | ICD-10-CM

## 2016-12-06 DIAGNOSIS — H43813 Vitreous degeneration, bilateral: Secondary | ICD-10-CM

## 2016-12-06 DIAGNOSIS — D3132 Benign neoplasm of left choroid: Secondary | ICD-10-CM | POA: Diagnosis not present

## 2016-12-06 DIAGNOSIS — E103391 Type 1 diabetes mellitus with moderate nonproliferative diabetic retinopathy without macular edema, right eye: Secondary | ICD-10-CM

## 2016-12-07 ENCOUNTER — Other Ambulatory Visit: Payer: Self-pay | Admitting: Endocrinology

## 2016-12-12 ENCOUNTER — Other Ambulatory Visit (INDEPENDENT_AMBULATORY_CARE_PROVIDER_SITE_OTHER): Payer: Medicare Other

## 2016-12-12 DIAGNOSIS — E1065 Type 1 diabetes mellitus with hyperglycemia: Secondary | ICD-10-CM | POA: Diagnosis not present

## 2016-12-12 DIAGNOSIS — E291 Testicular hypofunction: Secondary | ICD-10-CM

## 2016-12-12 LAB — COMPREHENSIVE METABOLIC PANEL
ALK PHOS: 71 U/L (ref 39–117)
ALT: 19 U/L (ref 0–53)
AST: 14 U/L (ref 0–37)
Albumin: 3.8 g/dL (ref 3.5–5.2)
BILIRUBIN TOTAL: 0.5 mg/dL (ref 0.2–1.2)
BUN: 24 mg/dL — AB (ref 6–23)
CO2: 25 mEq/L (ref 19–32)
Calcium: 9.1 mg/dL (ref 8.4–10.5)
Chloride: 106 mEq/L (ref 96–112)
Creatinine, Ser: 1.44 mg/dL (ref 0.40–1.50)
GFR: 51.7 mL/min — ABNORMAL LOW (ref >60.00)
GLUCOSE: 147 mg/dL — AB (ref 70–99)
Potassium: 4.1 mEq/L (ref 3.5–5.1)
SODIUM: 141 meq/L (ref 135–145)
TOTAL PROTEIN: 6.4 g/dL (ref 6.0–8.3)

## 2016-12-12 LAB — LIPID PANEL
CHOL/HDL RATIO: 4
Cholesterol: 127 mg/dL (ref 0–200)
HDL: 33 mg/dL — AB (ref >39.00)
LDL Cholesterol: 58 mg/dL (ref 0–99)
NONHDL: 94.29
Triglycerides: 183 mg/dL — ABNORMAL HIGH (ref 0.0–149.0)
VLDL: 36.6 mg/dL (ref 0.0–40.0)

## 2016-12-12 LAB — HEMOGLOBIN A1C: HEMOGLOBIN A1C: 8.4 % — AB (ref 4.6–6.5)

## 2016-12-12 LAB — TESTOSTERONE: Testosterone: 484.73 ng/dL (ref 300.00–890.00)

## 2016-12-14 ENCOUNTER — Encounter: Payer: Self-pay | Admitting: Endocrinology

## 2016-12-14 ENCOUNTER — Ambulatory Visit (INDEPENDENT_AMBULATORY_CARE_PROVIDER_SITE_OTHER): Payer: Medicare Other | Admitting: Endocrinology

## 2016-12-14 VITALS — BP 134/70 | HR 70 | Ht 68.0 in | Wt 276.6 lb

## 2016-12-14 DIAGNOSIS — Z23 Encounter for immunization: Secondary | ICD-10-CM

## 2016-12-14 DIAGNOSIS — I10 Essential (primary) hypertension: Secondary | ICD-10-CM | POA: Diagnosis not present

## 2016-12-14 DIAGNOSIS — N289 Disorder of kidney and ureter, unspecified: Secondary | ICD-10-CM

## 2016-12-14 DIAGNOSIS — E291 Testicular hypofunction: Secondary | ICD-10-CM | POA: Diagnosis not present

## 2016-12-14 DIAGNOSIS — E1065 Type 1 diabetes mellitus with hyperglycemia: Secondary | ICD-10-CM

## 2016-12-14 MED ORDER — METHYLPHENIDATE HCL ER (CD) 10 MG PO CPCR: 10.0000 mg | ORAL_CAPSULE | ORAL | 0 refills | Status: DC

## 2016-12-14 NOTE — Progress Notes (Signed)
Patient ID: Paul Adkins, male   DOB: 06/10/1945, 71 y.o.   MRN: [id number]    Reason for visit:   follow-up  Diagnosis: Type 1 diabetes, date of onset 1978  PAST history: He has had persistently poorly controlled diabetes for several years. A1c has been high with usual range 8.2 -9, overall improved since 2013. Compliance with glucose monitoring and diet has been variable and he does better when he is checking more sugars. His A1c was better than usual in 3/14 at 7.5 but he was also having significant hypoglycemia overnight and also occasionally later in the day. At that time he was started on Invokana 300 mg daily which helped him with glucose control and mild weight loss  CURRENT insulin pump brand:  Medtronic   PUMP SETTINGS are:   Basal rates: 2.25 from midnight- 4 AM. 4 AM = 2.  75.  8:30 pM = 2.6.  Boluses : 1 unit for 6 g carbs at mealtimes and blood sugar target 110-120, sensitivity 1:30 overnight, 1:25 until 8 PM and then 1:50 with active insulin 3 hours.  Changing infusion set every 3 days  Average total daily insulin = 115+ with 64 units basal units  RECENT history:   His A1c is still about the same at 8.4   Current blood sugar patterns and problems:  He was previously doing much better on his diet but is going back to eating large amounts of carbohydrates and snacks with higher blood sugars especially postprandial  He is checking his blood sugars are sporadically and mostly in the mornings  Usually not checking blood sugar BEFORE each meal and usually covering his carbohydrates only, sometimes not even entering his carbohydrate intake  Still not motivated to exercise as before and previously had used the exercise bike  Not clear if he did better when he was taking Invokana and also Victoza had not worked well with him previously  BOLUSES: He is doing several boluses throughout the day  However he thinks that his blood sugar tends to go up faster than the  bolus is working, currently his preferred insulin on the insurance his Humalog  He now says that his blood sugars are very high, about 250 or more he will take insulin with a injection as much as 20 units since his correction factor does not appear to work especially late at night when his blood sugars are high    Changing infusion set every 3-4 days  GLUCOSE readings: Reviewed for the last 2 weeks from pump download:  Mean values apply above for all meters except median for One Touch  PRE-MEAL Fasting Lunch Dinner Bedtime Overall  Glucose range:  99-256  6 6-2 54   234-2 65  195-304  190   Mean/median:        Previous readings:  Mean values apply above for all meters except median for One Touch  PRE-MEAL Fasting Lunch Dinner Bedtime Overall  Glucose range:  83-376     81-236    Mean/median: 157 118 103 156 154     Physical activity: none, previously was exercising on the bike   Wt Readings from Last 3 Encounters:  12/14/16 276 lb 9.6 oz (125.5 kg)  09/14/16 273 lb 3.2 oz (123.9 kg)  08/08/16 274 lb 6.4 oz (124.5 kg)    Lab Results  Component Value Date   HGBA1C 8.4 (H) 12/12/2016   HGBA1C 8.4 (H) 08/03/2016   HGBA1C 8.3 (H) 06/06/2016   Lab Results  Component Value Date   MICROALBUR 0.7 08/03/2016   LDLCALC 58 12/12/2016   CREATININE 1.44 12/12/2016    OTHER problems addressed today are in review of systems    Lab on 12/12/2016  Component Date Value Ref Range Status  . Hgb A1c MFr Bld 12/12/2016 8.4* 4.6 - 6.5 % Final   Glycemic Control Guidelines for People with Diabetes:Non Diabetic:  <6%Goal of Therapy: <7%Additional Action Suggested:  >8%   . Sodium 12/12/2016 141  135 - 145 mEq/L Final  . Potassium 12/12/2016 4.1  3.5 - 5.1 mEq/L Final  . Chloride 12/12/2016 106  96 - 112 mEq/L Final  . CO2 12/12/2016 25  19 - 32 mEq/L Final  . Glucose, Bld 12/12/2016 147* 70 - 99 mg/dL Final  . BUN 12/12/2016 24* 6 - 23 mg/dL Final  . Creatinine, Ser 12/12/2016 1.44   0.40 - 1.50 mg/dL Final  . Total Bilirubin 12/12/2016 0.5  0.2 - 1.2 mg/dL Final  . Alkaline Phosphatase 12/12/2016 71  39 - 117 U/L Final  . AST 12/12/2016 14  0 - 37 U/L Final  . ALT 12/12/2016 19  0 - 53 U/L Final  . Total Protein 12/12/2016 6.4  6.0 - 8.3 g/dL Final  . Albumin 12/12/2016 3.8  3.5 - 5.2 g/dL Final  . Calcium 12/12/2016 9.1  8.4 - 10.5 mg/dL Final  . GFR 12/12/2016 51.70* >60.00 mL/min Final  . Testosterone 12/12/2016 484.73  300.00 - 890.00 ng/dL Final  . Cholesterol 12/12/2016 127  0 - 200 mg/dL Final   ATP III Classification       Desirable:  < 200 mg/dL               Borderline High:  200 - 239 mg/dL          High:  > = 240 mg/dL  . Triglycerides 12/12/2016 183.0* 0.0 - 149.0 mg/dL Final   Normal:  <150 mg/dLBorderline High:  150 - 199 mg/dL  . HDL 12/12/2016 33.00* >39.00 mg/dL Final  . VLDL 12/12/2016 36.6  0.0 - 40.0 mg/dL Final  . LDL Cholesterol 12/12/2016 58  0 - 99 mg/dL Final  . Total CHOL/HDL Ratio 12/12/2016 4   Final                  Men          Women1/2 Average Risk     3.4          3.3Average Risk          5.0          4.42X Average Risk          9.6          7.13X Average Risk          15.0          11.0                      . NonHDL 12/12/2016 94.29   Final   NOTE:  Non-HDL goal should be 30 mg/dL higher than patient's LDL goal (i.e. LDL goal of < 70 mg/dL, would have non-HDL goal of < 100 mg/dL)    Allergies as of 12/14/2016      Reactions   Viagra [sildenafil Citrate]       Medication List       Accurate as of 12/14/16 10:40 AM. Always use your most recent med list.  amLODipine 5 MG tablet Commonly known as:  NORVASC TAKE 1 TABLET BY MOUTH EVERY DAY   aspirin 81 MG tablet Take 81 mg by mouth daily.   atorvastatin 20 MG tablet Commonly known as:  LIPITOR TAKE 1 TABLET BY MOUTH EVERY DAY   FREESTYLE LIBRE READER Devi 1 Device by Does not apply route continuous. [place] [id number] DX code E10.65   FREESTYLE LIBRE SENSOR  SYSTEM Misc Apply 1 strip topically as directed. Apply 1 sensor every 10 days    DX code E10.65   furosemide 20 MG tablet Commonly known as:  LASIX TAKE 1 TABLET BY MOUTH EVERY DAY   glucose blood test strip Commonly known as:  BAYER CONTOUR NEXT TEST Use as instructed to check blood sugars 4 times per day dx code E10.65   HUMALOG 100 UNIT/ML injection Generic drug:  insulin lispro USE MAX 110 UNITS PER DAY WITH INSULIN PUMP   olmesartan 20 MG tablet Commonly known as:  BENICAR Take one tablet daily   PARoxetine 40 MG tablet Commonly known as:  PAXIL TAKE 1 TABLET (40 MG TOTAL) BY MOUTH AT BEDTIME.   testosterone 50 MG/5GM (1%) Gel Commonly known as:  ANDROGEL APPLY 1 AND 1/2 PACKET ONTO THE SKIN DAILY   Vitamin D 2000 units Caps Take by mouth. Takes 6000 daily       Allergies:  Allergies  Allergen Reactions  . Viagra [Sildenafil Citrate]     Past Medical History:  Diagnosis Date  . Carpal tunnel syndrome   . Depression   . Diabetes mellitus without complication ([place])   . ED (erectile dysfunction)   . Edema   . Hyperlipidemia   . Hypertension   . Lumbar disc disease   . Neuropathy   . Stroke [facility])     Past Surgical History:  Procedure Laterality Date  . BACK SURGERY    . VASECTOMY      Family History  Problem Relation Age of Onset  . Cancer Father        Prostate  . Diabetes Neg Hx     Social History:  reports that he has never smoked. He has never used smokeless tobacco. He reports that he does not drink alcohol or use drugs.  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   Currently on AndroGel 5 g packets, Using 1-1/2 packets daily He is is compliant with this his  level is normal   Lab Results  Component Value Date   TESTOSTERONE 484.73 12/12/2016     HYPERTENSION: Controlled with taking Benicar 20 mg along with  5 mg amlodipine   His creatinine tends to be upper normal  With creatinine going up to 1.54 his Invokana was stopped and his creatinine is slightly better but still upper normal   Lab Results  Component Value Date   CREATININE 1.44 12/12/2016   BUN 24 (H) 12/12/2016   NA 141 12/12/2016   K 4.1 12/12/2016   CL 106 12/12/2016   CO2 25 12/12/2016    Hypercholesterolemia: Well controlled on Lipitor 20 mg, tends to have low HDL   Lab Results  Component Value  Date   CHOL 127 12/12/2016   HDL 33.00 (L) 12/12/2016   LDLCALC 58 12/12/2016   LDLDIRECT 67.0 08/02/2015   TRIG 183.0 (H) 12/12/2016   CHOLHDL 4 12/12/2016    Has history of retinopathy   EXAM:   BP 134/70   Pulse 70   Ht 5\' 8"  (1.727 m)   Wt 276 lb 9.6 oz (125.5 kg)   SpO2 97%   BMI 42.06 kg/m     ASSESSMENT/PLAN:  DIABETES Type I and obesity:  See history of present illness for detailed discussion on current blood sugar patterns and problems identified  His A1c is still high at 8.4 He has gone back to his eating more carbohydrates, frequent meals and snacks in the evenings and generally higher readings in the evenings and overnight Monitoring very infrequently Previously [place] sensor was not giving him accurate readings He still did not look into the DexCom sensor as requested on his last visit Again discussed the benefits of using CGM especially with the readings being on his phone and alarms for high reading values Can also do better with the regular exercise  Recommendations:    He will look into the DexCom sensor and given him the brochure  He can try to cut back on his portions and carbohydrates  He needs to exercise, does exercise  bike  Follow-up in 3 month  Needs to check blood sugars at the time of boluses  Sensitivity to be 1:20 except between 7:30 AM and 5 PM    HYPERTENSION: Blood pressure is controlled Hypogonadism: Adequately replaced  ADD: He agrees to go back to Ritalin every other day which was helping him focus and probably will motivate him better to focus on his diabetes and self care  Edema: Controlled with Lasix once daily.    RENAL dysfunction: His creatinine is  upper normal He will continue ARB drug  Influenza vaccine given  Counseling time on subjects discussed in assessment and plan sections is over 50% of today's 25 minute visit     There are no Patient Instructions on file for this visit.     [facility] 12/14/2016

## 2016-12-26 ENCOUNTER — Other Ambulatory Visit: Payer: Self-pay | Admitting: Endocrinology

## 2016-12-27 ENCOUNTER — Other Ambulatory Visit: Payer: Self-pay | Admitting: Endocrinology

## 2017-02-06 ENCOUNTER — Other Ambulatory Visit: Payer: Self-pay | Admitting: Endocrinology

## 2017-02-08 ENCOUNTER — Telehealth: Payer: Self-pay | Admitting: Endocrinology

## 2017-02-08 ENCOUNTER — Other Ambulatory Visit: Payer: Self-pay | Admitting: Endocrinology

## 2017-02-08 MED ORDER — METHYLPHENIDATE HCL ER (CD) 10 MG PO CPCR: 10.0000 mg | ORAL_CAPSULE | ORAL | 0 refills | Status: DC

## 2017-02-08 NOTE — Telephone Encounter (Signed)
Please call patient to advise re: status of letter he requested from Dr. Dwyane Dee regarding his health/diagnosis/inabilty for jury duty  Ph# [phone]

## 2017-02-08 NOTE — Telephone Encounter (Signed)
There is a letter on your desk, not sure if this is the letter you need?  Please advise.

## 2017-02-08 NOTE — Telephone Encounter (Signed)
Should be able to get his letter ready by the first of next week

## 2017-02-08 NOTE — Telephone Encounter (Signed)
Called patient and let him know that we will call him as soon as this letter is ready so he can come and pick it up.

## 2017-02-09 ENCOUNTER — Other Ambulatory Visit: Payer: Self-pay

## 2017-02-09 MED ORDER — METHYLPHENIDATE HCL ER (CD) 10 MG PO CPCR: 10.0000 mg | ORAL_CAPSULE | ORAL | 0 refills | Status: DC

## 2017-02-18 ENCOUNTER — Encounter: Payer: Self-pay | Admitting: Endocrinology

## 2017-02-23 ENCOUNTER — Telehealth: Payer: Self-pay | Admitting: Endocrinology

## 2017-02-23 NOTE — Telephone Encounter (Signed)
Called patient and let him know that his paperwork is ready and he is coming on Monday morning to pick it up on New Years Eve. I have placed this letter up front in the file folder for him to pick up.

## 2017-02-23 NOTE — Telephone Encounter (Signed)
Pt called about something that was being mailed out to him, stated he didn't receive it   Please advise!

## 2017-03-13 ENCOUNTER — Other Ambulatory Visit (INDEPENDENT_AMBULATORY_CARE_PROVIDER_SITE_OTHER): Payer: Medicare Other

## 2017-03-13 DIAGNOSIS — E1065 Type 1 diabetes mellitus with hyperglycemia: Secondary | ICD-10-CM

## 2017-03-13 LAB — BASIC METABOLIC PANEL
BUN: 22 mg/dL (ref 6–23)
CALCIUM: 8.9 mg/dL (ref 8.4–10.5)
CO2: 29 meq/L (ref 19–32)
Chloride: 105 mEq/L (ref 96–112)
Creatinine, Ser: 1.31 mg/dL (ref 0.40–1.50)
GFR: 57.62 mL/min — ABNORMAL LOW (ref >60.00)
GLUCOSE: 132 mg/dL — AB (ref 70–99)
Potassium: 4.2 mEq/L (ref 3.5–5.1)
SODIUM: 140 meq/L (ref 135–145)

## 2017-03-13 LAB — HEMOGLOBIN A1C: Hgb A1c MFr Bld: 9 % — ABNORMAL HIGH (ref 4.6–6.5)

## 2017-03-15 NOTE — Progress Notes (Signed)
Patient ID: Paul Adkins, male   DOB: [birth year], 72 y.o.   MRN: [id number]    Reason for visit:   follow-up  Diagnosis: Type 1 diabetes, date of onset 1978  PAST history: He has had persistently poorly controlled diabetes for several years. A1c has been high with usual range 8.2 -9, overall improved since 2013. Compliance with glucose monitoring and diet has been variable and he does better when he is checking more sugars. His A1c was better than usual in 3/14 at 7.5 but he was also having significant hypoglycemia overnight and also occasionally later in the day. At that time he was started on Invokana 300 mg daily which helped him with glucose control and mild weight loss  CURRENT insulin pump brand:  Medtronic   PUMP SETTINGS are:   Basal rates: 2.25 from midnight- 4 AM. 4 AM = 2.  75.  8:30 pM = 2.6.  Boluses : 1 unit for 6 g carbs at mealtimes and blood sugar target 110-120, sensitivity 1: 20 overnight, 1:25 until 8 PM and then 1: 20 with active insulin 3 hours.  Changing infusion set every 3 days  Average total daily insulin = 115+ with 64 units basal units  RECENT history:   His A1c is higher at 9% and has been consistently over 8%  Current blood sugar patterns and problems:  He does not have significant amount of blood sugar readings in his pump with readings available mostly overnight and mornings  Despite reminders he does not check his sugars while she is bolusing especially at suppertime.  He was told to start using the DexCom sensor a couple of times but he still has not investigated this and is concerned about the cost  Previously had falsely low readings with a FreeStyle libre sensor  He saying that he is having some problems with his infusion sites periodically causing high blood sugars  Also will sometimes take insulin with an injection instead of the bolus for better absorption  On his last visit his sensitivity was changed to  allow larger amount of  correction doses  However he is sometimes reusing his infusion set and cartridge and did have an episode of an infection once   He is gradually gaining weight  Still not controlling his carbohydrate intake and portions, at times he has had as much is 320 g of carbohydrate in a day  Still not motivated to exercise as before and previously had used the exercise bike  Not clear if he did better when he was taking Invokana and also Victoza had not worked well with him previously  BOLUSES: He is doing several boluses throughout the day    Changing infusion set every 3-4 days  GLUCOSE readings: Reviewed for the last 2 weeks from pump download:  Mean values apply above for all meters except median for One Touch  PRE-MEAL Fasting Lunch Dinner Overnight  Overall  Glucose range: 106-252  99-232  132  196-400+    Mean/median:     187   POST-MEAL PC Breakfast PC Lunch PC Dinner  Glucose range:   144, 145   Mean/median:       Physical activity: none, previously was exercising on the bike   Wt Readings from Last 3 Encounters:  03/16/17 280 lb (127 kg)  12/14/16 276 lb 9.6 oz (125.5 kg)  09/14/16 273 lb 3.2 oz (123.9 kg)    Lab Results  Component Value Date   HGBA1C 9.0 (H) 03/13/2017  HGBA1C 8.4 (H) 12/12/2016   HGBA1C 8.4 (H) 08/03/2016   Lab Results  Component Value Date   MICROALBUR 0.7 08/03/2016   LDLCALC 58 12/12/2016   CREATININE 1.31 03/13/2017    OTHER problems addressed today are in review of systems    Lab on 03/13/2017  Component Date Value Ref Range Status  . Sodium 03/13/2017 140  135 - 145 mEq/L Final  . Potassium 03/13/2017 4.2  3.5 - 5.1 mEq/L Final  . Chloride 03/13/2017 105  96 - 112 mEq/L Final  . CO2 03/13/2017 29  19 - 32 mEq/L Final  . Glucose, Bld 03/13/2017 132* 70 - 99 mg/dL Final  . BUN 03/13/2017 22  6 - 23 mg/dL Final  . Creatinine, Ser 03/13/2017 1.31  0.40 - 1.50 mg/dL Final  . Calcium 03/13/2017 8.9  8.4 - 10.5 mg/dL Final  . GFR  03/13/2017 57.62* >60.00 mL/min Final  . Hgb A1c MFr Bld 03/13/2017 9.0* 4.6 - 6.5 % Final   Glycemic Control Guidelines for People with Diabetes:Non Diabetic:  <6%Goal of Therapy: <7%Additional Action Suggested:  >8%     Allergies as of 03/16/2017      Reactions   Viagra [sildenafil Citrate]       Medication List        Accurate as of 03/16/17  8:39 AM. Always use your most recent med list.          amLODipine 5 MG tablet Commonly known as:  NORVASC TAKE 1 TABLET BY MOUTH EVERY DAY   aspirin 81 MG tablet Take 81 mg by mouth daily.   atorvastatin 20 MG tablet Commonly known as:  LIPITOR TAKE 1 TABLET BY MOUTH EVERY DAY   FREESTYLE LIBRE READER Devi 1 Device by Does not apply route continuous. [place] [id number] DX code E10.65   FREESTYLE LIBRE SENSOR SYSTEM Misc Apply 1 strip topically as directed. Apply 1 sensor every 10 days    DX code E10.65   furosemide 20 MG tablet Commonly known as:  LASIX TAKE 1 TABLET BY MOUTH EVERY DAY   glucose blood test strip Commonly known as:  BAYER CONTOUR NEXT TEST Use as instructed to check blood sugars 4 times per day dx code E10.65   HUMALOG 100 UNIT/ML injection Generic drug:  insulin lispro USE MAX 110 UNITS PER DAY WITH INSULIN PUMP   methylphenidate 10 MG CR capsule Commonly known as:  METADATE CD Take 1 capsule (10 mg total) by mouth every morning.   olmesartan 20 MG tablet Commonly known as:  BENICAR TAKE 1 TABLET BY MOUTH EVERY DAY   PARoxetine 40 MG tablet Commonly known as:  PAXIL TAKE 1 TABLET (40 MG TOTAL) BY MOUTH AT BEDTIME.   testosterone 50 MG/5GM (1%) Gel Commonly known as:  ANDROGEL APPLY 1 AND 1/2 PACKET ONTO THE SKIN DAILY   Vitamin D 2000 units Caps Take by mouth. Takes 6000 daily       Allergies:  Allergies  Allergen Reactions  . Viagra [Sildenafil Citrate]     Past Medical History:  Diagnosis Date  . Carpal tunnel syndrome   . Depression   . Diabetes mellitus without complication  ([place])   . ED (erectile dysfunction)   . Edema   . Hyperlipidemia   . Hypertension   . Lumbar disc disease   . Neuropathy   . Stroke [facility])     Past Surgical History:  Procedure Laterality Date  . BACK SURGERY    . VASECTOMY  Family History  Problem Relation Age of Onset  . Cancer Father        Prostate  . Diabetes Neg Hx     Social History:  reports that  has never smoked. he has never used smokeless tobacco. He reports that he does not drink alcohol or use drugs.  REVIEW of systems:  He has the following additional problems addressed today   Muscle aches: He says that his arms feel achy but not clearly week.  Does not have any severe pain or difficulty with movement.  No neck pain.  No aching in his thigh muscles or leg weakness.  No headaches  He has been on vitamin D supplementation post last few years and on his own has been taking 6000 units daily, no recent labs available  He says he feels sleepy and may sleep up to 14 hours.  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   Currently on AndroGel 5 g packets, Using 1-1/2 packets daily He is is compliant with this his last level is normal   Lab Results  Component Value Date   TESTOSTERONE 484.73 12/12/2016     HYPERTENSION: Controlled with taking Benicar 20 mg along with  5 mg amlodipine   His creatinine tends to be upper normal  With creatinine going up to 1.54 his  Invokana was stopped and his creatinine has been improving   Lab Results  Component Value Date   CREATININE 1.31 03/13/2017   CREATININE 1.44 12/12/2016   CREATININE 1.34 09/11/2016     Hypercholesterolemia: Well controlled on Lipitor 20 mg, tends to have low HDL   Lab Results  Component Value Date   CHOL 127 12/12/2016   HDL 33.00 (L) 12/12/2016   LDLCALC 58 12/12/2016   LDLDIRECT 67.0 08/02/2015   TRIG 183.0 (H) 12/12/2016   CHOLHDL 4 12/12/2016    Has history of retinopathy, has follow-up appointment scheduled   EXAM:   BP (!) 142/74   Pulse 74   Ht '5\' 8"'$  (1.727 m)   Wt 280 lb (127 kg)   SpO2 97%   BMI 42.57 kg/m     ASSESSMENT/PLAN:  DIABETES Type I and obesity:  See history of present illness for detailed discussion on current blood sugar patterns and problems identified  His A1c is higher at 9%  Although some of his issues are related to inconsistent diet and overeating along with lack of exercise he things he is having some issues with his infusion sets not working in some areas of his abdomen He still has not looked into the DexCom sensor despite reminders on the last 2 visits Unable to improve his diet compliance even with talking to the psychologist on a monthly basis  Although he had inconsistent benefit with Invokana he may benefit from this again as a trial since he is also tending to gain weight and also has a history of  edema requiring Lasix  Recommendations:    He will look into the DexCom sensor and he will call to check on the coverage  He can try to cut back on his food portions and carbohydrates  He needs to exercise, does have exercise bike available at home  He will check his blood sugars more often in the meantime and also bring his meter for download on the next visit  He will rotate his infusion sites and try to use more of the anterior part of the abdomen away from the midline  Follow-up in 6 weeks Invokana 100 mg daily to be  started   HYPERTENSION: Blood pressure is controlled Although blood pressure is likely to improve with adding Invokana would like changes in medications as yet since systolic reading is still relatively high  Upper extremity myalgia: Will rule out PMR with ESR testing today Also because of his symptoms of excessive sleepiness will check thyroid levels  However otherwise he needs to be seen by his pulmonologist which has been scheduled for sleep apnea  History of vitamin D deficiency: He is taking a large amount of D3 supplement for some time and needs follow-up level  ADD: Benefiting from Ritalin which he takes as needed  Edema: Controlled with Lasix once daily.  He will take this every other day when he starts Invokana  RENAL dysfunction: His creatinine is slightly better Will need follow-up after starting Invokana in 6 weeks    Counseling time on subjects discussed in assessment and plan sections is over 50% of today's 25 minute visit     There are no Patient Instructions on file for this visit.     Elayne Snare 03/16/2017

## 2017-03-16 ENCOUNTER — Other Ambulatory Visit (INDEPENDENT_AMBULATORY_CARE_PROVIDER_SITE_OTHER): Payer: Medicare Other

## 2017-03-16 ENCOUNTER — Encounter: Payer: Self-pay | Admitting: Endocrinology

## 2017-03-16 ENCOUNTER — Ambulatory Visit: Payer: Medicare Other | Admitting: Endocrinology

## 2017-03-16 VITALS — BP 142/74 | HR 74 | Ht 68.0 in | Wt 280.0 lb

## 2017-03-16 DIAGNOSIS — R5383 Other fatigue: Secondary | ICD-10-CM

## 2017-03-16 DIAGNOSIS — I10 Essential (primary) hypertension: Secondary | ICD-10-CM | POA: Diagnosis not present

## 2017-03-16 DIAGNOSIS — M791 Myalgia, unspecified site: Secondary | ICD-10-CM

## 2017-03-16 DIAGNOSIS — E559 Vitamin D deficiency, unspecified: Secondary | ICD-10-CM | POA: Diagnosis not present

## 2017-03-16 DIAGNOSIS — E1065 Type 1 diabetes mellitus with hyperglycemia: Secondary | ICD-10-CM

## 2017-03-16 LAB — T4, FREE: Free T4: 0.73 ng/dL (ref 0.60–1.60)

## 2017-03-16 LAB — SEDIMENTATION RATE: Sed Rate: 7 mm/hr (ref 0–20)

## 2017-03-16 LAB — VITAMIN D 25 HYDROXY (VIT D DEFICIENCY, FRACTURES): VITD: 57.15 ng/mL (ref 30.00–100.00)

## 2017-03-16 LAB — TSH: TSH: 3.06 u[IU]/mL (ref 0.35–4.50)

## 2017-03-16 MED ORDER — CANAGLIFLOZIN 100 MG PO TABS: ORAL_TABLET | ORAL | 3 refills | Status: DC

## 2017-03-16 NOTE — Patient Instructions (Addendum)
Exercise bike   Inject in front of stomach  Lasix on 2nd  Day with Invokana  More testing

## 2017-03-22 ENCOUNTER — Other Ambulatory Visit: Payer: Self-pay | Admitting: Endocrinology

## 2017-04-20 ENCOUNTER — Ambulatory Visit: Payer: Medicare Other | Admitting: Pulmonary Disease

## 2017-04-20 ENCOUNTER — Encounter: Payer: Self-pay | Admitting: Pulmonary Disease

## 2017-04-20 VITALS — BP 138/82 | HR 73 | Ht 67.0 in | Wt 278.4 lb

## 2017-04-20 DIAGNOSIS — G4733 Obstructive sleep apnea (adult) (pediatric): Secondary | ICD-10-CM

## 2017-04-20 DIAGNOSIS — Z6841 Body Mass Index (BMI) 40.0 and over, adult: Secondary | ICD-10-CM

## 2017-04-20 NOTE — Progress Notes (Signed)
   Subjective:    Patient ID: Paul Adkins, male    DOB: October 01, 1945, 72 y.o.   MRN: [id number]  HPI    Review of Systems  Constitutional: Negative for fever and unexpected weight change.  HENT: Negative for congestion, dental problem, ear pain, nosebleeds, postnasal drip, rhinorrhea, sinus pressure, sneezing, sore throat and trouble swallowing.   Eyes: Negative for redness and itching.  Respiratory: Negative for cough, chest tightness, shortness of breath and wheezing.   Cardiovascular: Positive for leg swelling. Negative for palpitations.  Gastrointestinal: Negative for nausea and vomiting.  Genitourinary: Negative for dysuria.  Musculoskeletal: Negative for joint swelling.  Skin: Negative for rash.  Allergic/Immunologic: Negative.  Negative for environmental allergies, food allergies and immunocompromised state.  Neurological: Negative for headaches.  Hematological: Does not bruise/bleed easily.  Psychiatric/Behavioral: Negative for dysphoric mood. The patient is not nervous/anxious.        Objective:   Physical Exam        Assessment & Plan:

## 2017-04-20 NOTE — Progress Notes (Signed)
[facility]  Chief Complaint  Patient presents with  . Sleep Consult    referred by Dr Dwyane Dee, has OSA,had sleep study 8 years ago,needs new supplies,possibly new machine    Vital signs: BP 138/82 (BP Location: Left Arm, Cuff Size: Normal)   Pulse 73   Ht 5\' 7"  (1.702 m)   Wt 278 lb 6.4 oz (126.3 kg)   SpO2 100%   BMI 43.60 kg/m   History of Present Illness: Paul Adkins is a 72 y.o. male for evaluation of sleep problems.  He has history of sleep apnea.  He reports seeing Dr. Gwenette Greet years ago.  He has been using [facility] for his DME.  His machine is ancient.  He was told by his DME that he need follow up to get new supplies.  He travels several times per year.  He can't sleep w/o CPAP.  If he does, then he will snore and wake up feeling choked.  He goes to sleep between 8 and 10 pm.  He falls asleep after 15 minutes.  He wakes up 1 or 2 times to use the bathroom.  He gets out of bed at 8 am.  He feels okay in the morning when he uses CPAP.  He denies morning headache.  He does not use anything to help him fall sleep or stay awake.  He denies sleep walking, sleep talking, bruxism, or nightmares.  There is no history of restless legs.  He denies sleep hallucinations, sleep paralysis, or cataplexy.  The Epworth score is 8 out of 24.   Physical Exam:  General - pleasant Eyes - pupils reactive ENT - no sinus tenderness, no oral exudate, no LAN, MP 3 Cardiac - regular, no murmur Chest - no wheeze, rales Abd - soft, non tender Ext - no edema Skin - no rashes Neuro - normal strength Psych - normal mood  Discussion: He has history of sleep apnea and has been on CPAP.  His machine is ancient.  He is also in need of new supplies.  He travels frequently.  We discussed how sleep apnea can affect various health problems, including risks for hypertension, cardiovascular disease, and diabetes.  We also discussed how sleep disruption  can increase risks for accidents, such as while driving.  Weight loss as a means of improving sleep apnea was also reviewed.  Additional treatment options discussed were CPAP therapy, oral appliance, and surgical intervention.  Assessment/Plan:  Obstructive sleep apnea. - will arrange for home sleep study to assess current status of sleep apnea - will then arrange for new CPAP machine - explained that it can be difficult at times to get approval from insurance for travel machines  Obesity. - discussed importance of weight loss   Patient Instructions  Will arrange for home sleep study Will call to arrange for follow up after sleep study reviewed    Chesley Mires, MD [place] 04/20/2017, 3:12 PM Pager:  [phone]  Flow Sheet  Sleep tests:  Review of Systems: Constitutional: Negative for fever and unexpected weight change.  HENT: Negative for congestion, dental problem, ear pain, nosebleeds, postnasal drip, rhinorrhea, sinus pressure, sneezing, sore throat and trouble swallowing.   Eyes: Negative for redness and itching.  Respiratory: Negative for cough, chest tightness, shortness of breath and wheezing.   Cardiovascular: Positive for leg swelling. Negative for palpitations.  Gastrointestinal: Negative for nausea and vomiting.  Genitourinary: Negative for dysuria.  Musculoskeletal: Negative for joint swelling.  Skin: Negative for rash.  Allergic/Immunologic: Negative.  Negative for environmental allergies, food allergies and immunocompromised state.  Neurological: Negative for headaches.  Hematological: Does not bruise/bleed easily.  Psychiatric/Behavioral: Negative for dysphoric mood. The patient is not nervous/anxious.    Past Medical History: He  has a past medical history of Carpal tunnel syndrome, Depression, Diabetes mellitus without complication ([place]), ED (erectile dysfunction), Edema, Hyperlipidemia, Hypertension, Lumbar disc disease, Neuropathy,  and Stroke ([place]).  Past Surgical History: He  has a past surgical history that includes Back surgery and Vasectomy.  Family History: His family history includes Cancer in his father.  Social History: He  reports that  has never smoked. he has never used smokeless tobacco. He reports that he does not drink alcohol or use drugs.  Medications: Allergies as of 04/20/2017      Reactions   Viagra [sildenafil Citrate]       Medication List        Accurate as of 04/20/17  3:12 PM. Always use your most recent med list.          amLODipine 5 MG tablet Commonly known as:  NORVASC TAKE 1 TABLET BY MOUTH EVERY DAY   aspirin 81 MG tablet Take 81 mg by mouth daily.   atorvastatin 20 MG tablet Commonly known as:  LIPITOR TAKE 1 TABLET BY MOUTH EVERY DAY   canagliflozin 100 MG Tabs tablet Commonly known as:  INVOKANA 1 tablet before breakfast   FREESTYLE LIBRE READER Devi 1 Device by Does not apply route continuous. [place] [id number] DX code E10.65   FREESTYLE LIBRE SENSOR SYSTEM Misc Apply 1 strip topically as directed. Apply 1 sensor every 10 days    DX code E10.65   furosemide 20 MG tablet Commonly known as:  LASIX TAKE 1 TABLET BY MOUTH EVERY DAY   glucose blood test strip Commonly known as:  BAYER CONTOUR NEXT TEST Use as instructed to check blood sugars 4 times per day dx code E10.65   HUMALOG 100 UNIT/ML injection Generic drug:  insulin lispro USE MAX 110 UNITS PER DAY WITH INSULIN PUMP   methylphenidate 10 MG CR capsule Commonly known as:  METADATE CD Take 1 capsule (10 mg total) by mouth every morning.   olmesartan 20 MG tablet Commonly known as:  BENICAR TAKE 1 TABLET BY MOUTH EVERY DAY   PARoxetine 40 MG tablet Commonly known as:  PAXIL TAKE 1 TABLET (40 MG TOTAL) BY MOUTH AT BEDTIME.   testosterone 50 MG/5GM (1%) Gel Commonly known as:  ANDROGEL APPLY 1 AND 1/2 PACKET ONTO THE SKIN DAILY   Vitamin D 2000 units Caps Take by mouth. Takes 6000  daily

## 2017-04-20 NOTE — Patient Instructions (Signed)
Will arrange for home sleep study Will call to arrange for follow up after sleep study reviewed  

## 2017-04-25 ENCOUNTER — Other Ambulatory Visit (INDEPENDENT_AMBULATORY_CARE_PROVIDER_SITE_OTHER): Payer: Medicare Other

## 2017-04-25 DIAGNOSIS — E1065 Type 1 diabetes mellitus with hyperglycemia: Secondary | ICD-10-CM

## 2017-04-25 LAB — BASIC METABOLIC PANEL
BUN: 25 mg/dL — AB (ref 6–23)
CHLORIDE: 108 meq/L (ref 96–112)
CO2: 24 meq/L (ref 19–32)
CREATININE: 1.44 mg/dL (ref 0.40–1.50)
Calcium: 8.8 mg/dL (ref 8.4–10.5)
GFR: 51.64 mL/min — ABNORMAL LOW (ref >60.00)
Glucose, Bld: 97 mg/dL (ref 70–99)
Potassium: 3.9 mEq/L (ref 3.5–5.1)
Sodium: 141 mEq/L (ref 135–145)

## 2017-04-26 NOTE — Progress Notes (Signed)
Patient ID: Paul Adkins, male   DOB: 12-31-1945, 72 y.o.   MRN: [id number]    Reason for visit:   follow-up  Diagnosis: Type 1 diabetes, date of onset 1978  PAST history: He has had persistently poorly controlled diabetes for several years. A1c has been high with usual range 8.2 -9, overall improved since 2013. Compliance with glucose monitoring and diet has been variable and he does better when he is checking more sugars. His A1c was better than usual in 3/14 at 7.5 but he was also having significant hypoglycemia overnight and also occasionally later in the day. At that time he was started on Invokana 300 mg daily which helped him with glucose control and mild weight loss  CURRENT insulin pump brand:  Medtronic   PUMP SETTINGS are:   Basal rates: 2.25 from midnight- 4 AM. 4 AM = 2.  75.  8:30 pM = 2.6.  Boluses : 1 unit for 6 g carbs at mealtimes and blood sugar target 110-120, sensitivity 1: 20 overnight, 1:25 until 8 PM and then 1: 20 with active insulin 3 hours.  Changing infusion set every 3 days  Average total daily insulin = 115+ with 64 units basal units  RECENT history:   His A1c is higher at 9% and has been consistently over 8%  Current blood sugar patterns and problems:  He does have difficulty with communicating his pump and meter and has minimal blood sugars in his pump  However even review of his glucose monitor indicates checking blood sugars mostly in the mornings and some overnight  He says that his blood sugars are higher at night and he will usually break up with symptoms of hyperglycemia and take extra boluses  With [facility] he appears to have somewhat better fasting readings compared to last time and has lost 2 pounds  He does not check his blood sugars after evening meal except once or twice with variable readings  He is not tending to eat large snacks late at night recently because he goes to bed a little earlier  Because of hip pain he has not  been able to ambulate much or do any exercise  MEALTIME boluses: He is not entering his blood sugars at the time of boluses to make a correction  Since he does not do enough readings after meals difficult to assess his carbohydrate coverage although blood sugars are usually not higher at noon time; however not always eating breakfast in the morning also    Changing infusion set every 3-4 days  GLUCOSE readings: Reviewed for the last 2 weeks from download:  Mean values apply above for all meters except median for One Touch  PRE-MEAL Fasting Lunch Dinner Overnight  Overall  Glucose range: 93-234  137, 157   172-308    Mean/median: ?  150    241  177    POST-MEAL PC Breakfast PC Lunch PC Dinner  Glucose range:  128, 171    Mean/median:      Previous blood sugars:  Mean values apply above for all meters except median for One Touch  PRE-MEAL Fasting Lunch Dinner Overnight  Overall  Glucose range: 106-252  99-232  132  196-400+    Mean/median:     187   POST-MEAL PC Breakfast PC Lunch PC Dinner  Glucose range:   144, 145   Mean/median:       Physical activity: none, previously was exercising on the bike   Wt Readings from Last  3 Encounters:  04/27/17 278 lb 3.2 oz (126.2 kg)  04/20/17 278 lb 6.4 oz (126.3 kg)  03/16/17 280 lb (127 kg)    Lab Results  Component Value Date   HGBA1C 9.0 (H) 03/13/2017   HGBA1C 8.4 (H) 12/12/2016   HGBA1C 8.4 (H) 08/03/2016   Lab Results  Component Value Date   MICROALBUR 0.7 08/03/2016   LDLCALC 58 12/12/2016   CREATININE 1.44 04/25/2017    OTHER problems addressed today are in review of systems    Lab on 04/25/2017  Component Date Value Ref Range Status  . Sodium 04/25/2017 141  135 - 145 mEq/L Final  . Potassium 04/25/2017 3.9  3.5 - 5.1 mEq/L Final  . Chloride 04/25/2017 108  96 - 112 mEq/L Final  . CO2 04/25/2017 24  19 - 32 mEq/L Final  . Glucose, Bld 04/25/2017 97  70 - 99 mg/dL Final  . BUN 04/25/2017 25* 6 - 23 mg/dL  Final  . Creatinine, Ser 04/25/2017 1.44  0.40 - 1.50 mg/dL Final  . Calcium 04/25/2017 8.8  8.4 - 10.5 mg/dL Final  . GFR 04/25/2017 51.64* >60.00 mL/min Final    Allergies as of 04/27/2017      Reactions   Viagra [sildenafil Citrate]       Medication List        Accurate as of 04/27/17  8:45 AM. Always use your most recent med list.          amLODipine 5 MG tablet Commonly known as:  NORVASC TAKE 1 TABLET BY MOUTH EVERY DAY   aspirin 81 MG tablet Take 81 mg by mouth daily.   atorvastatin 20 MG tablet Commonly known as:  LIPITOR TAKE 1 TABLET BY MOUTH EVERY DAY   canagliflozin 100 MG Tabs tablet Commonly known as:  INVOKANA 1 tablet before breakfast   furosemide 20 MG tablet Commonly known as:  LASIX TAKE 1 TABLET BY MOUTH EVERY DAY   glucose blood test strip Commonly known as:  BAYER CONTOUR NEXT TEST Use as instructed to check blood sugars 4 times per day dx code E10.65   HUMALOG 100 UNIT/ML injection Generic drug:  insulin lispro USE MAX 110 UNITS PER DAY WITH INSULIN PUMP   methylphenidate 10 MG CR capsule Commonly known as:  METADATE CD Take 1 capsule (10 mg total) by mouth every morning.   olmesartan 20 MG tablet Commonly known as:  BENICAR TAKE 1 TABLET BY MOUTH EVERY DAY   PARoxetine 40 MG tablet Commonly known as:  PAXIL TAKE 1 TABLET (40 MG TOTAL) BY MOUTH AT BEDTIME.   testosterone 50 MG/5GM (1%) Gel Commonly known as:  ANDROGEL APPLY 1 AND 1/2 PACKET ONTO THE SKIN DAILY   Vitamin D 2000 units Caps Take by mouth. Takes 6000 daily       Allergies:  Allergies  Allergen Reactions  . Viagra [Sildenafil Citrate]     Past Medical History:  Diagnosis Date  . Carpal tunnel syndrome   . Depression   . Diabetes mellitus without complication ([place])   . ED (erectile dysfunction)   . Edema   . Hyperlipidemia   . Hypertension   . Lumbar disc disease   . Neuropathy   . Stroke [facility])     Past Surgical History:  Procedure Laterality Date    . BACK SURGERY    . VASECTOMY      Family History  Problem Relation Age of Onset  . Cancer Father  Prostate  . Diabetes Neg Hx     Social History:  reports that  has never smoked. he has never used smokeless tobacco. He reports that he does not drink alcohol or use drugs.  REVIEW of systems:   He has been on vitamin D supplementation post last few years and on his own has been taking 6000 units daily, last level as follows:  Lab Results  Component Value Date   VD25OH 57.15 03/16/2017   VD25OH 43 09/02/2012    On his last visit he was complaining about periodic achiness in his arms no weakness but his evaluation did not show any signs of inflammation or obvious diagnosis He says that this is not extremely painful and he wants to wait to see a rheumatologist  Anxiety depression and ADD: He does see the psychologist He has taken Ritalin but mostly as needed with benefit  EDEMA: He has had good control with taking Lasix 20 mg every other daily, dose was reduced when he started Invokana He will not take it in the morning if he is going for an appointment  HYPOGONADISM:  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   Currently on AndroGel 5 g packets, Using 1-1/2 packets daily He is is compliant with this his last level is normal   Lab Results  Component Value Date   TESTOSTERONE 484.73 12/12/2016     HYPERTENSION: Appears better with taking Invokana in addition to Benicar 20 mg along with  5 mg amlodipine No lightheadedness  BP Readings from Last 3 Encounters:  04/27/17 128/68  04/20/17 138/82  03/16/17 (!) 142/74      His creatinine tends to be upper normal and slightly higher compared to January but about the same as in 2018 Lasix was reduced to every other day with starting Invokana   Lab Results  Component Value Date   CREATININE 1.44 04/25/2017    CREATININE 1.31 03/13/2017   CREATININE 1.44 12/12/2016     Hypercholesterolemia: Well controlled on Lipitor 20 mg, tends to have low HDL   Lab Results  Component Value Date   CHOL 127 12/12/2016   HDL 33.00 (L) 12/12/2016   LDLCALC 58 12/12/2016   LDLDIRECT 67.0 08/02/2015   TRIG 183.0 (H) 12/12/2016   CHOLHDL 4 12/12/2016    Has history of retinopathy, has follow-up appointment scheduled   EXAM:   BP 128/68   Pulse 72   Ht 5\' 7"  (1.702 m)   Wt 278 lb 3.2 oz (126.2 kg)   SpO2 96%   BMI 43.57 kg/m     ASSESSMENT/PLAN:  DIABETES Type I and obesity:  See history of present illness for detailed discussion on current blood sugar patterns and problems identified  His blood sugars may be somewhat better with starting Invokana in addition to his insulin pump Previously A1c is higher at 9%  However he is not having good compliance with monitoring his blood sugars Despite reminding him to look into the DexCom sensor he has still not done this Again explained to him benefits of doing this  Currently his blood sugars appear to be mostly high overnight although not checking all the time, his basal rate is almost the same throughout the day except after midnight and between 8-11 PM  Recommendations:    He call the local representative about the DexCom sensor and he will need to get this started  More blood sugar monitor  He  was call [employer] to help him make the meter communicate with this pump  He will need to start using the exercise bike  Continue Invokana  He must bolus with checking his blood sugar at mealtimes and not just bolus without a glucose and discussed importance of doing this   HYPERTENSION: Blood pressure is controlled and better with Invokana Since his creatinine is trending higher he will reduce his BENICAR to half a tablet   Upper extremity myalgia: Etiology unclear and he has independent symptoms, workup negative His does not want to see a  rheumatologist at this time   History of vitamin D deficiency: Adequately supplemented   Edema: Controlled with Lasix every other day although not still completely resolved   Counseling time on subjects discussed in assessment and plan sections is over 50% of today's 25 minute visit     There are no Patient Instructions on file for this visit.     Elayne Snare 04/27/2017

## 2017-04-27 ENCOUNTER — Ambulatory Visit: Payer: Medicare Other | Admitting: Endocrinology

## 2017-04-27 ENCOUNTER — Encounter: Payer: Self-pay | Admitting: Endocrinology

## 2017-04-27 VITALS — BP 128/68 | HR 72 | Ht 67.0 in | Wt 278.2 lb

## 2017-04-27 DIAGNOSIS — E291 Testicular hypofunction: Secondary | ICD-10-CM

## 2017-04-27 DIAGNOSIS — E1065 Type 1 diabetes mellitus with hyperglycemia: Secondary | ICD-10-CM | POA: Diagnosis not present

## 2017-04-27 NOTE — Patient Instructions (Signed)
Take Olmesartan in 1/2

## 2017-05-07 ENCOUNTER — Other Ambulatory Visit: Payer: Self-pay | Admitting: Endocrinology

## 2017-05-15 ENCOUNTER — Telehealth: Payer: Self-pay | Admitting: Pulmonary Disease

## 2017-05-15 NOTE — Telephone Encounter (Signed)
rec'd DPR from front office that pt did not sign Pt is needing to sign DPR Left message for pt to come by office to sign the DPR as soon as possible Leaving form in envelope in brown folder up front office for pt to sign Will follow up

## 2017-05-18 NOTE — Telephone Encounter (Signed)
Called patient unable to reach left message to give us a call back.

## 2017-05-18 NOTE — Telephone Encounter (Signed)
Pt is calling back [phone]

## 2017-05-18 NOTE — Telephone Encounter (Signed)
Spoke with the pt and notified to come in and sign th DPR so we can file it  He verbalized understanding  Will come by wk on 05/21/17

## 2017-05-24 DIAGNOSIS — G4733 Obstructive sleep apnea (adult) (pediatric): Secondary | ICD-10-CM

## 2017-05-25 ENCOUNTER — Other Ambulatory Visit: Payer: Self-pay | Admitting: *Deleted

## 2017-05-25 DIAGNOSIS — G4733 Obstructive sleep apnea (adult) (pediatric): Secondary | ICD-10-CM

## 2017-05-29 ENCOUNTER — Telehealth: Payer: Self-pay | Admitting: Pulmonary Disease

## 2017-05-29 DIAGNOSIS — G4733 Obstructive sleep apnea (adult) (pediatric): Secondary | ICD-10-CM | POA: Diagnosis not present

## 2017-05-29 NOTE — Telephone Encounter (Signed)
HST 05/24/17 >> AHI 29.7, SaO2 low 80%.   Will have my nurse inform pt that sleep study shows moderate to severe sleep apnea.  Options are 1) CPAP now, 2) ROV first.  If He is agreeable to CPAP, then please send order for auto CPAP range 5 to 15 cm H2O with heated humidity and mask of choice.  Have download sent 1 month after starting CPAP and set up ROV 2 months after starting CPAP.  ROV can be with me or NP.

## 2017-05-30 NOTE — Telephone Encounter (Signed)
Called patient, Patient stated that he has been on cpap machine for 10 years now and would like to come in to discus getting a new machine with doctor sood.

## 2017-06-06 ENCOUNTER — Encounter (INDEPENDENT_AMBULATORY_CARE_PROVIDER_SITE_OTHER): Payer: Medicare Other | Admitting: Ophthalmology

## 2017-06-06 DIAGNOSIS — E103391 Type 1 diabetes mellitus with moderate nonproliferative diabetic retinopathy without macular edema, right eye: Secondary | ICD-10-CM | POA: Diagnosis not present

## 2017-06-06 DIAGNOSIS — H26493 Other secondary cataract, bilateral: Secondary | ICD-10-CM

## 2017-06-06 DIAGNOSIS — D3132 Benign neoplasm of left choroid: Secondary | ICD-10-CM

## 2017-06-06 DIAGNOSIS — E103592 Type 1 diabetes mellitus with proliferative diabetic retinopathy without macular edema, left eye: Secondary | ICD-10-CM

## 2017-06-06 DIAGNOSIS — H35033 Hypertensive retinopathy, bilateral: Secondary | ICD-10-CM | POA: Diagnosis not present

## 2017-06-06 DIAGNOSIS — I10 Essential (primary) hypertension: Secondary | ICD-10-CM

## 2017-06-06 DIAGNOSIS — E10319 Type 1 diabetes mellitus with unspecified diabetic retinopathy without macular edema: Secondary | ICD-10-CM | POA: Diagnosis not present

## 2017-06-06 DIAGNOSIS — H43813 Vitreous degeneration, bilateral: Secondary | ICD-10-CM | POA: Diagnosis not present

## 2017-06-06 LAB — HM DIABETES EYE EXAM

## 2017-06-07 ENCOUNTER — Encounter: Payer: Self-pay | Admitting: Endocrinology

## 2017-06-08 ENCOUNTER — Other Ambulatory Visit: Payer: Self-pay | Admitting: Endocrinology

## 2017-06-20 NOTE — Addendum Note (Signed)
Addended by: Georjean Mode on: 06/20/2017 10:59 AM   Modules accepted: Orders

## 2017-06-20 NOTE — Telephone Encounter (Signed)
Called and spoke with pt regarding cpap machine  Pt is cancelling appt for Friday morning  Placed order for CPAP, range 5 to 15 cm H2O with heated humidity and mask of choice.   Have download sent 1 month after starting CPAP and set up ROV 2 months after starting CPAP.  ROV can be with me or NP.

## 2017-06-22 ENCOUNTER — Ambulatory Visit: Payer: Medicare Other | Admitting: Pulmonary Disease

## 2017-06-29 ENCOUNTER — Other Ambulatory Visit: Payer: Self-pay | Admitting: Endocrinology

## 2017-07-06 ENCOUNTER — Encounter (INDEPENDENT_AMBULATORY_CARE_PROVIDER_SITE_OTHER): Payer: Medicare Other | Admitting: Ophthalmology

## 2017-07-06 DIAGNOSIS — H2702 Aphakia, left eye: Secondary | ICD-10-CM

## 2017-07-10 ENCOUNTER — Other Ambulatory Visit: Payer: Self-pay | Admitting: Endocrinology

## 2017-07-31 ENCOUNTER — Other Ambulatory Visit (INDEPENDENT_AMBULATORY_CARE_PROVIDER_SITE_OTHER): Payer: Medicare Other

## 2017-07-31 DIAGNOSIS — E291 Testicular hypofunction: Secondary | ICD-10-CM | POA: Diagnosis not present

## 2017-07-31 DIAGNOSIS — E1065 Type 1 diabetes mellitus with hyperglycemia: Secondary | ICD-10-CM | POA: Diagnosis not present

## 2017-07-31 LAB — COMPREHENSIVE METABOLIC PANEL
ALBUMIN: 3.8 g/dL (ref 3.5–5.2)
ALK PHOS: 65 U/L (ref 39–117)
ALT: 18 U/L (ref 0–53)
AST: 13 U/L (ref 0–37)
BUN: 26 mg/dL — AB (ref 6–23)
CALCIUM: 8.9 mg/dL (ref 8.4–10.5)
CO2: 24 mEq/L (ref 19–32)
CREATININE: 1.42 mg/dL (ref 0.40–1.50)
Chloride: 107 mEq/L (ref 96–112)
GFR: 52.44 mL/min — ABNORMAL LOW (ref >60.00)
Glucose, Bld: 123 mg/dL — ABNORMAL HIGH (ref 70–99)
Potassium: 4 mEq/L (ref 3.5–5.1)
SODIUM: 141 meq/L (ref 135–145)
TOTAL PROTEIN: 6.1 g/dL (ref 6.0–8.3)
Total Bilirubin: 0.5 mg/dL (ref 0.2–1.2)

## 2017-07-31 LAB — MICROALBUMIN / CREATININE URINE RATIO
Creatinine,U: 99.2 mg/dL
MICROALB/CREAT RATIO: 0.8 mg/g (ref 0.0–30.0)
Microalb, Ur: 0.8 mg/dL (ref 0.0–1.9)

## 2017-07-31 LAB — HEMOGLOBIN A1C: HEMOGLOBIN A1C: 8.2 % — AB (ref 4.6–6.5)

## 2017-07-31 LAB — TESTOSTERONE: Testosterone: 301.6 ng/dL (ref 300.00–890.00)

## 2017-08-02 ENCOUNTER — Ambulatory Visit: Payer: Medicare Other | Admitting: Endocrinology

## 2017-08-02 ENCOUNTER — Encounter: Payer: Self-pay | Admitting: Endocrinology

## 2017-08-02 VITALS — BP 134/70 | HR 78 | Ht 67.0 in | Wt 277.4 lb

## 2017-08-02 DIAGNOSIS — I10 Essential (primary) hypertension: Secondary | ICD-10-CM | POA: Diagnosis not present

## 2017-08-02 DIAGNOSIS — E559 Vitamin D deficiency, unspecified: Secondary | ICD-10-CM

## 2017-08-02 DIAGNOSIS — E291 Testicular hypofunction: Secondary | ICD-10-CM | POA: Diagnosis not present

## 2017-08-02 DIAGNOSIS — E1065 Type 1 diabetes mellitus with hyperglycemia: Secondary | ICD-10-CM

## 2017-08-02 NOTE — Patient Instructions (Addendum)
1 1/2 packs daily of androgel  Check sugar 4 x daily

## 2017-08-02 NOTE — Progress Notes (Signed)
Patient ID: Paul Adkins, male   DOB: [birth year], 72 y.o.   MRN: [id number]    Reason for visit:   follow-up  Diagnosis: Type 1 diabetes, date of onset 1978  PAST history: He has had persistently poorly controlled diabetes for several years. A1c has been high with usual range 8.2 -9, overall improved since 2013. Compliance with glucose monitoring and diet has been variable and he does better when he is checking more sugars. His A1c was better than usual in 3/14 at 7.5 but he was also having significant hypoglycemia overnight and also occasionally later in the day. At that time he was started on Invokana 300 mg daily which helped him with glucose control and mild weight loss  CURRENT insulin pump brand:  Medtronic   PUMP SETTINGS are:   Basal rates: 2.25 from midnight- 4 AM. 4 AM = 2.  75.  8:30 pM = 2.6.  Boluses : 1 unit for 6 g carbs at mealtimes and blood sugar target 110-120, sensitivity 1: 20 overnight, 1:25 until 8 PM and then 1: 20 with active insulin 3 hours.  Changing infusion set every 3 days  Average total daily insulin = 115+ with 64 units basal units  RECENT history:   His A1c is improved at 8.2%, previously was at 9% and has been consistently over 8%  Current blood sugar patterns and problems:  He has not checked her sugars much at all and has only sporadic readings, mostly in the morning  Although most of his morning readings are relatively high his fasting glucose in the lab was fairly good  Again his glucose meter is not meeting with his pump and the meter does not appear to have any readings in the last month to review also possibly because of the wrong date programmed  And does have difficulty with communicating his pump and meter and has minimal blood sugars in his pump  However even review of his glucose monitor indicates checking blood sugars mostly in the mornings and some overnight  He says that his blood sugars are higher at night and he will  usually break up with symptoms of hyperglycemia and take extra boluses  With [facility] he has a stable weight and may have her improved A1c with this  Because of hip pain he has not been able to walk much or do any exercise  MEALTIME boluses: He is not entering his carbohydrates or blood sugars at the time of boluses  He says that he has been traveling much more and does not watch his diet consistently when he does that, however has not gained weight     Changing infusion set every 3-4 days  GLUCOSE readings: Reviewed for the last 2 weeks from download:  FASTING blood sugars: 182 today, after breakfast 228, 225 3 AM = 174 Afternoon/suppertime 154, 179  Physical activity: none, previously was exercising on the bike   Wt Readings from Last 3 Encounters:  08/02/17 277 lb 6.4 oz (125.8 kg)  04/27/17 278 lb 3.2 oz (126.2 kg)  04/20/17 278 lb 6.4 oz (126.3 kg)    Lab Results  Component Value Date   HGBA1C 8.2 (H) 07/31/2017   HGBA1C 9.0 (H) 03/13/2017   HGBA1C 8.4 (H) 12/12/2016   Lab Results  Component Value Date   MICROALBUR 0.8 07/31/2017   LDLCALC 58 12/12/2016   CREATININE 1.42 07/31/2017    OTHER problems addressed today are in review of systems    Lab on 07/31/2017  Component Date Value Ref Range Status  . Testosterone 07/31/2017 301.60  300.00 - 890.00 ng/dL Final  . Microalb, Ur 07/31/2017 0.8  0.0 - 1.9 mg/dL Final  . Creatinine,U 07/31/2017 99.2  mg/dL Final  . Microalb Creat Ratio 07/31/2017 0.8  0.0 - 30.0 mg/g Final  . Sodium 07/31/2017 141  135 - 145 mEq/L Final  . Potassium 07/31/2017 4.0  3.5 - 5.1 mEq/L Final  . Chloride 07/31/2017 107  96 - 112 mEq/L Final  . CO2 07/31/2017 24  19 - 32 mEq/L Final  . Glucose, Bld 07/31/2017 123* 70 - 99 mg/dL Final  . BUN 07/31/2017 26* 6 - 23 mg/dL Final  . Creatinine, Ser 07/31/2017 1.42  0.40 - 1.50 mg/dL Final  . Total Bilirubin 07/31/2017 0.5  0.2 - 1.2 mg/dL Final  . Alkaline Phosphatase 07/31/2017 65  39 -  117 U/L Final  . AST 07/31/2017 13  0 - 37 U/L Final  . ALT 07/31/2017 18  0 - 53 U/L Final  . Total Protein 07/31/2017 6.1  6.0 - 8.3 g/dL Final  . Albumin 07/31/2017 3.8  3.5 - 5.2 g/dL Final  . Calcium 07/31/2017 8.9  8.4 - 10.5 mg/dL Final  . GFR 07/31/2017 52.44* >60.00 mL/min Final  . Hgb A1c MFr Bld 07/31/2017 8.2* 4.6 - 6.5 % Final   Glycemic Control Guidelines for People with Diabetes:Non Diabetic:  <6%Goal of Therapy: <7%Additional Action Suggested:  >8%     Allergies as of 08/02/2017      Reactions   Viagra [sildenafil Citrate]       Medication List        Accurate as of 08/02/17  9:17 AM. Always use your most recent med list.          amLODipine 5 MG tablet Commonly known as:  NORVASC TAKE 1 TABLET BY MOUTH EVERY DAY   aspirin 81 MG tablet Take 81 mg by mouth daily.   atorvastatin 20 MG tablet Commonly known as:  LIPITOR TAKE 1 TABLET BY MOUTH EVERY DAY   furosemide 20 MG tablet Commonly known as:  LASIX TAKE 1 TABLET BY MOUTH EVERY DAY   glucose blood test strip Commonly known as:  BAYER CONTOUR NEXT TEST Use as instructed to check blood sugars 4 times per day dx code E10.65   HUMALOG 100 UNIT/ML injection Generic drug:  insulin lispro USE MAX 110 UNITS PER DAY WITH INSULIN PUMP   INVOKANA 100 MG Tabs tablet Generic drug:  canagliflozin TAKE 1 TABLET BEFORE BREAKFAST   methylphenidate 10 MG CR capsule Commonly known as:  METADATE CD Take 1 capsule (10 mg total) by mouth every morning.   olmesartan 20 MG tablet Commonly known as:  BENICAR TAKE 1 TABLET BY MOUTH EVERY DAY   PARoxetine 40 MG tablet Commonly known as:  PAXIL TAKE 1 TABLET (40 MG TOTAL) BY MOUTH AT BEDTIME.   testosterone 50 MG/5GM (1%) Gel Commonly known as:  ANDROGEL APPLY 1 AND 1/2 PACKET ONTO THE SKIN DAILY   Vitamin D 2000 units Caps Take by mouth. Takes 6000 daily       Allergies:  Allergies  Allergen Reactions  . Viagra [Sildenafil Citrate]     Past Medical  History:  Diagnosis Date  . Carpal tunnel syndrome   . Depression   . Diabetes mellitus without complication ([place])   . ED (erectile dysfunction)   . Edema   . Hyperlipidemia   . Hypertension   . Lumbar disc disease   .  Neuropathy   . Stroke [facility])     Past Surgical History:  Procedure Laterality Date  . BACK SURGERY    . VASECTOMY      Family History  Problem Relation Age of Onset  . Cancer Father        Prostate  . Diabetes Neg Hx     Social History:  reports that he has never smoked. He has never used smokeless tobacco. He reports that he does not drink alcohol or use drugs.  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   Currently on AndroGel 5 g packets, prescribed 1- 1-1/2 packets daily However he now says that sometimes he is using one packet and other days 1-1/2  Although his testosterone level is over 300 he thinks that he still does feel fatigued   Lab Results  Component Value Date   TESTOSTERONE 301.60 07/31/2017     HYPERTENSION: Controlled with taking Invokana in addition to Benicar 10 mg along with  5 mg amlodipine Benicar was reduced when creatinine was relatively higher No lightheadedness  BP Readings from Last 3 Encounters:  08/02/17 134/70  04/27/17 128/68  04/20/17 138/82      His creatinine tends to be upper normal but stable  Lasix was reduced to every  other day with starting Invokana   Lab Results  Component Value Date   CREATININE 1.42 07/31/2017   CREATININE 1.44 04/25/2017   CREATININE 1.31 03/13/2017     Hypercholesterolemia: Well controlled on Lipitor 20 mg, tends to have low HDL   Lab Results  Component Value Date   CHOL 127 12/12/2016   HDL 33.00 (L) 12/12/2016   LDLCALC 58 12/12/2016   LDLDIRECT 67.0 08/02/2015   TRIG 183.0 (H) 12/12/2016   CHOLHDL 4 12/12/2016    Has history of retinopathy, has regular follow-up with specialist   EXAM:   BP 134/70 (BP Location: Left Arm, Patient Position: Sitting, Cuff Size: Normal)   Pulse 78   Ht 5\' 7"  (1.702 m)   Wt 277 lb 6.4 oz (125.8 kg)   SpO2 97%   BMI 43.45 kg/m     ASSESSMENT/PLAN:  DIABETES Type I and obesity:  See history of present illness for detailed discussion on current blood sugar patterns and problems identified  His blood sugars may be somewhat better with starting Invokana in addition to his insulin pump A1c is 8.2 which is somewhat better than the last 2 readings  However he is not improving his compliance with glucose monitoring and is checking blood sugars even less than before He did better with the CGM but the freestyle Elenor Legato was falsely low Despite reminding him to look into the DexCom sensor at least twice he has still not done this This will also be useful and alerting him to blood sugars going unusually high or low in time  I have given him the  local contact information for this today Also given him information on the local representative to contact for linking his meter in his pump  Currently his blood sugars appear to be mostly high overnight but not consistent and not clear if he needs a change in his settings Also hopefully with not traveling as much he will be more consistent with diet and blood sugars will be up more consistent at night    HYPERTENSION: Blood pressure is controlled even with reducing Benicar and he will  continue the same dose along with Invokana   Renal function: Stable  HYPOGONADISM: He thinks he has some fatigue persisting and with his testosterone level about 300 he probably needs to try and be more consistent with taking 1-1/2 packets daily of the AndroGel instead of irregularly taking 1 or 1-1/2 We will recheck level on the next visit  Total visit time for evaluation and management of multiple problems and counseling =25 minutes   There are no Patient Instructions on file for this visit.     Elayne Snare 08/02/2017

## 2017-08-05 ENCOUNTER — Telehealth: Payer: Self-pay | Admitting: Endocrinology

## 2017-08-06 NOTE — Telephone Encounter (Signed)
Please refill if appropriate

## 2017-08-17 ENCOUNTER — Ambulatory Visit: Payer: Medicare Other | Admitting: Pulmonary Disease

## 2017-09-12 ENCOUNTER — Telehealth: Payer: Self-pay | Admitting: Endocrinology

## 2017-09-12 NOTE — Telephone Encounter (Signed)
Pt is calling regarding the him sending his BS readings from his device Dexcom G5 it needs a code in order to do this? Do you know what this could be? Paul Adkins maybe?

## 2017-09-13 NOTE — Telephone Encounter (Signed)
Please advise 

## 2017-10-04 ENCOUNTER — Other Ambulatory Visit: Payer: Self-pay | Admitting: Endocrinology

## 2017-10-08 ENCOUNTER — Ambulatory Visit: Payer: Medicare Other | Admitting: Pulmonary Disease

## 2017-10-08 ENCOUNTER — Encounter: Payer: Self-pay | Admitting: Pulmonary Disease

## 2017-10-08 VITALS — BP 132/78 | HR 83 | Ht 67.0 in | Wt 280.6 lb

## 2017-10-08 DIAGNOSIS — Z6841 Body Mass Index (BMI) 40.0 and over, adult: Secondary | ICD-10-CM

## 2017-10-08 DIAGNOSIS — Z9989 Dependence on other enabling machines and devices: Secondary | ICD-10-CM

## 2017-10-08 DIAGNOSIS — G4733 Obstructive sleep apnea (adult) (pediatric): Secondary | ICD-10-CM

## 2017-10-08 NOTE — Progress Notes (Signed)
[facility]  Chief Complaint  Patient presents with  . Follow-up    follow up new CPAP machine. patient states this is doing well.     Constitutional: BP 132/78 (BP Location: Left Arm, Cuff Size: Normal)   Pulse 83   Ht 5\' 7"  (1.702 m)   Wt 280 lb 9.6 oz (127.3 kg)   SpO2 97%   BMI 43.95 kg/m   History of Present Illness: Paul Adkins is a 72 y.o. male with obstructive sleep apnea.  Since last visit he had sleep study.  Moderate to severe sleep apnea.  Started on auto CPAP.  Has nasal mask.  Mask fit okay.  No issues with mouth leak.  Doesn't use humidifier.  CPAP download shows AHI still elevated, but seems that pressure might need to be higher.  Comprehensive Respiratory Exam:  Appearance - well kempt  ENMT - nasal mucosa moist, turbinates clear, midline nasal septum, no dental lesions, no gingival bleeding, no oral exudates, no tonsillar hypertrophy, MP 3, scalloped tongue Neck - no masses, trachea midline, no thyromegaly, no elevation in JVP Respiratory - normal appearance of chest wall, normal respiratory effort w/o accessory muscle use, no dullness on percussion, no wheezing or rales CV - s1s2 regular rate and rhythm, no murmurs, 1+ peripheral edema, radial pulses symmetric GI - soft, non tender, no masses Lymph - no adenopathy noted in neck and axillary areas MSK - normal muscle strength and tone, normal gait Ext - no cyanosis, clubbing, or joint inflammation noted Skin - no rashes, lesions, or ulcers Neuro - oriented to person, place, and time Psych - normal mood and affect  Assessment/Plan:  Obstructive sleep apnea. - he is compliant with CPAP and reports benefit - main issue seems to be needing higher pressure range - will change to auto CPAP 5 - 20 cm H2O and repeat download  Obesity. - discussed options to assist with weight loss   Patient Instructions  Will have your auto CPAP change to 5 -20 cm H2O  Follow up  in 1 year   Chesley Mires, MD [place] 10/08/2017, 10:09 AM Pager:  [phone]  Flow Sheet  Sleep tests: HST 05/24/17 >> AHI 29.7, SaO2 low 80% Auto CPAP 09/05/17 to 10/04/17 >> used on 30 of 30 nights with average 9 hrs 44 min.  Average AHI 21.3 with median CPAP 11 and 95 th percentile CPAP 15 cm H2O  Past Medical History: He  has a past medical history of Carpal tunnel syndrome, Depression, Diabetes mellitus without complication ([place]), ED (erectile dysfunction), Edema, Hyperlipidemia, Hypertension, Lumbar disc disease, Neuropathy, and Stroke ([place]).  Past Surgical History: He  has a past surgical history that includes Back surgery and Vasectomy.  Family History: His family history includes Cancer in his father.  Social History: He  reports that he has never smoked. He has never used smokeless tobacco. He reports that he does not drink alcohol or use drugs.  Medications: Allergies as of 10/08/2017      Reactions   Viagra [sildenafil Citrate]       Medication List        Accurate as of 10/08/17 10:09 AM. Always use your most recent med list.          amLODipine 5 MG tablet Commonly known as:  NORVASC TAKE 1 TABLET BY MOUTH EVERY DAY   aspirin 81 MG tablet Take 81 mg by mouth daily.   atorvastatin 20 MG tablet Commonly known as:  LIPITOR TAKE 1 TABLET BY MOUTH EVERY DAY   furosemide 20 MG tablet Commonly known as:  LASIX TAKE 1 TABLET BY MOUTH EVERY DAY   glucose blood test strip Use as instructed to check blood sugars 4 times per day dx code E10.65   HUMALOG 100 UNIT/ML injection Generic drug:  insulin lispro USE MAX 110 UNITS PER DAY WITH INSULIN PUMP   INVOKANA 100 MG Tabs tablet Generic drug:  canagliflozin TAKE 1 TABLET BEFORE BREAKFAST   methylphenidate 10 MG CR capsule Commonly known as:  METADATE CD Take 1 capsule (10 mg total) by mouth every morning.   olmesartan 20 MG tablet Commonly known as:  BENICAR TAKE 1 TABLET BY  MOUTH EVERY DAY   PARoxetine 40 MG tablet Commonly known as:  PAXIL TAKE 1 TABLET (40 MG TOTAL) BY MOUTH AT BEDTIME.   testosterone 50 MG/5GM (1%) Gel Commonly known as:  ANDROGEL APPLY 1 AND 1/2 PACKETS ONTO SKIN DAILY   Vitamin D 2000 units Caps Take by mouth. Takes 6000 daily

## 2017-10-08 NOTE — Patient Instructions (Signed)
Will have your auto CPAP change to 5 -20 cm H2O  Follow up in 1 year

## 2017-10-30 ENCOUNTER — Other Ambulatory Visit (INDEPENDENT_AMBULATORY_CARE_PROVIDER_SITE_OTHER): Payer: Medicare Other

## 2017-10-30 DIAGNOSIS — E1065 Type 1 diabetes mellitus with hyperglycemia: Secondary | ICD-10-CM

## 2017-10-30 DIAGNOSIS — E291 Testicular hypofunction: Secondary | ICD-10-CM

## 2017-10-30 DIAGNOSIS — E559 Vitamin D deficiency, unspecified: Secondary | ICD-10-CM | POA: Diagnosis not present

## 2017-10-30 LAB — LIPID PANEL
CHOL/HDL RATIO: 4
Cholesterol: 119 mg/dL (ref 0–200)
HDL: 30.9 mg/dL — ABNORMAL LOW (ref >39.00)
LDL Cholesterol: 65 mg/dL (ref 0–99)
NONHDL: 88.34
TRIGLYCERIDES: 116 mg/dL (ref 0.0–149.0)
VLDL: 23.2 mg/dL (ref 0.0–40.0)

## 2017-10-30 LAB — COMPREHENSIVE METABOLIC PANEL
ALT: 17 U/L (ref 0–53)
AST: 14 U/L (ref 0–37)
Albumin: 3.8 g/dL (ref 3.5–5.2)
Alkaline Phosphatase: 58 U/L (ref 39–117)
BILIRUBIN TOTAL: 0.4 mg/dL (ref 0.2–1.2)
BUN: 31 mg/dL — ABNORMAL HIGH (ref 6–23)
CALCIUM: 8.6 mg/dL (ref 8.4–10.5)
CO2: 25 meq/L (ref 19–32)
Chloride: 108 mEq/L (ref 96–112)
Creatinine, Ser: 1.53 mg/dL — ABNORMAL HIGH (ref 0.40–1.50)
GFR: 48.08 mL/min — AB (ref >60.00)
GLUCOSE: 99 mg/dL (ref 70–99)
POTASSIUM: 4 meq/L (ref 3.5–5.1)
Sodium: 141 mEq/L (ref 135–145)
Total Protein: 6.2 g/dL (ref 6.0–8.3)

## 2017-10-30 LAB — VITAMIN D 25 HYDROXY (VIT D DEFICIENCY, FRACTURES): VITD: 50.27 ng/mL (ref 30.00–100.00)

## 2017-10-30 LAB — HEMOGLOBIN A1C: Hgb A1c MFr Bld: 6.7 % — ABNORMAL HIGH (ref 4.6–6.5)

## 2017-10-30 LAB — TESTOSTERONE: Testosterone: 333.79 ng/dL (ref 300.00–890.00)

## 2017-11-01 NOTE — Progress Notes (Signed)
Patient ID: Paul Adkins, male   DOB: 1945-07-10, 72 y.o.   MRN: [id number]    Reason for visit:   follow-up  Diagnosis: Type 1 diabetes, date of onset 1978  PAST history: He has had persistently poorly controlled diabetes for several years. A1c has been high with usual range 8.2 -9, overall improved since 2013. Compliance with glucose monitoring and diet has been variable and he does better when he is checking more sugars. His A1c was better than usual in 3/14 at 7.5 but he was also having significant hypoglycemia overnight and also occasionally later in the day. At that time he was started on Invokana 300 mg daily which helped him with glucose control and mild weight loss  CURRENT insulin pump brand:  Medtronic   PUMP SETTINGS are:   Basal rates: 2.5 from midnight- 4 AM. 4 AM = 2.9.  8 AM = 2.75.  Boluses : 1 unit for 6 g carbs at mealtimes and blood sugar target 110-120, sensitivity 1: 20 overnight, 1:25 until 8 PM and then 1: 20 with active insulin 3 hours.  Changing infusion set every 3 days  Average total daily insulin = 119  with 65 units basal units  RECENT history:   His A1c is markedly improved at 6.7, previously 8.2 and consistently high  Current blood sugar patterns and problems:  He has started using the Dexcom sensor after his last visit  With this he was able to keep a closer check on his sugars and motivated to keep his blood sugars down  He says he has changed his carbohydrate ratio to 1: 5 because of his getting postprandial spikes and the alarm on his Dexcom going off  Also he thinks that bolusing 15 to 30 minutes before eating helps keep his sugar from going up after meals  He is concerned about his slight weight gain  Hypoglycemia has been minimal, tending to be relatively lower suppertime  Because of hip pain he has not been able to walk much or do any exercise  MEALTIME boluses: He is is doing much better with entering his carbohydrates or  blood sugars at the time of boluses  As before he has relatively more frequent meals or snacks and on average 8 boluses per day, 92% with food     Changing infusion set every 3-4 days    GLUCOSE readings: Reviewed for the last 2 weeks from download of Dexcom and pump:   CONTINUOUS GLUCOSE MONITORING RECORD INTERPRETATION    Dates of Recording: 8/24 through 9/6  Results statistics:   CGM use % of time  96  Average and SD  124+/-31  Time in range  94     %  % Time Above 180  4.7  % Time above 250  0  % Time Below target  1.7    Glycemic patterns summary: Blood sugars overall are fairly consistently within the normal range with the highest readings on average at about 4-5 AM and lowest around 10 AM.  Hypoglycemia has been recently minimal.  Overall variability low with standard deviation only 31  Hyperglycemic episodes recently have occurred twice overnight, rarely after lunch and dinner and once late afternoon  Hypoglycemic episodes occurred mostly around 6 PM and is presently on about 3-4 occasions in the last 2 weeks  Overnight periods: Blood sugars are fairly consistently within target except for 2 occasions last month when they were higher during the night Recently his blood sugars showing a trend  towards decline after about 3 AM within the range of 70-180  Preprandial periods: Blood sugars are somewhat variable in the morning, averaging about 120 and similar at lunch Blood sugars may be relatively low at suppertime but more variable, average still about 130  Postprandial periods: After breakfast: Blood sugars are relatively flat and relatively lower by about 10 AM After lunch: Most blood sugars are similar to preprandial readings except for 1 or 2 days After dinner: Most of the blood sugars are within the range and rarely just over 180 average blood sugar gradually increasing until bedtime to about 140   FASTING blood sugars: 182 today, after breakfast 228, 225 3 AM =  174 Afternoon/suppertime 154, 179  Physical activity: none, previously was exercising on the bike   Wt Readings from Last 3 Encounters:  11/02/17 281 lb (127.5 kg)  10/08/17 280 lb 9.6 oz (127.3 kg)  08/02/17 277 lb 6.4 oz (125.8 kg)    Lab Results  Component Value Date   HGBA1C 6.7 (H) 10/30/2017   HGBA1C 8.2 (H) 07/31/2017   HGBA1C 9.0 (H) 03/13/2017   Lab Results  Component Value Date   MICROALBUR 0.8 07/31/2017   LDLCALC 65 10/30/2017   CREATININE 1.53 (H) 10/30/2017    OTHER problems addressed today are in review of systems    Lab on 10/30/2017  Component Date Value Ref Range Status  . VITD 10/30/2017 50.27  30.00 - 100.00 ng/mL Final  . Testosterone 10/30/2017 333.79  300.00 - 890.00 ng/dL Final  . Cholesterol 10/30/2017 119  0 - 200 mg/dL Final   ATP III Classification       Desirable:  < 200 mg/dL               Borderline High:  200 - 239 mg/dL          High:  > = 240 mg/dL  . Triglycerides 10/30/2017 116.0  0.0 - 149.0 mg/dL Final   Normal:  <150 mg/dLBorderline High:  150 - 199 mg/dL  . HDL 10/30/2017 30.90* >39.00 mg/dL Final  . VLDL 10/30/2017 23.2  0.0 - 40.0 mg/dL Final  . LDL Cholesterol 10/30/2017 65  0 - 99 mg/dL Final  . Total CHOL/HDL Ratio 10/30/2017 4   Final                  Men          Women1/2 Average Risk     3.4          3.3Average Risk          5.0          4.42X Average Risk          9.6          7.13X Average Risk          15.0          11.0                      . NonHDL 10/30/2017 88.34   Final   NOTE:  Non-HDL goal should be 30 mg/dL higher than patient's LDL goal (i.e. LDL goal of < 70 mg/dL, would have non-HDL goal of < 100 mg/dL)  . Sodium 10/30/2017 141  135 - 145 mEq/L Final  . Potassium 10/30/2017 4.0  3.5 - 5.1 mEq/L Final  . Chloride 10/30/2017 108  96 - 112 mEq/L Final  . CO2 10/30/2017 25  19 - 32 mEq/L Final  .  Glucose, Bld 10/30/2017 99  70 - 99 mg/dL Final  . BUN 10/30/2017 31* 6 - 23 mg/dL Final  . Creatinine, Ser  10/30/2017 1.53* 0.40 - 1.50 mg/dL Final  . Total Bilirubin 10/30/2017 0.4  0.2 - 1.2 mg/dL Final  . Alkaline Phosphatase 10/30/2017 58  39 - 117 U/L Final  . AST 10/30/2017 14  0 - 37 U/L Final  . ALT 10/30/2017 17  0 - 53 U/L Final  . Total Protein 10/30/2017 6.2  6.0 - 8.3 g/dL Final  . Albumin 10/30/2017 3.8  3.5 - 5.2 g/dL Final  . Calcium 10/30/2017 8.6  8.4 - 10.5 mg/dL Final  . GFR 10/30/2017 48.08* >60.00 mL/min Final  . Hgb A1c MFr Bld 10/30/2017 6.7* 4.6 - 6.5 % Final   Glycemic Control Guidelines for People with Diabetes:Non Diabetic:  <6%Goal of Therapy: <7%Additional Action Suggested:  >8%     Allergies as of 11/02/2017      Reactions   Viagra [sildenafil Citrate]       Medication List        Accurate as of 11/02/17  8:19 AM. Always use your most recent med list.          amLODipine 5 MG tablet Commonly known as:  NORVASC TAKE 1 TABLET BY MOUTH EVERY DAY   aspirin 81 MG tablet Take 81 mg by mouth daily.   atorvastatin 20 MG tablet Commonly known as:  LIPITOR TAKE 1 TABLET BY MOUTH EVERY DAY   furosemide 20 MG tablet Commonly known as:  LASIX TAKE 1 TABLET BY MOUTH EVERY DAY   glucose blood test strip Use as instructed to check blood sugars 4 times per day dx code E10.65   HUMALOG 100 UNIT/ML injection Generic drug:  insulin lispro USE MAX 110 UNITS PER DAY WITH INSULIN PUMP   INVOKANA 100 MG Tabs tablet Generic drug:  canagliflozin TAKE 1 TABLET BEFORE BREAKFAST   methylphenidate 10 MG CR capsule Commonly known as:  METADATE CD Take 1 capsule (10 mg total) by mouth every morning.   olmesartan 20 MG tablet Commonly known as:  BENICAR TAKE 1 TABLET BY MOUTH EVERY DAY   PARoxetine 40 MG tablet Commonly known as:  PAXIL TAKE 1 TABLET (40 MG TOTAL) BY MOUTH AT BEDTIME.   testosterone 50 MG/5GM (1%) Gel Commonly known as:  ANDROGEL APPLY 1 AND 1/2 PACKETS ONTO SKIN DAILY   Vitamin D 2000 units Caps Take by mouth. Takes 6000 daily        Allergies:  Allergies  Allergen Reactions  . Viagra [Sildenafil Citrate]     Past Medical History:  Diagnosis Date  . Carpal tunnel syndrome   . Depression   . Diabetes mellitus without complication ([place])   . ED (erectile dysfunction)   . Edema   . Hyperlipidemia   . Hypertension   . Lumbar disc disease   . Neuropathy   . Stroke [facility])     Past Surgical History:  Procedure Laterality Date  . BACK SURGERY    . VASECTOMY      Family History  Problem Relation Age of Onset  . Cancer Father        Prostate  . Diabetes Neg Hx     Social History:  reports that he has never smoked. He has never used smokeless tobacco. He reports that he does not drink alcohol or use drugs.  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   Currently on AndroGel 5 g packets, prescribed 1- 1-1/2 packets daily He has been taking this more regularly No unusual fatigue Testosterone level slightly higher   Lab Results  Component Value Date   TESTOSTERONE 333.79 10/30/2017     HYPERTENSION: Controlled with taking Invokana in addition to Benicar 10 mg along with  5 mg amlodipine  Benicar dosage is a half of the 20 mg   BP Readings from Last 3 Encounters:  11/02/17 126/64  10/08/17 132/78  08/02/17 134/70      His creatinine tends to be upper normal but now relatively  higher  Lasix was reduced to every other day with starting Invokana   Lab Results  Component Value Date   CREATININE 1.53 (H) 10/30/2017   CREATININE 1.42 07/31/2017   CREATININE 1.44 04/25/2017     Hypercholesterolemia: Well controlled on Lipitor 20 mg, tends to have low HDL   Lab Results  Component Value Date   CHOL 119 10/30/2017   HDL 30.90 (L) 10/30/2017   LDLCALC 65 10/30/2017   LDLDIRECT 67.0 08/02/2015   TRIG 116.0 10/30/2017   CHOLHDL 4 10/30/2017    Has history of retinopathy, has regular follow-up with specialist  For his hip pain he was followed recommendations given recently by orthopedic surgeon   EXAM:   BP 126/64 (BP Location: Right Arm, Patient Position: Sitting, Cuff Size: Normal)   Pulse 80   Ht 5\' 7"  (1.702 m)   Wt 281 lb (127.5 kg)   BMI 44.01 kg/m     ASSESSMENT/PLAN:  DIABETES Type I and obesity:  See history of present illness for detailed discussion on current blood sugar patterns and problems identified  His A1c is 6.4 which is significantly better than what he has had for several years This is mainly from his being more motivated and using the Dexcom sensor to continuously watch his blood sugars and adjust his insulin delivery based on blood sugar patterns and keeping blood sugars mostly in set target range  However with increasing his boluses and basal rate he is getting relatively tight control and some weight gain Currently getting 66 units insulin with basal and about 54 units and bolus insulin on average along with carbohydrate intake of about 240 average per day Has not cut back on his snacks and still bolusing at least 7-8 times a day for various carbohydrate intake Also not exercising Blood sugars are mostly on the low side before dinnertime and rarely on waking up  Discussed day-to-day management with adjustment of basal rates, carbohydrate intake reduction, weight loss, exercise, blood sugar targets after meals and current  patterns analyzed from his Dexcom sensor  He will reduce his basal rate between 5 PM and 7 PM by 0.3 and used 2.55    HYPERTENSION: Blood pressure is controlled but relatively lower now even with reducing Benicar to 10 mg This may be affecting renal  function   Renal function: This is worse and may be related to relatively lower blood pressure, this was discussed Will reduce his Benicar down to 5 mg  HYPOGONADISM: Adequate levels with 1-1/2 packets LDL and he will continue Subjectively doing well also  Counseling time on subjects discussed in assessment and plan sections is over 50% of today's 25 minute visit  Influenza vaccine given  There are no Patient Instructions on file for this visit.     Elayne Snare 11/02/2017

## 2017-11-02 ENCOUNTER — Ambulatory Visit: Payer: Medicare Other | Admitting: Endocrinology

## 2017-11-02 ENCOUNTER — Encounter: Payer: Self-pay | Admitting: Endocrinology

## 2017-11-02 VITALS — BP 126/64 | HR 80 | Ht 67.0 in | Wt 281.0 lb

## 2017-11-02 DIAGNOSIS — N289 Disorder of kidney and ureter, unspecified: Secondary | ICD-10-CM

## 2017-11-02 DIAGNOSIS — Z23 Encounter for immunization: Secondary | ICD-10-CM | POA: Diagnosis not present

## 2017-11-02 DIAGNOSIS — E1065 Type 1 diabetes mellitus with hyperglycemia: Secondary | ICD-10-CM | POA: Diagnosis not present

## 2017-11-02 DIAGNOSIS — E291 Testicular hypofunction: Secondary | ICD-10-CM | POA: Diagnosis not present

## 2017-11-02 MED ORDER — OLMESARTAN MEDOXOMIL 5 MG PO TABS: 5.0000 mg | ORAL_TABLET | Freq: Every day | ORAL | 3 refills | Status: DC

## 2017-11-02 NOTE — Patient Instructions (Signed)
Exercise

## 2017-11-04 ENCOUNTER — Other Ambulatory Visit: Payer: Self-pay | Admitting: Endocrinology

## 2017-11-09 ENCOUNTER — Other Ambulatory Visit: Payer: Self-pay | Admitting: Endocrinology

## 2017-11-30 ENCOUNTER — Other Ambulatory Visit: Payer: Self-pay | Admitting: Endocrinology

## 2017-12-21 ENCOUNTER — Other Ambulatory Visit: Payer: Self-pay | Admitting: Endocrinology

## 2017-12-26 ENCOUNTER — Other Ambulatory Visit: Payer: Self-pay | Admitting: Endocrinology

## 2018-01-08 ENCOUNTER — Encounter (INDEPENDENT_AMBULATORY_CARE_PROVIDER_SITE_OTHER): Payer: Medicare Other | Admitting: Ophthalmology

## 2018-01-08 DIAGNOSIS — I10 Essential (primary) hypertension: Secondary | ICD-10-CM | POA: Diagnosis not present

## 2018-01-08 DIAGNOSIS — E103391 Type 1 diabetes mellitus with moderate nonproliferative diabetic retinopathy without macular edema, right eye: Secondary | ICD-10-CM | POA: Diagnosis not present

## 2018-01-08 DIAGNOSIS — E10319 Type 1 diabetes mellitus with unspecified diabetic retinopathy without macular edema: Secondary | ICD-10-CM | POA: Diagnosis not present

## 2018-01-08 DIAGNOSIS — D3132 Benign neoplasm of left choroid: Secondary | ICD-10-CM

## 2018-01-08 DIAGNOSIS — H43813 Vitreous degeneration, bilateral: Secondary | ICD-10-CM

## 2018-01-08 DIAGNOSIS — H35033 Hypertensive retinopathy, bilateral: Secondary | ICD-10-CM

## 2018-01-08 DIAGNOSIS — E103592 Type 1 diabetes mellitus with proliferative diabetic retinopathy without macular edema, left eye: Secondary | ICD-10-CM | POA: Diagnosis not present

## 2018-01-29 ENCOUNTER — Other Ambulatory Visit (INDEPENDENT_AMBULATORY_CARE_PROVIDER_SITE_OTHER): Payer: Medicare Other

## 2018-01-29 DIAGNOSIS — E1065 Type 1 diabetes mellitus with hyperglycemia: Secondary | ICD-10-CM

## 2018-01-29 LAB — BASIC METABOLIC PANEL
BUN: 39 mg/dL — ABNORMAL HIGH (ref 6–23)
CHLORIDE: 105 meq/L (ref 96–112)
CO2: 23 mEq/L (ref 19–32)
Calcium: 8.9 mg/dL (ref 8.4–10.5)
Creatinine, Ser: 1.54 mg/dL — ABNORMAL HIGH (ref 0.40–1.50)
GFR: 47.68 mL/min — AB (ref >60.00)
GLUCOSE: 154 mg/dL — AB (ref 70–99)
POTASSIUM: 4.1 meq/L (ref 3.5–5.1)
Sodium: 137 mEq/L (ref 135–145)

## 2018-01-29 LAB — HEMOGLOBIN A1C: Hgb A1c MFr Bld: 6.9 % — ABNORMAL HIGH (ref 4.6–6.5)

## 2018-01-31 NOTE — Progress Notes (Addendum)
Patient ID: Paul Adkins, male   DOB: 01/04/1946, 72 y.o.   MRN: [id number]    Reason for visit:   follow-up  Diagnosis: Type 1 diabetes, date of onset 1978  PAST history: He has had persistently poorly controlled diabetes for several years. A1c has been high with usual range 8.2 -9, overall improved since 2013. Compliance with glucose monitoring and diet has been variable and he does better when he is checking more sugars. His A1c was better than usual in 3/14 at 7.5 but he was also having significant hypoglycemia overnight and also occasionally later in the day. At that time he was started on Invokana 300 mg daily which helped him with glucose control and mild weight loss  CURRENT insulin pump brand:  Medtronic   PUMP SETTINGS are:   Basal rates: 2.5 from midnight- 4 AM. 4 AM = 2.9.  8 AM = 2.75.  Boluses : 1 unit for 5 g carbs at mealtimes  Glucose target 110-120, sensitivity 1: 20 overnight, 1:25 until 8 PM and then 1: 20 with active insulin 3 hours.  Changing infusion set every 3 days  Average total daily insulin = 119  with 65 units basal units  RECENT history:   His A1c is still relatively good at 6.9 compared to 6.7   Current blood sugar patterns and problems:  He has continued to benefit from using the Dexcom sensor and keeping his blood sugars generally well controlled  However as discussed below he is having hyperglycemia in the evenings likely postprandial  He is not able to control his portions and he thinks that he is also requiring a lot of insulin to cover his carbohydrates  Currently not exercising much  Again has gained weight   Changing infusion set every 3-4 days    GLUCOSE readings and patterns: Reviewed for the last 2 weeks from download of Dexcom and pump:   CONTINUOUS GLUCOSE MONITORING RECORD INTERPRETATION    Dates of Recording: 11/23 through 12/6  Sensor description: Dexcom 5  Results statistics:   CGM use % of time  99    Average and SD  150+/-53  Time in range       6 8 %  % Time Above 180  29  % Time above 250  3.6  % Time Below target  3.1    Glycemic patterns summary: Basically pattern is of excellent blood sugars in the midmorning and relatively higher the rest of the day but increase in blood sugars after about next p.m., peaking around 9-10 PM and then gradually coming down No hypoglycemia  Hyperglycemic episodes are generally occurring in the evenings after 6 PM and occasionally late at night on most of the days  Hypoglycemic according to the daily blood sugar tracing is appearing an isolated manner around 4 AM, 11:30 AM and 2:30 PM in the last week and transiently at 7:30 AM and these are minimal and asymptomatic.  Overnight periods: Sugars are mostly averaging close to 180 at midnight and gradually decreasing after about 1-2 AM with fairly significant variability  Preprandial periods: Blood sugars are variably controlled at all meals usually within the range of 80-180 before eating  Postprandial periods: After breakfast: Blood sugars are relatively flat; after lunch: Has only occasional high blood sugars after lunch After dinner: Blood sugar very frequently going up significantly except only on a couple of days the peak average blood sugar about 210    Physical activity: none, previously was exercising on  the bike   Wt Readings from Last 3 Encounters:  02/01/18 286 lb 6.4 oz (129.9 kg)  11/02/17 281 lb (127.5 kg)  10/08/17 280 lb 9.6 oz (127.3 kg)    Lab Results  Component Value Date   HGBA1C 6.9 (H) 01/29/2018   HGBA1C 6.7 (H) 10/30/2017   HGBA1C 8.2 (H) 07/31/2017   Lab Results  Component Value Date   MICROALBUR 0.8 07/31/2017   LDLCALC 65 10/30/2017   CREATININE 1.54 (H) 01/29/2018    OTHER problems addressed today are in review of systems    Lab on 01/29/2018  Component Date Value Ref Range Status  . Sodium 01/29/2018 137  135 - 145 mEq/L Final  . Potassium  01/29/2018 4.1  3.5 - 5.1 mEq/L Final  . Chloride 01/29/2018 105  96 - 112 mEq/L Final  . CO2 01/29/2018 23  19 - 32 mEq/L Final  . Glucose, Bld 01/29/2018 154* 70 - 99 mg/dL Final  . BUN 01/29/2018 39* 6 - 23 mg/dL Final  . Creatinine, Ser 01/29/2018 1.54* 0.40 - 1.50 mg/dL Final  . Calcium 01/29/2018 8.9  8.4 - 10.5 mg/dL Final  . GFR 01/29/2018 47.68* >60.00 mL/min Final  . Hgb A1c MFr Bld 01/29/2018 6.9* 4.6 - 6.5 % Final   Glycemic Control Guidelines for People with Diabetes:Non Diabetic:  <6%Goal of Therapy: <7%Additional Action Suggested:  >8%     Allergies as of 02/01/2018      Reactions   Viagra [sildenafil Citrate]       Medication List        Accurate as of 02/01/18  8:27 AM. Always use your most recent med list.          amLODipine 5 MG tablet Commonly known as:  NORVASC TAKE 1 TABLET BY MOUTH EVERY DAY   aspirin 81 MG tablet Take 81 mg by mouth daily.   atorvastatin 20 MG tablet Commonly known as:  LIPITOR TAKE 1 TABLET BY MOUTH EVERY DAY   furosemide 20 MG tablet Commonly known as:  LASIX TAKE 1 TABLET BY MOUTH EVERY DAY   glucose blood test strip Use as instructed to check blood sugars 4 times per day dx code E10.65   HUMALOG 100 UNIT/ML injection Generic drug:  insulin lispro USE MAX 110 UNITS PER DAY WITH INSULIN PUMP   INVOKANA 100 MG Tabs tablet Generic drug:  canagliflozin TAKE 1 TABLET BEFORE BREAKFAST   methylphenidate 10 MG CR capsule Commonly known as:  METADATE CD Take 1 capsule (10 mg total) by mouth every morning.   olmesartan 5 MG tablet Commonly known as:  BENICAR Take 1 tablet (5 mg total) by mouth daily.   PARoxetine 40 MG tablet Commonly known as:  PAXIL TAKE 1 TABLET (40 MG TOTAL) BY MOUTH AT BEDTIME.   testosterone 50 MG/5GM (1%) Gel Commonly known as:  ANDROGEL APPLY 1 AND 1/2 PACKETS ONTO SKIN DAILY   Vitamin D 50 MCG (2000 UT) Caps Take by mouth. Takes 6000 daily       Allergies:  Allergies  Allergen  Reactions  . Viagra [Sildenafil Citrate]     Past Medical History:  Diagnosis Date  . Carpal tunnel syndrome   . Depression   . Diabetes mellitus without complication ([place])   . ED (erectile dysfunction)   . Edema   . Hyperlipidemia   . Hypertension   . Lumbar disc disease   . Neuropathy   . Stroke [facility])     Past Surgical History:  Procedure Laterality  Date  . BACK SURGERY    . VASECTOMY      Family History  Problem Relation Age of Onset  . Cancer Father        Prostate  . Diabetes Neg Hx     Social History:  reports that he has never smoked. He has never used smokeless tobacco. He reports that he does not drink alcohol or use drugs.  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   Currently on AndroGel 5 g packets, prescribed 1- 1-1/2 packets daily He has been taking this more regularly No unusual fatigue Testosterone level as follows:   Lab Results  Component Value Date   TESTOSTERONE 333.79 10/30/2017     HYPERTENSION: Controlled with taking Invokana in addition to Benicar 5 mg along with  5 mg amlodipine      BP Readings from Last 3 Encounters:  02/01/18 138/60  11/02/17 126/64  10/08/17 132/78      His creatinine tends to be upper normal and not improved with reducing Benicar  Lasix was reduced to every other day with starting  Invokana   Lab Results  Component Value Date   CREATININE 1.54 (H) 01/29/2018   CREATININE 1.53 (H) 10/30/2017   CREATININE 1.42 07/31/2017     Hypercholesterolemia: Well controlled on Lipitor 20 mg    Lab Results  Component Value Date   CHOL 119 10/30/2017   HDL 30.90 (L) 10/30/2017   LDLCALC 65 10/30/2017   LDLDIRECT 67.0 08/02/2015   TRIG 116.0 10/30/2017   CHOLHDL 4 10/30/2017    Has history of retinopathy, has regular follow-up with specialist    EXAM:   BP 138/60 (BP Location: Left Arm, Patient Position: Sitting, Cuff Size: Normal)   Pulse 75   Ht 5\' 7"  (1.702 m)   Wt 286 lb 6.4 oz (129.9 kg)   SpO2 95%   BMI 44.86 kg/m     ASSESSMENT/PLAN:  DIABETES Type I and obesity:  See history of present illness for detailed discussion on current blood sugar patterns and problems identified  His A1c is 6.9 which is now consistently improved  With his better management of his bolus insulin using the Dexcom for guidance he has been able to generally keep his blood sugars controlled However blood sugars are significantly high after evening meal possibly from being less active at that time or more frequent carbohydrate meals and snacks He appears to be more resistant to insulin and continues to be significantly obese  With his renal function being borderline his Invokana is only marginally effective now  Although he had previously not significantly benefit from Victoza he still may benefit from a trial of a GLP-1 For simplicity will have him do a once a week Ozempic  Discussed with the patient the nature of GLP-1 drugs, the action on various organ systems, improved satiety,  how they benefit blood glucose control, as well as the benefit of weight loss. Explained possible side effects of OZEMPIC, most commonly nausea that usually improves over time; discussed safety information in package insert.  Demonstrated the medication injection device and injection technique to the  patient.  Showed patient possible injection sites To start with 0.25mg  dosage weekly for the first 4 injections and then 0.5 mg If this is effective we can probably stop Invokana  Patient brochure on Ozempic with enclosed co-pay card given  Also will increase his carbohydrate coverage to 1: 4 at suppertime  Encouraged him to start exercise He will need to keep the same active insulin time and discussed insulin on board concept   HYPERTENSION: Blood pressure is controlled even with only 5 mg Benicar  Renal function: This is about the same and mildly affected unable to reduce his diuretics because of tendency to edema   Counseling time on subjects discussed in assessment and plan sections is over 50% of today's 25 minute visit   There are no Patient Instructions on file for this visit.     Elayne Snare 02/01/2018

## 2018-02-01 ENCOUNTER — Ambulatory Visit: Payer: Medicare Other | Admitting: Endocrinology

## 2018-02-01 ENCOUNTER — Encounter: Payer: Self-pay | Admitting: Endocrinology

## 2018-02-01 VITALS — BP 138/60 | HR 75 | Ht 67.0 in | Wt 286.4 lb

## 2018-02-01 DIAGNOSIS — E291 Testicular hypofunction: Secondary | ICD-10-CM | POA: Diagnosis not present

## 2018-02-01 DIAGNOSIS — N289 Disorder of kidney and ureter, unspecified: Secondary | ICD-10-CM

## 2018-02-01 DIAGNOSIS — E1165 Type 2 diabetes mellitus with hyperglycemia: Secondary | ICD-10-CM

## 2018-02-01 MED ORDER — SEMAGLUTIDE(0.25 OR 0.5MG/DOS) 2 MG/1.5ML ~~LOC~~ SOPN: 0.5000 mg | PEN_INJECTOR | SUBCUTANEOUS | 2 refills | Status: DC

## 2018-02-01 NOTE — Patient Instructions (Signed)
Carb ratio 1:4 after 5 pm  Start OZEMPIC injections by dialing 0.25 mg on the pen as shown once weekly on the same day of the week.   You may inject in the sides of the stomach, outer thigh or arm as indicated in the brochure given. If you have any difficulties using the pen see the video at [employer].[email or web address]  You will feel fullness of the stomach with starting the medication and should try to keep the portions at meals small.  You may experience nausea in the first few days which usually gets better over time    After 4 weeks increase the dose to 0.5 mg weekly  If you have any questions or persistent side effects please call the office   You may also talk to a nurse educator with [employer] at [phone] Useful website: [email or web address]   Stop Invokana after 3rd injection

## 2018-02-07 ENCOUNTER — Other Ambulatory Visit: Payer: Self-pay | Admitting: Endocrinology

## 2018-02-09 ENCOUNTER — Other Ambulatory Visit: Payer: Self-pay | Admitting: Endocrinology

## 2018-03-07 ENCOUNTER — Other Ambulatory Visit: Payer: Self-pay | Admitting: Endocrinology

## 2018-03-14 ENCOUNTER — Other Ambulatory Visit: Payer: Self-pay | Admitting: Endocrinology

## 2018-03-20 ENCOUNTER — Other Ambulatory Visit: Payer: Self-pay | Admitting: Endocrinology

## 2018-05-01 ENCOUNTER — Other Ambulatory Visit (INDEPENDENT_AMBULATORY_CARE_PROVIDER_SITE_OTHER): Payer: Medicare Other

## 2018-05-01 ENCOUNTER — Other Ambulatory Visit: Payer: Self-pay

## 2018-05-01 DIAGNOSIS — E291 Testicular hypofunction: Secondary | ICD-10-CM

## 2018-05-01 DIAGNOSIS — E1165 Type 2 diabetes mellitus with hyperglycemia: Secondary | ICD-10-CM

## 2018-05-01 LAB — COMPREHENSIVE METABOLIC PANEL
ALBUMIN: 3.9 g/dL (ref 3.5–5.2)
ALT: 23 U/L (ref 0–53)
AST: 16 U/L (ref 0–37)
Alkaline Phosphatase: 73 U/L (ref 39–117)
BUN: 28 mg/dL — AB (ref 6–23)
CO2: 26 mEq/L (ref 19–32)
Calcium: 8.8 mg/dL (ref 8.4–10.5)
Chloride: 104 mEq/L (ref 96–112)
Creatinine, Ser: 1.56 mg/dL — ABNORMAL HIGH (ref 0.40–1.50)
GFR: 44.17 mL/min — ABNORMAL LOW (ref >60.00)
Glucose, Bld: 225 mg/dL — ABNORMAL HIGH (ref 70–99)
Potassium: 4.1 mEq/L (ref 3.5–5.1)
SODIUM: 139 meq/L (ref 135–145)
Total Bilirubin: 0.3 mg/dL (ref 0.2–1.2)
Total Protein: 6.4 g/dL (ref 6.0–8.3)

## 2018-05-01 LAB — CBC
HCT: 42.1 % (ref 39.0–52.0)
Hemoglobin: 14.2 g/dL (ref 13.0–17.0)
MCHC: 33.8 g/dL (ref 30.0–36.0)
MCV: 88.6 fl (ref 78.0–100.0)
Platelets: 203 10*3/uL (ref 150.0–400.0)
RBC: 4.75 Mil/uL (ref 4.22–5.81)
RDW: 14.1 % (ref 11.5–15.5)
WBC: 7 10*3/uL (ref 4.0–10.5)

## 2018-05-01 LAB — TESTOSTERONE: Testosterone: 305.25 ng/dL (ref 300.00–890.00)

## 2018-05-01 LAB — HEMOGLOBIN A1C: Hgb A1c MFr Bld: 6.8 % — ABNORMAL HIGH (ref 4.6–6.5)

## 2018-05-03 ENCOUNTER — Ambulatory Visit: Payer: Medicare Other | Admitting: Endocrinology

## 2018-05-03 ENCOUNTER — Encounter: Payer: Self-pay | Admitting: Endocrinology

## 2018-05-03 ENCOUNTER — Other Ambulatory Visit: Payer: Self-pay

## 2018-05-03 VITALS — BP 142/60 | HR 74 | Ht 67.0 in | Wt 279.2 lb

## 2018-05-03 DIAGNOSIS — N183 Chronic kidney disease, stage 3 unspecified: Secondary | ICD-10-CM

## 2018-05-03 DIAGNOSIS — E291 Testicular hypofunction: Secondary | ICD-10-CM | POA: Diagnosis not present

## 2018-05-03 DIAGNOSIS — E1065 Type 1 diabetes mellitus with hyperglycemia: Secondary | ICD-10-CM | POA: Diagnosis not present

## 2018-05-03 NOTE — Patient Instructions (Addendum)
Stop Invokana  Use Androgel daily

## 2018-05-03 NOTE — Progress Notes (Signed)
Patient ID: Paul Adkins, male   DOB: [birth year], 73 y.o.   MRN: [id number]    Reason for visit:   follow-up  Diagnosis: Type 1 diabetes, date of onset 1978  PAST history: He has had persistently poorly controlled diabetes for several years. A1c has been high with usual range 8.2 -9, overall improved since 2013. Compliance with glucose monitoring and diet has been variable and he does better when he is checking more sugars. His A1c was better than usual in 3/14 at 7.5 but he was also having significant hypoglycemia overnight and also occasionally later in the day. At that time he was started on Invokana 300 mg daily which helped him with glucose control and mild weight loss  CURRENT insulin pump brand:  Medtronic   PUMP SETTINGS are:   Basal rates: 2.5 from midnight- 4 AM. 4 AM = 2.9.  8 AM = 2.75.  Total basal insulin 66 units Boluses : 1 unit for 4 g carbs at mealtimes  Glucose target 110-120, sensitivity 1: 20 with active insulin 2hours.  Changing infusion set every 3 days  RECENT history:   His A1c is still relatively good at 6.8   CONTINUOUS GLUCOSE MONITORING RECORD INTERPRETATION    Dates of Recording: 2/22 through 05/03/2018  Sensor description: Dexcom G6  Results statistics:   CGM use % of time  99  Average and SD  146+/-50, was 150  Time in range    77    %  % Time Above 180  22  % Time above 250  4  % Time Below target 2    Glycemic patterns summary: There is moderate variability in his blood sugars but relatively less in the mornings and early afternoon and more significantly overnight.  Has not had excessive hypoglycemia Average on his best blood sugar today was 129  Hyperglycemic episodes these do not occur at any consistent time occasional high sugar spikes overnight, evening after 6 PM and occasionally afternoon, also may have some rebound from low sugars at times  Hypoglycemic episodes occurred rarely with any symptomatic low sugar at 2 AM as  well as transient low sugars midday, less significant in the last week  Overnight periods: Blood sugars periodically will be higher around 4-5 AM which he thinks is from eating during the night even though he is bolusing.  Also blood sugars are inconsistent and averaging between 140-175  Preprandial periods: Overall blood sugars are the lowest at breakfast and lunch time with only occasional sugars and around 150 at dinnertime  Postprandial periods: After breakfast: Blood sugars are likely flat After lunch: May occasionally spike excessively but not usually After dinner: Sugars on average and fairly flat with occasional high readings based on his intake and boluses and occasionally over 200 about 1-2 weeks ago  Current blood sugar patterns and problems:  He has lost weight with starting Ozempic  He is retired and is not as active  He says that he may be sleeping at different times now and sometimes during the day also  He tends to over treat low blood sugars with rebound  Has had only one very significantly high blood sugar overnight possibly from infusion set problem   Changing infusion set every 3-4 days   Physical activity: none, previously was exercising on the bike   Wt Readings from Last 3 Encounters:  05/03/18 279 lb 3.2 oz (126.6 kg)  02/01/18 286 lb 6.4 oz (129.9 kg)  11/02/17 281 lb (127.5 kg)  Lab Results  Component Value Date   HGBA1C 6.8 (H) 05/01/2018   HGBA1C 6.9 (H) 01/29/2018   HGBA1C 6.7 (H) 10/30/2017   Lab Results  Component Value Date   MICROALBUR 0.8 07/31/2017   LDLCALC 65 10/30/2017   CREATININE 1.56 (H) 05/01/2018    OTHER problems addressed today are in review of systems    Lab on 05/01/2018  Component Date Value Ref Range Status  . WBC 05/01/2018 7.0  4.0 - 10.5 K/uL Final  . RBC 05/01/2018 4.75  4.22 - 5.81 Mil/uL Final  . Platelets 05/01/2018 203.0  150.0 - 400.0 K/uL Final  . Hemoglobin 05/01/2018 14.2  13.0 - 17.0 g/dL Final  .  HCT 05/01/2018 42.1  39.0 - 52.0 % Final  . MCV 05/01/2018 88.6  78.0 - 100.0 fl Final  . MCHC 05/01/2018 33.8  30.0 - 36.0 g/dL Final  . RDW 05/01/2018 14.1  11.5 - 15.5 % Final  . Testosterone 05/01/2018 305.25  300.00 - 890.00 ng/dL Final  . Sodium 05/01/2018 139  135 - 145 mEq/L Final  . Potassium 05/01/2018 4.1  3.5 - 5.1 mEq/L Final  . Chloride 05/01/2018 104  96 - 112 mEq/L Final  . CO2 05/01/2018 26  19 - 32 mEq/L Final  . Glucose, Bld 05/01/2018 225* 70 - 99 mg/dL Final  . BUN 05/01/2018 28* 6 - 23 mg/dL Final  . Creatinine, Ser 05/01/2018 1.56* 0.40 - 1.50 mg/dL Final  . Total Bilirubin 05/01/2018 0.3  0.2 - 1.2 mg/dL Final  . Alkaline Phosphatase 05/01/2018 73  39 - 117 U/L Final  . AST 05/01/2018 16  0 - 37 U/L Final  . ALT 05/01/2018 23  0 - 53 U/L Final  . Total Protein 05/01/2018 6.4  6.0 - 8.3 g/dL Final  . Albumin 05/01/2018 3.9  3.5 - 5.2 g/dL Final  . Calcium 05/01/2018 8.8  8.4 - 10.5 mg/dL Final  . GFR 05/01/2018 44.17* >60.00 mL/min Final  . Hgb A1c MFr Bld 05/01/2018 6.8* 4.6 - 6.5 % Final   Glycemic Control Guidelines for People with Diabetes:Non Diabetic:  <6%Goal of Therapy: <7%Additional Action Suggested:  >8%     Allergies as of 05/03/2018      Reactions   Viagra [sildenafil Citrate]       Medication List       Accurate as of May 03, 2018  8:37 AM. Always use your most recent med list.        amLODipine 5 MG tablet Commonly known as:  NORVASC TAKE 1 TABLET BY MOUTH EVERY DAY   aspirin 81 MG tablet Take 81 mg by mouth daily.   atorvastatin 20 MG tablet Commonly known as:  LIPITOR TAKE 1 TABLET BY MOUTH EVERY DAY   furosemide 20 MG tablet Commonly known as:  LASIX TAKE 1 TABLET BY MOUTH EVERY DAY   glucose blood test strip Commonly known as:  Visual merchandiser Next Test Use as instructed to check blood sugars 4 times per day dx code E10.65   HumaLOG 100 UNIT/ML injection Generic drug:  insulin lispro USE MAX 110 UNITS PER DAY WITH INSULIN  PUMP   Invokana 100 MG Tabs tablet Generic drug:  canagliflozin TAKE 1 TABLET BEFORE BREAKFAST   methylphenidate 10 MG CR capsule Commonly known as:  Metadate CD Take 1 capsule (10 mg total) by mouth every morning.   olmesartan 5 MG tablet Commonly known as:  BENICAR TAKE 1 TABLET BY MOUTH EVERY DAY   PARoxetine 40 MG  tablet Commonly known as:  PAXIL TAKE 1 TABLET (40 MG TOTAL) BY MOUTH AT BEDTIME.   Semaglutide(0.25 or 0.5MG /DOS) 2 MG/1.5ML Sopn Commonly known as:  Ozempic (0.25 or 0.5 MG/DOSE) Inject 0.5 mg into the skin once a week.   testosterone 50 MG/5GM (1%) Gel Commonly known as:  ANDROGEL APPLY 1 AND 1/2 PACKETS ONTO SKIN DAILY   Vitamin D 50 MCG (2000 UT) Caps Take by mouth. Takes 6000 daily       Allergies:  Allergies  Allergen Reactions  . Viagra [Sildenafil Citrate]     Past Medical History:  Diagnosis Date  . Carpal tunnel syndrome   . Depression   . Diabetes mellitus without complication ([place])   . ED (erectile dysfunction)   . Edema   . Hyperlipidemia   . Hypertension   . Lumbar disc disease   . Neuropathy   . Stroke [facility])     Past Surgical History:  Procedure Laterality Date  . BACK SURGERY    . VASECTOMY      Family History  Problem Relation Age of Onset  . Cancer Father        Prostate  . Diabetes Neg Hx     Social History:  reports that he has never smoked. He has never used smokeless tobacco. He reports that he does not drink alcohol or use drugs.  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   Currently on AndroGel 5 g packets, prescribed 1- 1-1/2 packets daily He has been taking this irregularly recently No unusual fatigue except on the days he does not take his testosterone Testosterone level as follows:   Lab Results  Component Value Date   TESTOSTERONE 305.25 05/01/2018     HYPERTENSION: Controlled with  Benicar 5 mg along with  5 mg amlodipine    BP Readings from Last 3 Encounters:  05/03/18 (!) 142/60  02/01/18 138/60  11/02/17 126/64      His creatinine tends to be slightly above normal and not improved with reducing Benicar No microalbuminuria   Lab Results  Component Value Date   CREATININE 1.56 (H) 05/01/2018   CREATININE 1.54 (H) 01/29/2018   CREATININE 1.53 (H) 10/30/2017     Hypercholesterolemia: Well controlled on Lipitor 20 mg    Lab Results  Component Value Date   CHOL 119 10/30/2017   HDL 30.90 (L) 10/30/2017   LDLCALC 65 10/30/2017   LDLDIRECT 67.0 08/02/2015   TRIG 116.0 10/30/2017   CHOLHDL 4 10/30/2017    Has history of retinopathy, has regular follow-up with specialist    EXAM:   BP (!) 142/60 (BP Location: Left Arm, Patient Position: Sitting, Cuff Size: Large)   Pulse 74   Ht 5\' 7"  (1.702 m)   Wt 279 lb 3.2 oz (126.6 kg)   SpO2 97%  BMI 43.73 kg/m     ASSESSMENT/PLAN:  DIABETES Type I and obesity:  See history of present illness for detailed discussion on current blood sugar patterns and problems identified  His A1c is 6.8 which is now consistently below 7  He has mostly benefited from using the Dexcom sensor that helps him with compliance with diet, bolusing as well as being alerted to high readings As discussed above most of his hyperglycemia is during the night when he may need to be getting more  snacks Also currently no consistent pattern of hyperglycemia seen and he has not had any excessive hypoglycemia He can do better with exercise Also probably with somewhat reduced portions he has lost some weight with adding Ozempic which he is doing well with and he will continue 0.5 mg weekly Likely not benefiting from [place] with his creatinine clearance only about 44 and will stop this for now  Encouraged him to start exercise with his exercise bike or do other exercises  HYPOGONADISM: He is not regular with his testosterone gel and agrees that he will feel better if he uses this daily   HYPERTENSION: Blood pressure is upper normal and will continue to monitor  Renal dysfunction: This is about the same and not clear of the etiology Will leave off the Invokana  Depression: He is having adjustment issues with his retirement and encouraged him to be active and he is going to try to get more social interactions  Counseling time on subjects discussed in assessment and plan sections is over 50% of today's 25 minute visit   There are no Patient Instructions on file for this visit.     Elayne Snare 05/03/2018

## 2018-05-05 ENCOUNTER — Other Ambulatory Visit: Payer: Self-pay | Admitting: Endocrinology

## 2018-05-06 ENCOUNTER — Other Ambulatory Visit: Payer: Self-pay

## 2018-05-06 ENCOUNTER — Telehealth: Payer: Self-pay

## 2018-05-06 MED ORDER — SEMAGLUTIDE(0.25 OR 0.5MG/DOS) 2 MG/1.5ML ~~LOC~~ SOPN: 0.5000 mg | PEN_INJECTOR | SUBCUTANEOUS | 3 refills | Status: DC

## 2018-05-06 MED ORDER — PAROXETINE HCL 40 MG PO TABS: 40.0000 mg | ORAL_TABLET | Freq: Every day | ORAL | 5 refills | Status: DC

## 2018-05-06 NOTE — Telephone Encounter (Signed)
Patient requesting refill on paroxetine

## 2018-05-06 NOTE — Telephone Encounter (Signed)
Rx sent 

## 2018-05-28 ENCOUNTER — Other Ambulatory Visit: Payer: Self-pay | Admitting: Endocrinology

## 2018-06-08 ENCOUNTER — Other Ambulatory Visit: Payer: Self-pay | Admitting: Endocrinology

## 2018-06-09 ENCOUNTER — Other Ambulatory Visit: Payer: Self-pay | Admitting: Endocrinology

## 2018-06-10 NOTE — Telephone Encounter (Signed)
Please refill if appropriate

## 2018-06-11 ENCOUNTER — Other Ambulatory Visit: Payer: Self-pay | Admitting: Endocrinology

## 2018-06-13 ENCOUNTER — Other Ambulatory Visit: Payer: Self-pay | Admitting: Endocrinology

## 2018-06-19 ENCOUNTER — Telehealth: Payer: Self-pay | Admitting: Endocrinology

## 2018-06-19 NOTE — Telephone Encounter (Signed)
His prescription calls for 110 units daily.  Please check to see what the question is

## 2018-06-19 NOTE — Telephone Encounter (Signed)
Patient states that his last visit with Dr.Kumar that they talked about increasing HUMALOG 100 UNIT/ML injection but the refill that was sent his is old dosage.  Please Advise, Thanks

## 2018-06-19 NOTE — Telephone Encounter (Signed)
I do not see an increase in insulin dose in your last office visit note.

## 2018-06-20 ENCOUNTER — Other Ambulatory Visit: Payer: Self-pay

## 2018-06-20 MED ORDER — INSULIN LISPRO 100 UNIT/ML ~~LOC~~ SOLN: SUBCUTANEOUS | 3 refills | Status: DC

## 2018-06-20 NOTE — Telephone Encounter (Signed)
Rx sent for 170 units daily via insulin pump per MD instructions

## 2018-06-30 ENCOUNTER — Other Ambulatory Visit: Payer: Self-pay | Admitting: Endocrinology

## 2018-07-10 ENCOUNTER — Other Ambulatory Visit: Payer: Self-pay

## 2018-07-10 ENCOUNTER — Encounter (INDEPENDENT_AMBULATORY_CARE_PROVIDER_SITE_OTHER): Payer: Medicare Other | Admitting: Ophthalmology

## 2018-07-10 DIAGNOSIS — E113592 Type 2 diabetes mellitus with proliferative diabetic retinopathy without macular edema, left eye: Secondary | ICD-10-CM | POA: Diagnosis not present

## 2018-07-10 DIAGNOSIS — D3132 Benign neoplasm of left choroid: Secondary | ICD-10-CM

## 2018-07-10 DIAGNOSIS — E113391 Type 2 diabetes mellitus with moderate nonproliferative diabetic retinopathy without macular edema, right eye: Secondary | ICD-10-CM

## 2018-07-10 DIAGNOSIS — I10 Essential (primary) hypertension: Secondary | ICD-10-CM | POA: Diagnosis not present

## 2018-07-10 DIAGNOSIS — H43813 Vitreous degeneration, bilateral: Secondary | ICD-10-CM

## 2018-07-10 DIAGNOSIS — H35033 Hypertensive retinopathy, bilateral: Secondary | ICD-10-CM

## 2018-07-10 DIAGNOSIS — E11319 Type 2 diabetes mellitus with unspecified diabetic retinopathy without macular edema: Secondary | ICD-10-CM | POA: Diagnosis not present

## 2018-07-21 IMAGING — MR MR HEAD W/O CM
8 of 10 series · 39 of 48 positions shown · non-contrast
Comparison: None.

CLINICAL DATA: Acute onset vertigo

EXAM:
MRI HEAD WITHOUT CONTRAST
TECHNIQUE: Multiplanar, multiecho pulse sequences of the brain and surrounding
structures were obtained without intravenous contrast.

[Series 3: T1 · sagittal · 5.0mm · 0.47mm/px · 1 of 24 slices shown]
[im 1/24]
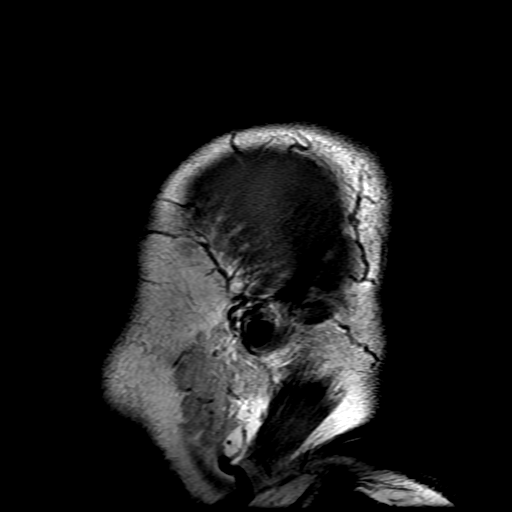

[Series 4: DWI · axial · 3.0mm · 1.09mm/px · z∈[-39,+122]mm · 11 of 110 slices shown (1 of 4)]
[im 1/110]
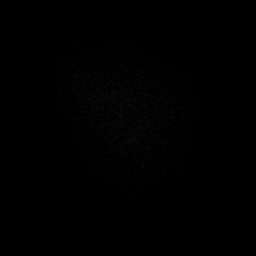
[im 11/110]
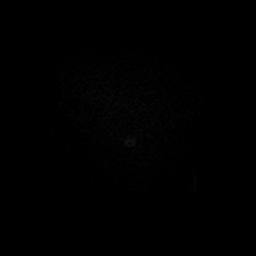
[im 22/110]
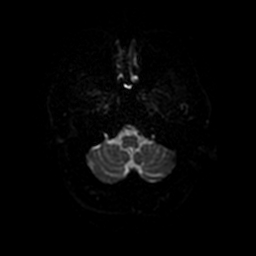
[im 33/110]
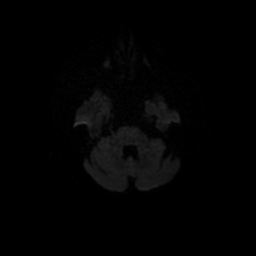
[im 44/110]
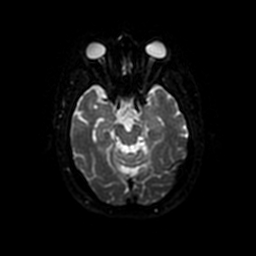
[im 55/110]
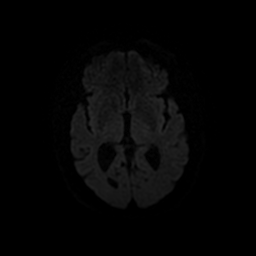
[im 66/110]
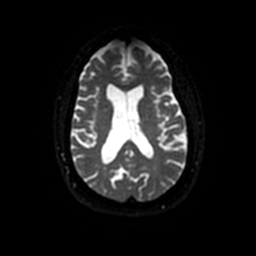
[im 77/110]
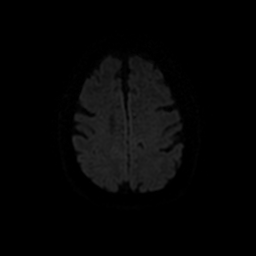
[im 88/110]
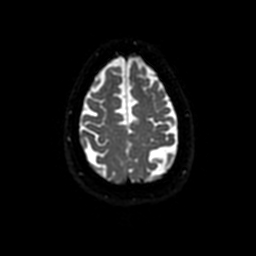
[im 99/110]
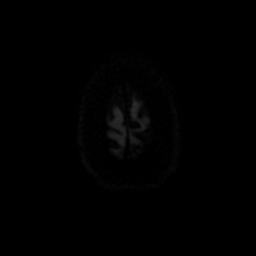
[im 110/110]
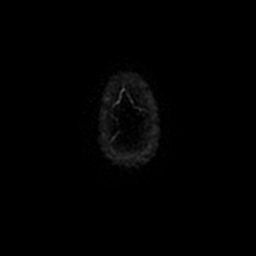

[Series 5: DWI · coronal · 5.0mm · 1.09mm/px · 8 of 74 slices shown (2 of 4)]
[im 1/74]
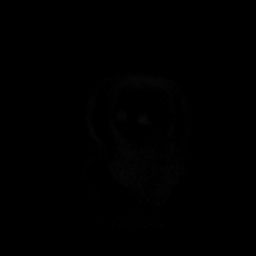
[im 11/74]
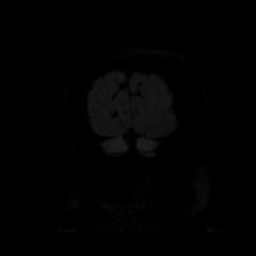
[im 21/74]
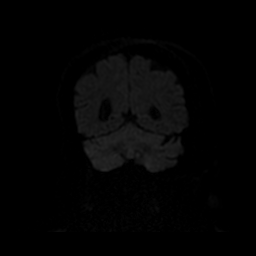
[im 32/74]
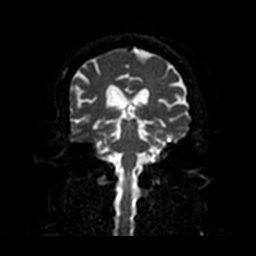
[im 42/74]
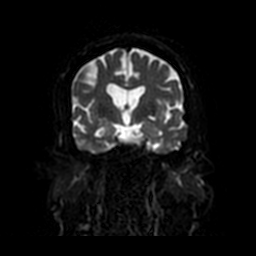
[im 53/74]
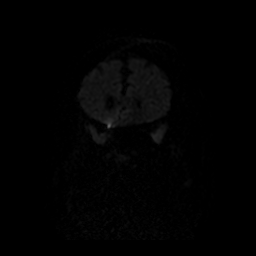
[im 63/74]
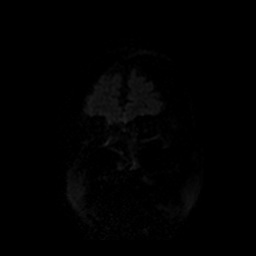
[im 74/74]
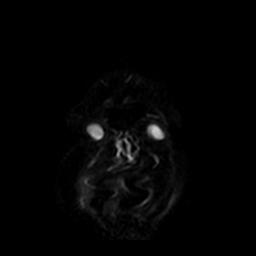

[Series 6: T2 · axial · 5.0mm · 0.45mm/px · z∈[-40,+121]mm · 3 of 26 slices shown (1 of 2)]
[im 1/26]
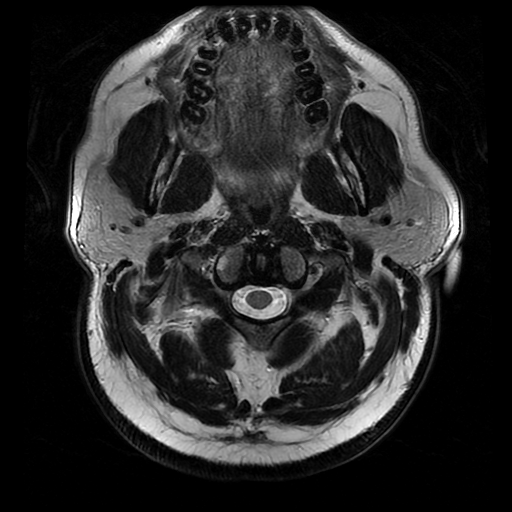
[im 13/26]
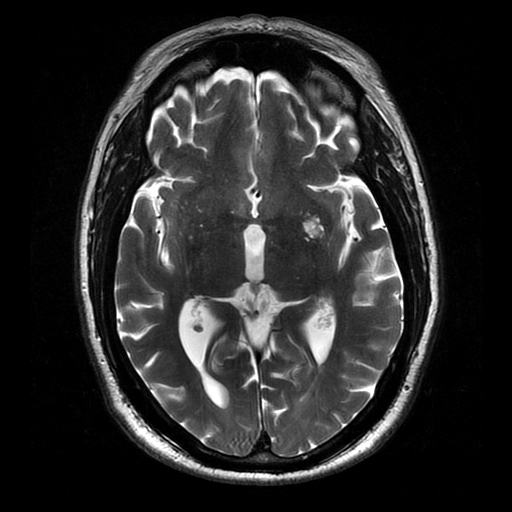
[im 26/26]
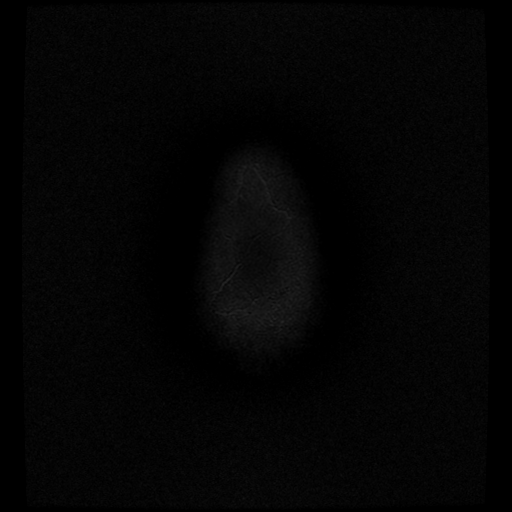

[Series 7: FLAIR · axial · 5.0mm · 0.45mm/px · z∈[-40,+121]mm · 3 of 26 slices shown]
[im 1/26]
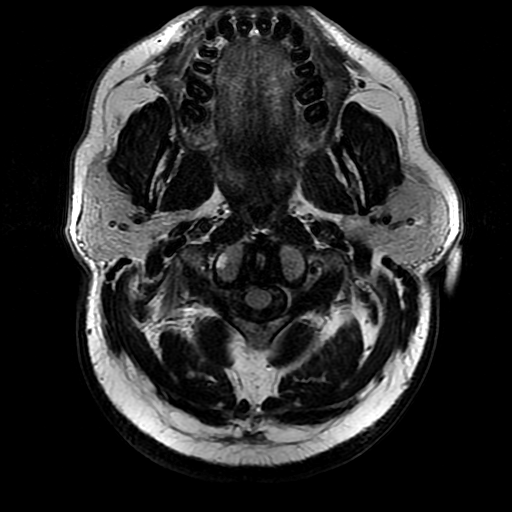
[im 13/26]
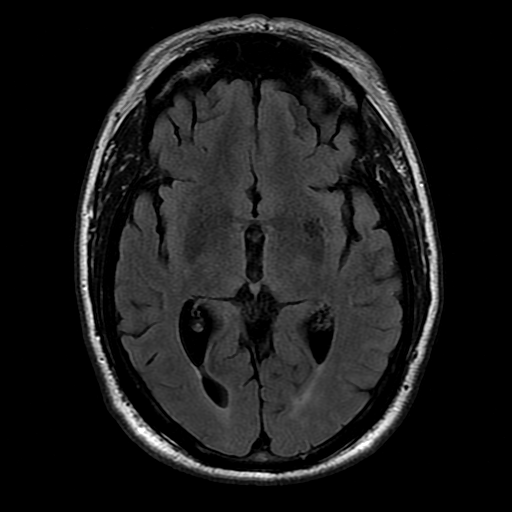
[im 26/26]
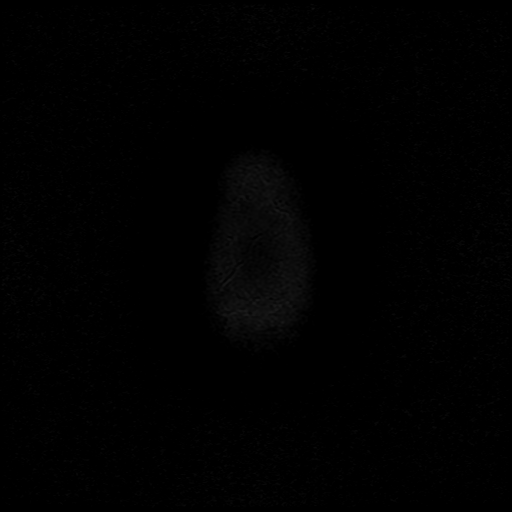

[Series 10: T2 · coronal · 5.0mm · 0.45mm/px · 3 of 28 slices shown (2 of 2)]
[im 1/28]
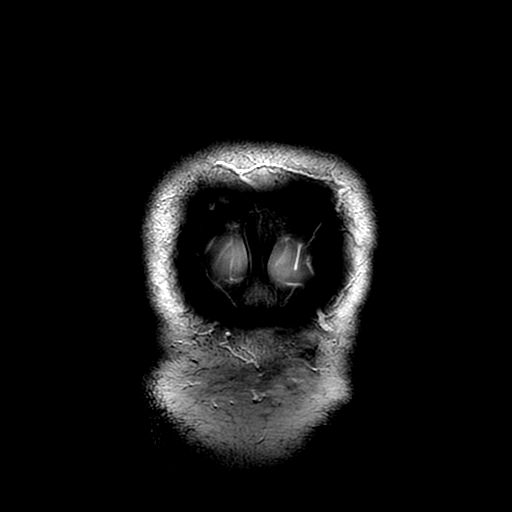
[im 14/28]
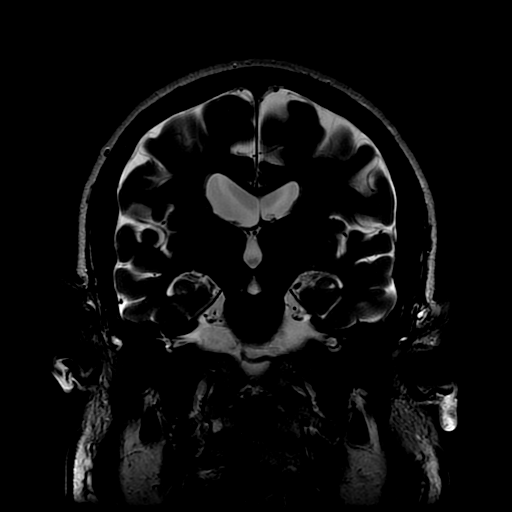
[im 28/28]
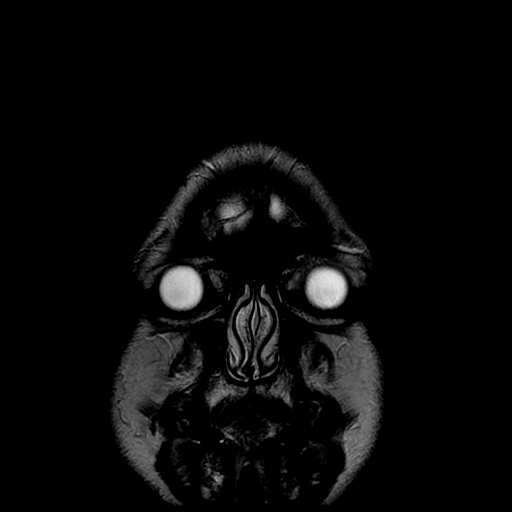

[Series 400: DWI · axial · 3.0mm · 1.09mm/px · z∈[-39,+122]mm · 6 of 55 slices shown (3 of 4)]
[im 1/55]
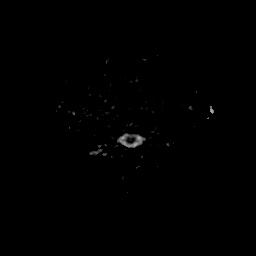
[im 11/55]
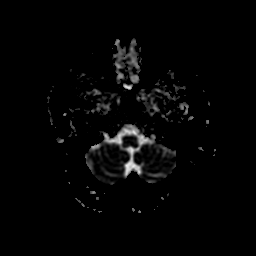
[im 22/55]
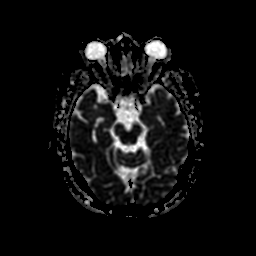
[im 33/55]
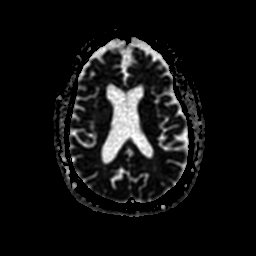
[im 44/55]
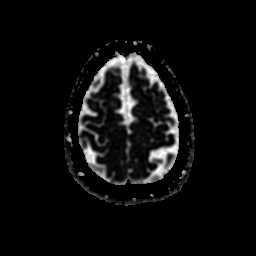
[im 55/55]
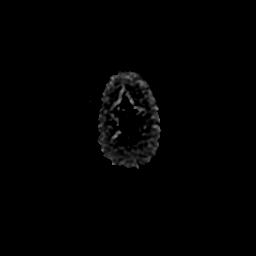

[Series 500: DWI · coronal · 5.0mm · 1.09mm/px · 4 of 37 slices shown (4 of 4)]
[im 1/37]
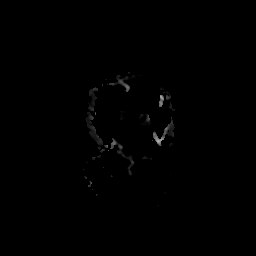
[im 13/37]
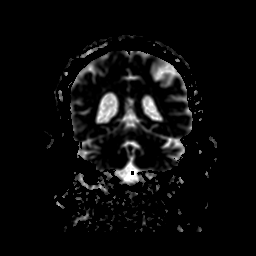
[im 25/37]
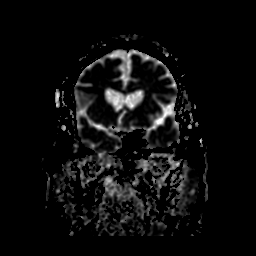
[im 37/37]
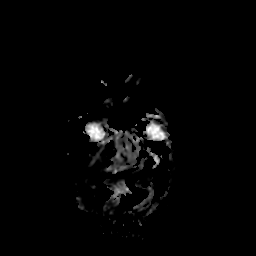

[39 of 48 positions shown; findings below may reference images not displayed]

FINDINGS: Brain Parenchyma: No acute infarct or intraparenchymal hemorrhage.
Mild periventricular hyperintensity. Prominent left lenticulostriate
perivascular spaces. No mass lesion or midline shift. The major
intracranial flow voids are preserved. The midline structures are
normal.

Ventricles, Sulci and Extra-axial Spaces: Normal for age. No
extra-axial collection.

Paranasal Sinuses and Mastoids: No fluid levels or advanced mucosal
thickening. Small cystic focus within the nasopharynx.

Orbits: Normal.

Bones and Soft Tissues: The visualized skull base, calvarium and
extracranial soft tissues are normal.
IMPRESSION: No acute intracranial abnormality. No finding to explain the
patient's vertigo.

## 2018-07-23 ENCOUNTER — Other Ambulatory Visit: Payer: Self-pay

## 2018-07-30 ENCOUNTER — Other Ambulatory Visit: Payer: Self-pay

## 2018-07-30 ENCOUNTER — Other Ambulatory Visit (INDEPENDENT_AMBULATORY_CARE_PROVIDER_SITE_OTHER): Payer: Medicare Other

## 2018-07-30 DIAGNOSIS — E1065 Type 1 diabetes mellitus with hyperglycemia: Secondary | ICD-10-CM

## 2018-07-30 DIAGNOSIS — E291 Testicular hypofunction: Secondary | ICD-10-CM | POA: Diagnosis not present

## 2018-07-30 DIAGNOSIS — N183 Chronic kidney disease, stage 3 unspecified: Secondary | ICD-10-CM

## 2018-07-30 LAB — URINALYSIS, ROUTINE W REFLEX MICROSCOPIC
Bilirubin Urine: NEGATIVE
Hgb urine dipstick: NEGATIVE
Ketones, ur: NEGATIVE
Leukocytes,Ua: NEGATIVE
Nitrite: NEGATIVE
RBC / HPF: NONE SEEN (ref 0–?)
Specific Gravity, Urine: >=1.03 — AB (ref 1.000–1.030)
Total Protein, Urine: NEGATIVE
Urine Glucose: NEGATIVE
Urobilinogen, UA: 1 (ref 0.0–1.0)
pH: 5.5 (ref 5.0–8.0)

## 2018-07-30 LAB — MICROALBUMIN / CREATININE URINE RATIO
Creatinine,U: 140.7 mg/dL
Microalb Creat Ratio: 1.4 mg/g (ref 0.0–30.0)
Microalb, Ur: 1.9 mg/dL (ref 0.0–1.9)

## 2018-07-30 LAB — COMPREHENSIVE METABOLIC PANEL
ALT: 31 U/L (ref 0–53)
AST: 20 U/L (ref 0–37)
Albumin: 3.5 g/dL (ref 3.5–5.2)
Alkaline Phosphatase: 71 U/L (ref 39–117)
BUN: 22 mg/dL (ref 6–23)
CO2: 28 mEq/L (ref 19–32)
Calcium: 8.5 mg/dL (ref 8.4–10.5)
Chloride: 107 mEq/L (ref 96–112)
Creatinine, Ser: 1.25 mg/dL (ref 0.40–1.50)
GFR: 57 mL/min — ABNORMAL LOW (ref >60.00)
Glucose, Bld: 178 mg/dL — ABNORMAL HIGH (ref 70–99)
Potassium: 4.3 mEq/L (ref 3.5–5.1)
Sodium: 141 mEq/L (ref 135–145)
Total Bilirubin: 0.3 mg/dL (ref 0.2–1.2)
Total Protein: 5.8 g/dL — ABNORMAL LOW (ref 6.0–8.3)

## 2018-07-30 LAB — CBC
HCT: 37.3 % — ABNORMAL LOW (ref 39.0–52.0)
Hemoglobin: 12.8 g/dL — ABNORMAL LOW (ref 13.0–17.0)
MCHC: 34.2 g/dL (ref 30.0–36.0)
MCV: 89.4 fl (ref 78.0–100.0)
Platelets: 192 10*3/uL (ref 150.0–400.0)
RBC: 4.17 Mil/uL — ABNORMAL LOW (ref 4.22–5.81)
RDW: 14.3 % (ref 11.5–15.5)
WBC: 4.8 10*3/uL (ref 4.0–10.5)

## 2018-07-30 LAB — TESTOSTERONE: Testosterone: 362.81 ng/dL (ref 300.00–890.00)

## 2018-07-30 LAB — TSH: TSH: 2.7 u[IU]/mL (ref 0.35–4.50)

## 2018-07-30 LAB — LIPID PANEL
Cholesterol: 120 mg/dL (ref 0–200)
HDL: 33.2 mg/dL — ABNORMAL LOW (ref >39.00)
LDL Cholesterol: 49 mg/dL (ref 0–99)
NonHDL: 86.32
Total CHOL/HDL Ratio: 4
Triglycerides: 189 mg/dL — ABNORMAL HIGH (ref 0.0–149.0)
VLDL: 37.8 mg/dL (ref 0.0–40.0)

## 2018-07-30 LAB — HEMOGLOBIN A1C: Hgb A1c MFr Bld: 7.2 % — ABNORMAL HIGH (ref 4.6–6.5)

## 2018-08-02 ENCOUNTER — Other Ambulatory Visit: Payer: Self-pay

## 2018-08-02 ENCOUNTER — Ambulatory Visit: Payer: Medicare Other | Admitting: Endocrinology

## 2018-08-02 ENCOUNTER — Encounter: Payer: Self-pay | Admitting: Endocrinology

## 2018-08-02 VITALS — BP 130/70 | HR 84 | Ht 67.0 in | Wt 279.6 lb

## 2018-08-02 DIAGNOSIS — N289 Disorder of kidney and ureter, unspecified: Secondary | ICD-10-CM | POA: Diagnosis not present

## 2018-08-02 DIAGNOSIS — I10 Essential (primary) hypertension: Secondary | ICD-10-CM | POA: Diagnosis not present

## 2018-08-02 DIAGNOSIS — E1065 Type 1 diabetes mellitus with hyperglycemia: Secondary | ICD-10-CM

## 2018-08-02 DIAGNOSIS — E559 Vitamin D deficiency, unspecified: Secondary | ICD-10-CM

## 2018-08-02 DIAGNOSIS — E291 Testicular hypofunction: Secondary | ICD-10-CM | POA: Diagnosis not present

## 2018-08-02 NOTE — Progress Notes (Signed)
Patient ID: Paul Adkins, male   DOB: [birth year], 73 y.o.   MRN: [id number]    Reason for visit:   follow-up  Diagnosis: Type 1 diabetes, date of onset 1978  PAST history: He has had persistently poorly controlled diabetes for several years. A1c has been high with usual range 8.2 -9, overall improved since 2013. Compliance with glucose monitoring and diet has been variable and he does better when he is checking more sugars. His A1c was better than usual in 3/14 at 7.5 but he was also having significant hypoglycemia overnight and also occasionally later in the day. At that time he was started on Invokana 300 mg daily which helped him with glucose control and mild weight loss  CURRENT insulin pump brand:  Medtronic   PUMP SETTINGS are:   Basal rates: 2.5 from midnight- 4 AM. 4 AM = 2.9.  8 AM = 2.75.  Total basal insulin 66 units Boluses : 1 unit for 4 g carbs at mealtimes  Glucose target 110-120, sensitivity 1: 20 with active insulin 2hours.  Changing infusion set every 3 days  RECENT history:   His A1c is still fairly good at 7.2, previously 6.8   CONTINUOUS GLUCOSE MONITORING RECORD INTERPRETATION    Dates of Recording: 5/23 through 08/02/2018  Sensor description: Dexcom G6, self inserted  Results statistics:   CGM use % of time  99  Average and SD  148+/-47  Time in range       74 %  % Time Above 180  24  % Time above 250  2.3  % Time Below target  1.9    Glycemic patterns summary: Blood sugars are fairly stable with more variability during the night and blood sugars are within target on an average most of the time Minimal hypoglycemia  Hyperglycemic episodes these are occurring periodically overnight although mostly last month and about 5 days Occasionally have mild hypoglycemia between 5-6 PM or 11 PM-midnight   Hypoglycemic episodes occurred to a minor extent transiently after 9 PM on Tuesday and minimally around 8 PM another day  Overnight periods:  Sugars are inconsistent and they were higher for about 5 nights in the first week but going up overnight only once in the last week or so.  Blood sugars on 5/21 were around 65-70 at 3 AM transiently otherwise no hypoglycemia  Preprandial periods: Breakfast is variable and sometimes skipped, usually around 8-10 AM His other meals are not consistent and sometimes may have 4-5 small meals Average blood sugar readings generally about 130-150 before meals with slightly higher trend in the afternoon and evening  Rarely fasting blood sugars are high from persistent hyperglycemia overnight, highest 300 from suspending pump for up to an hour  Postprandial periods:   After breakfast:   Since he does not have any consistent mealtimes difficult to analyze postprandial patterns Usually not having any significant spikes in blood sugars after meals His best POSTPRANDIAL readings are usually after breakfast with minimal rise  Occasionally may have higher blood sugars between 4-6 PM with afternoon meals  Previous data:  CGM use % of time  99  Average and SD  146+/-50, was 150  Time in range    77    %  % Time Above 180  22  % Time above 250  4  % Time Below target 2     Current management and problems:  He has stopped Invokana on his last visit because of the renal dysfunction  Lately has been more active with working around the house and he thinks that blood sugars are lower an hour or 2 after he has been active  Currently with Ozempic he has decreased appetite at mealtimes and is periodically not able to finish his meals  With bolusing before eating he may occasionally tend to have low normal or low sugars after eating  He has been liking the Dexcom sensor although rarely it may need calibration to reset it from falsely low readings otherwise it is accurate and patient using it to bolus.  As before he is not usually entering his blood sugar readings at mealtime boluses but mostly when doing  corrections  Weight has leveled off  He will use glucose tablets for low sugars, usually 2 to 3 tablets will work very well   Changing infusion set every 3-4 days     Wt Readings from Last 3 Encounters:  08/02/18 279 lb 9.6 oz (126.8 kg)  05/03/18 279 lb 3.2 oz (126.6 kg)  02/01/18 286 lb 6.4 oz (129.9 kg)    Lab Results  Component Value Date   HGBA1C 7.2 (H) 07/30/2018   HGBA1C 6.8 (H) 05/01/2018   HGBA1C 6.9 (H) 01/29/2018   Lab Results  Component Value Date   MICROALBUR 1.9 07/30/2018   LDLCALC 49 07/30/2018   CREATININE 1.25 07/30/2018    OTHER problems addressed today are in review of systems    Lab on 07/30/2018  Component Date Value Ref Range Status  . Color, Urine 07/30/2018 YELLOW  Yellow;Lt. Yellow;Straw;Dark Yellow;Amber;Green;Red;Brown Final  . APPearance 07/30/2018 CLEAR  Clear;Turbid;Slightly Cloudy;Cloudy Final  . Specific Gravity, Urine 07/30/2018 >=1.030* 1.000 - 1.030 Final  . pH 07/30/2018 5.5  5.0 - 8.0 Final  . Total Protein, Urine 07/30/2018 NEGATIVE  Negative Final  . Urine Glucose 07/30/2018 NEGATIVE  Negative Final  . Ketones, ur 07/30/2018 NEGATIVE  Negative Final  . Bilirubin Urine 07/30/2018 NEGATIVE  Negative Final  . Hgb urine dipstick 07/30/2018 NEGATIVE  Negative Final  . Urobilinogen, UA 07/30/2018 1.0  0.0 - 1.0 Final  . Leukocytes,Ua 07/30/2018 NEGATIVE  Negative Final  . Nitrite 07/30/2018 NEGATIVE  Negative Final  . WBC, UA 07/30/2018 0-2/hpf  0-2/hpf Final  . RBC / HPF 07/30/2018 none seen  0-2/hpf Final  . Squamous Epithelial / LPF 07/30/2018 Rare(0-4/hpf)  Rare(0-4/hpf) Final  . WBC 07/30/2018 4.8  4.0 - 10.5 K/uL Final  . RBC 07/30/2018 4.17* 4.22 - 5.81 Mil/uL Final  . Platelets 07/30/2018 192.0  150.0 - 400.0 K/uL Final  . Hemoglobin 07/30/2018 12.8* 13.0 - 17.0 g/dL Final  . HCT 07/30/2018 37.3* 39.0 - 52.0 % Final  . MCV 07/30/2018 89.4  78.0 - 100.0 fl Final  . MCHC 07/30/2018 34.2  30.0 - 36.0 g/dL Final  . RDW  07/30/2018 14.3  11.5 - 15.5 % Final  . Testosterone 07/30/2018 362.81  300.00 - 890.00 ng/dL Final  . TSH 07/30/2018 2.70  0.35 - 4.50 uIU/mL Final  . Cholesterol 07/30/2018 120  0 - 200 mg/dL Final   ATP III Classification       Desirable:  < 200 mg/dL               Borderline High:  200 - 239 mg/dL          High:  > = 240 mg/dL  . Triglycerides 07/30/2018 189.0* 0.0 - 149.0 mg/dL Final   Normal:  <150 mg/dLBorderline High:  150 - 199 mg/dL  . HDL 07/30/2018 33.20* >  39.00 mg/dL Final  . VLDL 07/30/2018 37.8  0.0 - 40.0 mg/dL Final  . LDL Cholesterol 07/30/2018 49  0 - 99 mg/dL Final  . Total CHOL/HDL Ratio 07/30/2018 4   Final                  Men          Women1/2 Average Risk     3.4          3.3Average Risk          5.0          4.42X Average Risk          9.6          7.13X Average Risk          15.0          11.0                      . NonHDL 07/30/2018 86.32   Final   NOTE:  Non-HDL goal should be 30 mg/dL higher than patient's LDL goal (i.e. LDL goal of < 70 mg/dL, would have non-HDL goal of < 100 mg/dL)  . Microalb, Ur 07/30/2018 1.9  0.0 - 1.9 mg/dL Final  . Creatinine,U 07/30/2018 140.7  mg/dL Final  . Microalb Creat Ratio 07/30/2018 1.4  0.0 - 30.0 mg/g Final  . Sodium 07/30/2018 141  135 - 145 mEq/L Final  . Potassium 07/30/2018 4.3  3.5 - 5.1 mEq/L Final  . Chloride 07/30/2018 107  96 - 112 mEq/L Final  . CO2 07/30/2018 28  19 - 32 mEq/L Final  . Glucose, Bld 07/30/2018 178* 70 - 99 mg/dL Final  . BUN 07/30/2018 22  6 - 23 mg/dL Final  . Creatinine, Ser 07/30/2018 1.25  0.40 - 1.50 mg/dL Final  . Total Bilirubin 07/30/2018 0.3  0.2 - 1.2 mg/dL Final  . Alkaline Phosphatase 07/30/2018 71  39 - 117 U/L Final  . AST 07/30/2018 20  0 - 37 U/L Final  . ALT 07/30/2018 31  0 - 53 U/L Final  . Total Protein 07/30/2018 5.8* 6.0 - 8.3 g/dL Final  . Albumin 07/30/2018 3.5  3.5 - 5.2 g/dL Final  . Calcium 07/30/2018 8.5  8.4 - 10.5 mg/dL Final  . GFR 07/30/2018 57.00* >60.00 mL/min  Final  . Hgb A1c MFr Bld 07/30/2018 7.2* 4.6 - 6.5 % Final   Glycemic Control Guidelines for People with Diabetes:Non Diabetic:  <6%Goal of Therapy: <7%Additional Action Suggested:  >8%     Allergies as of 08/02/2018      Reactions   Viagra [sildenafil Citrate]       Medication List       Accurate as of August 02, 2018 11:59 PM. If you have any questions, ask your nurse or doctor.        amLODipine 5 MG tablet Commonly known as:  NORVASC TAKE 1 TABLET BY MOUTH EVERY DAY   aspirin 81 MG tablet Take 81 mg by mouth daily.   atorvastatin 20 MG tablet Commonly known as:  LIPITOR TAKE 1 TABLET BY MOUTH EVERY DAY   furosemide 20 MG tablet Commonly known as:  LASIX TAKE 1 TABLET BY MOUTH EVERY DAY   glucose blood test strip Commonly known as:  Visual merchandiser Next Test Use as instructed to check blood sugars 4 times per day dx code E10.65   insulin lispro 100 UNIT/ML injection Commonly known as:  HUMALOG Use max of  170 units under the skin daily via insulin pump.   Invokana 100 MG Tabs tablet Generic drug:  canagliflozin TAKE 1 TABLET BEFORE BREAKFAST   methylphenidate 10 MG CR capsule Commonly known as:  Metadate CD Take 1 capsule (10 mg total) by mouth every morning. What changed:  additional instructions   olmesartan 5 MG tablet Commonly known as:  BENICAR TAKE 1 TABLET BY MOUTH EVERY DAY   PARoxetine 40 MG tablet Commonly known as:  PAXIL TAKE 1 TABLET BY MOUTH EVERYDAY AT BEDTIME   Semaglutide(0.25 or 0.5MG /DOS) 2 MG/1.5ML Sopn Commonly known as:  Ozempic (0.25 or 0.5 MG/DOSE) Inject 0.5 mg into the skin once a week.   testosterone 50 MG/5GM (1%) Gel Commonly known as:  ANDROGEL APPLY 1 AND 1/2 PACKETS ONTO SKIN DAILY   Vitamin D 50 MCG (2000 UT) Caps Take by mouth. Takes 6000 daily       Allergies:  Allergies  Allergen Reactions  . Viagra [Sildenafil Citrate]     Past Medical History:  Diagnosis Date  . Carpal tunnel syndrome   . Depression   .  Diabetes mellitus without complication ([place])   . ED (erectile dysfunction)   . Edema   . Hyperlipidemia   . Hypertension   . Lumbar disc disease   . Neuropathy   . Stroke [facility])     Past Surgical History:  Procedure Laterality Date  . BACK SURGERY    . VASECTOMY      Family History  Problem Relation Age of Onset  . Cancer Father        Prostate  . Diabetes Neg Hx     Social History:  reports that he has never smoked. He has never used smokeless tobacco. He reports that he does not drink alcohol or use drugs.  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   Currently on AndroGel 5 g packets, prescribed 1- 1-1/2 packets daily He has been taking this but occasionally will apply only 1 packet daily on his own He feels better when he is using 1-1/2 packets dosage  Testosterone level as follows:   Lab Results  Component Value Date   TESTOSTERONE 362.81 07/30/2018     HYPERTENSION: Controlled with  Benicar 5 mg along with  5 mg amlodipine    BP Readings from Last 3 Encounters:  08/02/18 130/70  05/03/18 (!) 142/60  02/01/18 138/60      His creatinine tends to be slightly above normal It is better now with stopping Invokana No microalbuminuria   Lab Results  Component Value Date   CREATININE 1.25 07/30/2018    CREATININE 1.56 (H) 05/01/2018   CREATININE 1.54 (H) 01/29/2018     Hypercholesterolemia: Well controlled on Lipitor 20 mg    Lab Results  Component Value Date   CHOL 120 07/30/2018  HDL 33.20 (L) 07/30/2018   LDLCALC 49 07/30/2018   LDLDIRECT 67.0 08/02/2015   TRIG 189.0 (H) 07/30/2018   CHOLHDL 4 07/30/2018    Has history of retinopathy, has regular follow-up with specialist    EXAM:   BP 130/70 (BP Location: Left Arm, Patient Position: Sitting, Cuff Size: Normal)   Pulse 84   Ht 5\' 7"  (1.702 m)   Wt 279 lb 9.6 oz (126.8 kg)   SpO2 96%   BMI 43.79 kg/m     ASSESSMENT/PLAN:  DIABETES Type I and obesity:  See history of present illness for detailed discussion on current blood sugar patterns and problems identified  His A1c is 7.2 and slightly higher than usual  He has recently average blood sugar of 146 for the last 2 weeks on his sensor However his blood sugar variability is mostly overnight and not clear why, maybe again related to occasionally eating higher fat snacks late in the evening Still benefiting from [place], does not think the increase satiety from this is excessive and wants to continue Also blood sugar may be relatively higher with stopping Invokana but has not had any weight gain from stopping this He also has been generally more active than usual and is seeing the benefits of increased physical activity for better control  Currently no consistent abnormal patterns of hyperglycemia seen Low normal or low sugars are mostly related to his not eating as much as he plans at the time of the bolus  Recommendations: Consistent diet and avoid high fat snacks at night Try to bolus only about 50% of pre-meal bolus especially in the evenings before the meal and then the rest based on how much he actually eats Consistent physical activity and can use temporary basal if needed Avoid suspending pump for more than 15 to 20 minutes Continue Ozempic Discussed  the potential for using closed-loop insulin pumps and since his insurance will only cover Medtronic he will pursue this further  HYPOGONADISM: He is not regular with the full dose of testosterone gel However discussed that his level is still below 400 and he can take 1-1/2 packets every day instead of sometimes reducing to 1 packet   HYPERTENSION: Blood pressure is normal and likely benefiting from overall better diet and increased activity  Renal dysfunction: Improved with stopping Invokana Edema: Fairly well-controlled with only low-dose Lasix  No microalbuminuria and will continue low-dose Benicar for long-term benefits  LIPIDS: LDL excellent at 49, needs weight loss to improve HDL  Vitamin D deficiency: Needs follow-up level on the next visit  Counseling time on subjects discussed in assessment and plan sections is over 50% of today's 25 minute visit   Patient Instructions  May bolus partly after the meal      Elayne Snare 08/03/2018

## 2018-08-02 NOTE — Patient Instructions (Signed)
May bolus partly after the meal

## 2018-08-24 ENCOUNTER — Other Ambulatory Visit: Payer: Self-pay | Admitting: Endocrinology

## 2018-08-28 ENCOUNTER — Other Ambulatory Visit: Payer: Self-pay | Admitting: Endocrinology

## 2018-09-17 ENCOUNTER — Ambulatory Visit (INDEPENDENT_AMBULATORY_CARE_PROVIDER_SITE_OTHER): Payer: Medicare Other | Admitting: Endocrinology

## 2018-09-17 ENCOUNTER — Other Ambulatory Visit: Payer: Self-pay

## 2018-09-17 ENCOUNTER — Encounter: Payer: Self-pay | Admitting: Endocrinology

## 2018-09-17 ENCOUNTER — Telehealth: Payer: Self-pay | Admitting: Endocrinology

## 2018-09-17 DIAGNOSIS — E109 Type 1 diabetes mellitus without complications: Secondary | ICD-10-CM | POA: Diagnosis not present

## 2018-09-17 DIAGNOSIS — H109 Unspecified conjunctivitis: Secondary | ICD-10-CM | POA: Diagnosis not present

## 2018-09-17 MED ORDER — GENTAMICIN SULFATE 0.3 % OP SOLN: 2.0000 [drp] | Freq: Four times a day (QID) | OPHTHALMIC | 0 refills | Status: DC

## 2018-09-17 NOTE — Patient Instructions (Addendum)
I have sent a prescription to your pharmacy, for eye drops. Please also take antihistamine eye drops (non-prescription). I hope you feel better soon.  If you don't feel better by next week, please call Dr Dwyane Dee back.  Please call sooner if you get worse.  Please continue the same insulin pump settings.

## 2018-09-17 NOTE — Progress Notes (Signed)
Subjective:    Patient ID: Paul Adkins, male    DOB: Jul 16, 1945, 73 y.o.   MRN: [id number]  HPI telehealth visit today via doxy video visit.  Alternatives to telehealth are presented to this patient, and the patient agrees to the telehealth visit. Pt is advised of the cost of the visit, and agrees to this, also.   Patient is at home, and I am at the office.   Persons attending the telehealth visit: the patient and I Pt states 3 days of slight bilat eye itching, and assoc swelling.  He says cbg is in the low-100's now.  Past Medical History:  Diagnosis Date  . Carpal tunnel syndrome   . Depression   . Diabetes mellitus without complication ([place])   . ED (erectile dysfunction)   . Edema   . Hyperlipidemia   . Hypertension   . Lumbar disc disease   . Neuropathy   . Stroke [facility])     Past Surgical History:  Procedure Laterality Date  . BACK SURGERY    . VASECTOMY      Social History   Socioeconomic History  . Marital status: Single    Spouse name: Not on file  . Number of children: Not on file  . Years of education: Not on file  . Highest education level: Not on file  Occupational History  . Not on file  Social Needs  . Financial resource strain: Not on file  . Food insecurity    Worry: Not on file    Inability: Not on file  . Transportation needs    Medical: Not on file    Non-medical: Not on file  Tobacco Use  . Smoking status: Never Smoker  . Smokeless tobacco: Never Used  Substance and Sexual Activity  . Alcohol use: No  . Drug use: No  . Sexual activity: Not on file  Lifestyle  . Physical activity    Days per week: Not on file    Minutes per session: Not on file  . Stress: Not on file  Relationships  . Social Herbalist on phone: Not on file    Gets together: Not on file    Attends religious service: Not on file    Active member of club or organization: Not on file    Attends meetings of clubs or organizations: Not on file   Relationship status: Not on file  . Intimate partner violence    Fear of current or ex partner: Not on file    Emotionally abused: Not on file    Physically abused: Not on file    Forced sexual activity: Not on file  Other Topics Concern  . Not on file  Social History Narrative  . Not on file    Current Outpatient Medications on File Prior to Visit  Medication Sig Dispense Refill  . amLODipine (NORVASC) 5 MG tablet TAKE 1 TABLET BY MOUTH EVERY DAY 90 tablet 1  . aspirin 81 MG tablet Take 81 mg by mouth daily.    Marland Kitchen atorvastatin (LIPITOR) 20 MG tablet TAKE 1 TABLET BY MOUTH EVERY DAY 90 tablet 1  . Cholecalciferol (VITAMIN D) 2000 units CAPS Take by mouth. Takes 6000 daily    . furosemide (LASIX) 20 MG tablet TAKE 1 TABLET BY MOUTH EVERY DAY (Patient taking differently: Take 20 mg by mouth. Take every 2 to 3 days depending on pedal edema) 30 tablet 3  . glucose blood (BAYER CONTOUR NEXT TEST) test strip  Use as instructed to check blood sugars 4 times per day dx code E10.65 150 each 5  . insulin lispro (HUMALOG) 100 UNIT/ML injection Use max of 170 units under the skin daily via insulin pump. 60 mL 3  . methylphenidate (METADATE CD) 10 MG CR capsule Take 1 capsule (10 mg total) by mouth every morning. (Patient taking differently: Take 10 mg by mouth as needed. As needed) 30 capsule 0  . olmesartan (BENICAR) 5 MG tablet TAKE 1 TABLET BY MOUTH EVERY DAY 90 tablet 3  . OZEMPIC, 0.25 OR 0.5 MG/DOSE, 2 MG/1.5ML SOPN INJECT 0.5 MG INTO THE SKIN ONCE A WEEK. 3 pen 3  . PARoxetine (PAXIL) 40 MG tablet TAKE 1 TABLET BY MOUTH EVERYDAY AT BEDTIME 90 tablet 2  . testosterone (ANDROGEL) 50 MG/5GM (1%) GEL APPLY 1 AND 1/2 PACKETS ONTO SKIN DAILY 300 g 3   No current facility-administered medications on file prior to visit.     Allergies  Allergen Reactions  . Viagra [Sildenafil Citrate]     Family History  Problem Relation Age of Onset  . Cancer Father        Prostate  . Diabetes Neg Hx      Review of Systems He also has bilat eye d/c.  Minimal if any blurry vision.  He denies hypoglycemia.      Objective:   Physical Exam VITAL SIGNS:  See vs page GENERAL: no distress Eyes: by video, I can see bilat eye redness, and conjunctival swelling.    Lab Results  Component Value Date   HGBA1C 7.2 (H) 07/30/2018       Assessment & Plan:  Conjunctivitis, new.  Allergic vs viral.  Type 1 DM: apparently well-controlled, despite current illness.   Patient Instructions  I have sent a prescription to your pharmacy, for eye drops. Please also take antihistamine eye drops (non-prescription). I hope you feel better soon.  If you don't feel better by next week, please call Dr Dwyane Dee back.  Please call sooner if you get worse.  Please continue the same insulin pump settings.

## 2018-09-17 NOTE — Telephone Encounter (Signed)
Made patient an Acute visit for Dr.Ellison due to [facility].

## 2018-09-19 DIAGNOSIS — H109 Unspecified conjunctivitis: Secondary | ICD-10-CM | POA: Insufficient documentation

## 2018-10-15 ENCOUNTER — Other Ambulatory Visit: Payer: Self-pay | Admitting: Endocrinology

## 2018-10-15 NOTE — Telephone Encounter (Signed)
Please refill if appropriate

## 2018-10-29 ENCOUNTER — Other Ambulatory Visit (INDEPENDENT_AMBULATORY_CARE_PROVIDER_SITE_OTHER): Payer: Medicare Other

## 2018-10-29 ENCOUNTER — Other Ambulatory Visit: Payer: Self-pay

## 2018-10-29 DIAGNOSIS — E559 Vitamin D deficiency, unspecified: Secondary | ICD-10-CM | POA: Diagnosis not present

## 2018-10-29 DIAGNOSIS — E1065 Type 1 diabetes mellitus with hyperglycemia: Secondary | ICD-10-CM | POA: Diagnosis not present

## 2018-10-29 LAB — COMPREHENSIVE METABOLIC PANEL
ALT: 46 U/L (ref 0–53)
AST: 27 U/L (ref 0–37)
Albumin: 3.7 g/dL (ref 3.5–5.2)
Alkaline Phosphatase: 74 U/L (ref 39–117)
BUN: 24 mg/dL — ABNORMAL HIGH (ref 6–23)
CO2: 25 mEq/L (ref 19–32)
Calcium: 8.8 mg/dL (ref 8.4–10.5)
Chloride: 108 mEq/L (ref 96–112)
Creatinine, Ser: 1.25 mg/dL (ref 0.40–1.50)
GFR: 56.96 mL/min — ABNORMAL LOW (ref >60.00)
Glucose, Bld: 93 mg/dL (ref 70–99)
Potassium: 3.9 mEq/L (ref 3.5–5.1)
Sodium: 141 mEq/L (ref 135–145)
Total Bilirubin: 0.2 mg/dL (ref 0.2–1.2)
Total Protein: 6.2 g/dL (ref 6.0–8.3)

## 2018-10-29 LAB — VITAMIN D 25 HYDROXY (VIT D DEFICIENCY, FRACTURES): VITD: 55.82 ng/mL (ref 30.00–100.00)

## 2018-10-29 LAB — HEMOGLOBIN A1C: Hgb A1c MFr Bld: 7.4 % — ABNORMAL HIGH (ref 4.6–6.5)

## 2018-11-01 ENCOUNTER — Ambulatory Visit: Payer: Medicare Other | Admitting: Endocrinology

## 2018-11-01 ENCOUNTER — Encounter: Payer: Self-pay | Admitting: Endocrinology

## 2018-11-01 ENCOUNTER — Other Ambulatory Visit: Payer: Self-pay

## 2018-11-01 VITALS — BP 138/60 | HR 68 | Ht 67.0 in | Wt 282.0 lb

## 2018-11-01 DIAGNOSIS — I10 Essential (primary) hypertension: Secondary | ICD-10-CM | POA: Diagnosis not present

## 2018-11-01 DIAGNOSIS — E1065 Type 1 diabetes mellitus with hyperglycemia: Secondary | ICD-10-CM | POA: Diagnosis not present

## 2018-11-01 DIAGNOSIS — E291 Testicular hypofunction: Secondary | ICD-10-CM

## 2018-11-01 DIAGNOSIS — R0789 Other chest pain: Secondary | ICD-10-CM

## 2018-11-01 NOTE — Progress Notes (Signed)
Patient ID: Paul Adkins, male   DOB: February 03, 1946, 73 y.o.   MRN: [id number]    Reason for visit:   follow-up  Diagnosis: Type 1 diabetes, date of onset 1978  PAST history: He has had persistently poorly controlled diabetes for several years. A1c has been high with usual range 8.2 -9, overall improved since 2013. Compliance with glucose monitoring and diet has been variable and he does better when he is checking more sugars. His A1c was better than usual in 3/14 at 7.5 but he was also having significant hypoglycemia overnight and also occasionally later in the day. At that time he was started on Invokana 300 mg daily which helped him with glucose control and mild weight loss  CURRENT insulin pump brand:  Medtronic   PUMP SETTINGS are:   Basal rates: 2.5 from midnight- 4 AM. 4 AM = 2.9.  8 AM = 2.75.  Total basal insulin 66 units Boluses : 1 unit for 4 g carbs at mealtimes  Glucose target 110-120, sensitivity 1: 20 with active insulin 2hours.  Changing infusion set every 3 days  RECENT history:   His A1c is gradually rising, now 7.4 and has been as low as 6.8   CONTINUOUS GLUCOSE MONITORING RECORD INTERPRETATION    Dates of Recording: 8/22/9/4  Sensor description: Dexcom  Results statistics:   CGM use % of time  99  Average and SD  151 +/-49  Time in range     78   %  % Time Above 180  22  % Time above 250  4  % Time Below target  0.5    Glycemic patterns summary: Blood sugars are fairly stable through the day but periodically much higher overnight and occasionally after dinner depending on his intake and adequacy of mealtime coverage, may have higher fat intake causing high readings also   Hyperglycemic episodes are occurring variably after meals especially late evening and sometimes during the night depending on his intake, rarely may have higher blood sugar spikes after breakfast  Hypoglycemic episodes have not occurred  Overnight periods: Blood sugars show  moderate variability but on an average blood sugars are stable around 160 with a gradual rise around 6 AM However lowest blood sugars are in the 80s and 90s early morning also Some variability may be likely from his eating during the night also  Preprandial periods: His mealtimes are variable and difficult to assess since, however blood sugars are excellent around 130 average around 5-8 PM  Postprandial periods:   After breakfast: Blood sugars are relatively even before and after meals After lunch:   Glucose is excellent with no significant rise although intake in the afternoon may be spread out over 2 meals  After dinner: Blood sugars rise variably after evening meals and occasionally over 200 postprandially Usually below 180    Current management and problems:  He has started gaining back weight, initially had benefited from [place]  He is eating variably  His blood sugars are more out-of-control about 3 weeks ago when his blood sugars in the target range were only 55% compared to 77% now because of high blood sugars especially after evening with snacks  He is just doing a lot of housework but no formal exercise  No hypoglycemia   Changing infusion set every 3-4 days     Wt Readings from Last 3 Encounters:  11/01/18 282 lb (127.9 kg)  08/02/18 279 lb 9.6 oz (126.8 kg)  05/03/18 279 lb 3.2  oz (126.6 kg)    Lab Results  Component Value Date   HGBA1C 7.4 (H) 10/29/2018   HGBA1C 7.2 (H) 07/30/2018   HGBA1C 6.8 (H) 05/01/2018   Lab Results  Component Value Date   MICROALBUR 1.9 07/30/2018   LDLCALC 49 07/30/2018   CREATININE 1.25 10/29/2018    OTHER problems addressed today are in review of systems    Lab on 10/29/2018  Component Date Value Ref Range Status  . VITD 10/29/2018 55.82  30.00 - 100.00 ng/mL Final  . Sodium 10/29/2018 141  135 - 145 mEq/L Final  . Potassium 10/29/2018 3.9  3.5 - 5.1 mEq/L Final  . Chloride 10/29/2018 108  96 - 112 mEq/L Final  .  CO2 10/29/2018 25  19 - 32 mEq/L Final  . Glucose, Bld 10/29/2018 93  70 - 99 mg/dL Final  . BUN 10/29/2018 24* 6 - 23 mg/dL Final  . Creatinine, Ser 10/29/2018 1.25  0.40 - 1.50 mg/dL Final  . Total Bilirubin 10/29/2018 0.2  0.2 - 1.2 mg/dL Final  . Alkaline Phosphatase 10/29/2018 74  39 - 117 U/L Final  . AST 10/29/2018 27  0 - 37 U/L Final  . ALT 10/29/2018 46  0 - 53 U/L Final  . Total Protein 10/29/2018 6.2  6.0 - 8.3 g/dL Final  . Albumin 10/29/2018 3.7  3.5 - 5.2 g/dL Final  . Calcium 10/29/2018 8.8  8.4 - 10.5 mg/dL Final  . GFR 10/29/2018 56.96* >60.00 mL/min Final  . Hgb A1c MFr Bld 10/29/2018 7.4* 4.6 - 6.5 % Final   Glycemic Control Guidelines for People with Diabetes:Non Diabetic:  <6%Goal of Therapy: <7%Additional Action Suggested:  >8%     Allergies as of 11/01/2018      Reactions   Viagra [sildenafil Citrate]       Medication List       Accurate as of November 01, 2018  9:19 AM. If you have any questions, ask your nurse or doctor.        amLODipine 5 MG tablet Commonly known as: NORVASC TAKE 1 TABLET BY MOUTH EVERY DAY   aspirin 81 MG tablet Take 81 mg by mouth daily.   atorvastatin 20 MG tablet Commonly known as: LIPITOR TAKE 1 TABLET BY MOUTH EVERY DAY   furosemide 20 MG tablet Commonly known as: LASIX TAKE 1 TABLET BY MOUTH EVERY DAY What changed:   when to take this  additional instructions   gentamicin 0.3 % ophthalmic solution Commonly known as: GARAMYCIN Place 2 drops into both eyes 4 (four) times daily.   glucose blood test strip Commonly known as: Visual merchandiser Next Test Use as instructed to check blood sugars 4 times per day dx code E10.65   insulin lispro 100 UNIT/ML injection Commonly known as: HUMALOG Use max of 170 units under the skin daily via insulin pump.   methylphenidate 10 MG CR capsule Commonly known as: Metadate CD Take 1 capsule (10 mg total) by mouth every morning. What changed:   when to take this  reasons to  take this  additional instructions   olmesartan 5 MG tablet Commonly known as: BENICAR TAKE 1 TABLET BY MOUTH EVERY DAY   Ozempic (0.25 or 0.5 MG/DOSE) 2 MG/1.5ML Sopn Generic drug: Semaglutide(0.25 or 0.5MG /DOS) INJECT 0.5 MG INTO THE SKIN ONCE A WEEK.   PARoxetine 40 MG tablet Commonly known as: PAXIL TAKE 1 TABLET BY MOUTH EVERYDAY AT BEDTIME   testosterone 50 MG/5GM (1%) Gel Commonly known as: ANDROGEL APPLY  1 AND 1/2 PACKETS ONTO SKIN DAILY   Vitamin D 50 MCG (2000 UT) Caps Take by mouth. Takes 6000 daily       Allergies:  Allergies  Allergen Reactions  . Viagra [Sildenafil Citrate]     Past Medical History:  Diagnosis Date  . Carpal tunnel syndrome   . Depression   . Diabetes mellitus without complication ([place])   . ED (erectile dysfunction)   . Edema   . Hyperlipidemia   . Hypertension   . Lumbar disc disease   . Neuropathy   . Stroke [facility])     Past Surgical History:  Procedure Laterality Date  . BACK SURGERY    . VASECTOMY      Family History  Problem Relation Age of Onset  . Cancer Father        Prostate  . Diabetes Neg Hx     Social History:  reports that he has never smoked. He has never used smokeless tobacco. He reports that he does not drink alcohol or use drugs.  REVIEW of systems:  He fell down from his bed a few weeks ago at night and hurt the side of his right chest during a dream Still has pain on movement, lying flat, driving and certain positions Previously was taking ibuprofen and now only as needed  He has been on vitamin D supplementation for the last few years and on his own has been taking 6000 units daily Levels have been consistent  Lab Results  Component Value Date   VD25OH 55.82 10/29/2018   VD25OH 50.27 10/30/2017   VD25OH 57.15 03/16/2017    Anxiety depression and ADD: He does see the psychologist He has taken Ritalin mostly as needed with benefit  EDEMA: He has had good control with taking Lasix 20 mg,  usually prn, He takes this less frequently now because of excessive diuresis   HYPOGONADISM:  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   Currently on AndroGel 5 g packets, prescribed 1- 1-1/2 packets daily He has been taking this but half the time will apply only 1 packet and not clear why He feels better when he is using 1-1/2 packets dosage  Testosterone level as follows:   Lab Results  Component Value Date   TESTOSTERONE 362.81 07/30/2018     HYPERTENSION: Controlled with  Benicar 5 mg along with  5 mg amlodipine Blood pressure at home tends to be higher than in the office around 145  BP Readings from Last 3 Encounters:  11/01/18 138/60  08/02/18 130/70  05/03/18 (!) 142/60      His creatinine tends to be slightly above normal but better now May have been higher with Invokana No microalbuminuria   Lab Results  Component Value Date   CREATININE 1.25 10/29/2018   CREATININE 1.25 07/30/2018   CREATININE 1.56 (H) 05/01/2018     Hypercholesterolemia: Well controlled on Lipitor 20 mg    Lab Results  Component Value Date   CHOL 120 07/30/2018   HDL 33.20 (L) 07/30/2018   LDLCALC 49 07/30/2018   LDLDIRECT 67.0 08/02/2015   TRIG 189.0 (H) 07/30/2018   CHOLHDL 4 07/30/2018    Has history of retinopathy, has regular follow-up with specialist    EXAM:   BP 138/60 (Cuff Size: Large)   Pulse 68   Ht 5\' 7"  (1.702 m)   Wt 282 lb (127.9 kg)   SpO2 99%   BMI  44.17 kg/m   Tenderness on the right chest area indicated over the posterior lateral rib cage, no point tenderness No pedal edema   ASSESSMENT/PLAN:  DIABETES Type I and obesity:  See history of present illness for detailed discussion on current blood sugar patterns and problems identified  His A1c is 7.4  Again has difficulty watching his diet causing poor glycemia especially late evening and  overnight when he is eating more snacks His blood sugars were more out-of-control about 2 weeks ago as seen on his comparison graft from the CGM Details of his CGM were reviewed and patient to follow-up results He has not lost any weight and still significantly obese  Recommendations: Need to watch his diet better, he will need to cut back on snacks, excessive carbohydrates and higher fat meals and snacks Trial of 1 mg Ozempic We will try to do better with entering his blood sugars in the pump and allowing the pump to do the correction boluses when sugar is high More regular exercise  HYPOGONADISM: He is not regular with 1-1/2 packets as prescribed Since he subjectively also feels better with the higher dose he will do this consistently Recheck labs on the next visit   HYPERTENSION: Blood pressure is normal and will continue same medication Advised him to avoid excessive ibuprofen He can take Lasix as needed again  Renal dysfunction: Improved with stopping Invokana Probable right rib fracture: He can use abdominal binder  LIPIDS: We will follow-up on next visit  Influenza vaccine in October  Vitamin D deficiency: Continue 6000 units daily  Counseling time on subjects discussed in assessment and plan sections is over 50% of today's 25 minute visit   Patient Instructions  ozempic 1mg  weekly  Abdominal binder      Elayne Snare 11/01/2018

## 2018-11-01 NOTE — Patient Instructions (Signed)
ozempic 1mg  weekly  Abdominal binder

## 2018-11-21 ENCOUNTER — Other Ambulatory Visit: Payer: Self-pay

## 2018-11-21 MED ORDER — OZEMPIC (1 MG/DOSE) 2 MG/1.5ML ~~LOC~~ SOPN: 1.0000 mg | PEN_INJECTOR | SUBCUTANEOUS | 2 refills | Status: DC

## 2018-12-08 ENCOUNTER — Other Ambulatory Visit: Payer: Self-pay | Admitting: Endocrinology

## 2018-12-17 ENCOUNTER — Ambulatory Visit (INDEPENDENT_AMBULATORY_CARE_PROVIDER_SITE_OTHER): Payer: Medicare Other

## 2018-12-17 ENCOUNTER — Other Ambulatory Visit: Payer: Self-pay

## 2018-12-17 ENCOUNTER — Encounter: Payer: Self-pay | Admitting: Endocrinology

## 2018-12-17 DIAGNOSIS — Z23 Encounter for immunization: Secondary | ICD-10-CM

## 2018-12-19 ENCOUNTER — Other Ambulatory Visit: Payer: Self-pay | Admitting: Endocrinology

## 2019-01-13 ENCOUNTER — Encounter (INDEPENDENT_AMBULATORY_CARE_PROVIDER_SITE_OTHER): Payer: Medicare Other | Admitting: Ophthalmology

## 2019-01-13 DIAGNOSIS — H35033 Hypertensive retinopathy, bilateral: Secondary | ICD-10-CM

## 2019-01-13 DIAGNOSIS — H26491 Other secondary cataract, right eye: Secondary | ICD-10-CM

## 2019-01-13 DIAGNOSIS — E113391 Type 2 diabetes mellitus with moderate nonproliferative diabetic retinopathy without macular edema, right eye: Secondary | ICD-10-CM

## 2019-01-13 DIAGNOSIS — I10 Essential (primary) hypertension: Secondary | ICD-10-CM

## 2019-01-13 DIAGNOSIS — E11319 Type 2 diabetes mellitus with unspecified diabetic retinopathy without macular edema: Secondary | ICD-10-CM | POA: Diagnosis not present

## 2019-01-13 DIAGNOSIS — H43813 Vitreous degeneration, bilateral: Secondary | ICD-10-CM

## 2019-01-13 DIAGNOSIS — E113592 Type 2 diabetes mellitus with proliferative diabetic retinopathy without macular edema, left eye: Secondary | ICD-10-CM | POA: Diagnosis not present

## 2019-01-13 LAB — HM DIABETES EYE EXAM

## 2019-01-16 ENCOUNTER — Other Ambulatory Visit: Payer: Self-pay | Admitting: Endocrinology

## 2019-01-28 ENCOUNTER — Other Ambulatory Visit (INDEPENDENT_AMBULATORY_CARE_PROVIDER_SITE_OTHER): Payer: Medicare Other

## 2019-01-28 ENCOUNTER — Other Ambulatory Visit: Payer: Self-pay

## 2019-01-28 DIAGNOSIS — E1065 Type 1 diabetes mellitus with hyperglycemia: Secondary | ICD-10-CM

## 2019-01-28 DIAGNOSIS — E291 Testicular hypofunction: Secondary | ICD-10-CM

## 2019-01-28 LAB — COMPREHENSIVE METABOLIC PANEL
ALT: 38 U/L (ref 0–53)
AST: 19 U/L (ref 0–37)
Albumin: 3.6 g/dL (ref 3.5–5.2)
Alkaline Phosphatase: 76 U/L (ref 39–117)
BUN: 27 mg/dL — ABNORMAL HIGH (ref 6–23)
CO2: 25 mEq/L (ref 19–32)
Calcium: 8.6 mg/dL (ref 8.4–10.5)
Chloride: 108 mEq/L (ref 96–112)
Creatinine, Ser: 1.26 mg/dL (ref 0.40–1.50)
GFR: 56.39 mL/min — ABNORMAL LOW (ref >60.00)
Glucose, Bld: 157 mg/dL — ABNORMAL HIGH (ref 70–99)
Potassium: 3.7 mEq/L (ref 3.5–5.1)
Sodium: 139 mEq/L (ref 135–145)
Total Bilirubin: 0.3 mg/dL (ref 0.2–1.2)
Total Protein: 5.8 g/dL — ABNORMAL LOW (ref 6.0–8.3)

## 2019-01-28 LAB — CBC
HCT: 39.3 % (ref 39.0–52.0)
Hemoglobin: 13 g/dL (ref 13.0–17.0)
MCHC: 33.1 g/dL (ref 30.0–36.0)
MCV: 90 fl (ref 78.0–100.0)
Platelets: 212 10*3/uL (ref 150.0–400.0)
RBC: 4.37 Mil/uL (ref 4.22–5.81)
RDW: 14.2 % (ref 11.5–15.5)
WBC: 6.2 10*3/uL (ref 4.0–10.5)

## 2019-01-28 LAB — HEMOGLOBIN A1C: Hgb A1c MFr Bld: 6.9 % — ABNORMAL HIGH (ref 4.6–6.5)

## 2019-01-28 LAB — TESTOSTERONE: Testosterone: 266.25 ng/dL — ABNORMAL LOW (ref 300.00–890.00)

## 2019-01-28 LAB — TSH: TSH: 3.29 u[IU]/mL (ref 0.35–4.50)

## 2019-01-30 NOTE — Progress Notes (Signed)
Patient ID: Paul Adkins, male   DOB: May 09, 1945, 73 y.o.   MRN: [id number]    Reason for visit:   follow-up  Diagnosis: Type 1 diabetes, date of onset 1978  PAST history: He has had persistently poorly controlled diabetes for several years. A1c has been high with usual range 8.2 -9, overall improved since 2013. Compliance with glucose monitoring and diet has been variable and he does better when he is checking more sugars. His A1c was better than usual in 3/14 at 7.5 but he was also having significant hypoglycemia overnight and also occasionally later in the day. At that time he was started on Invokana 300 mg daily which helped him with glucose control and mild weight loss  CURRENT insulin pump brand:  Medtronic   PUMP SETTINGS are:   Basal rates: 2.5 from midnight- 4 AM. 4 AM = 2.9.  8 AM = 2.75.  Total basal insulin 66 units Boluses : 1 unit for 4 g carbs at mealtimes  Glucose target 110-120, sensitivity 1: 20 with active insulin 2hours.  Changing infusion set every 3 days  RECENT history:   His A1c is improved at 6.9 compared to 7.4 and has been as low as 6.8  CONTINUOUS GLUCOSE MONITORING RECORD INTERPRETATION    Dates of Recording: 11/21 through 12/4  Sensor description: Dexcom G6  Results statistics:   CGM use % of time  99  Average and SD  163, CV 32  Time in range       66%  % Time Above 180  33  % Time above 250  6  % Time Below target  0.9    PRE-MEAL Fasting Lunch Dinner Bedtime Overall  Glucose range:       Mean/median:  151  126  156     POST-MEAL PC Breakfast PC Lunch PC Dinner  Glucose range:     Mean/median:  139  143  224    Glycemic patterns summary: Blood sugar data is quite completed over the last 2 weeks HIGHEST blood sugars are about 10 PM and lowest around 12-1 PM Onset hyperglycemia is around 5-6 PM with moderate variability No significant hypoglycemia  Hyperglycemic episodes are related to inadequate coverage of meals and  snacks with the evenings generally starting after about 7 PM until 11 PM at least   Hypoglycemic episodes have been minimal, only once late at night related to overcorrection and transiently low before dinner another time  Overnight periods: Blood sugars are starting of averaging about 190 and declining only after about 4 AM gradually but not at target on an average in the morning  Preprandial periods: Blood sugars are mildly increased at breakfast time At lunch and dinnertime average blood sugars are 126 and 156 respectively  Postprandial periods:   After breakfast: Blood sugars are generally flat to lower after the meal if higher to start with    After lunch: Blood sugars are very well controlled without any failure high excursions in the late afternoon  After dinner: Blood sugars are significantly higher in the evenings except on a couple of occasions and occasionally blood sugar is going around 350 at peak levels    Current management and problems:  He has increased Ozempic on the last visit and weight is slightly better  However in the last 2 weeks or so is starting to overeat and get more carbohydrates in the evenings causing hyperglycemia despite taking boluses for most of the extra food intake such as  oatmeal  Blood sugars are fairly well controlled otherwise during the day  Fasting blood sugars do come down but with variable response  Forgets to enter his blood sugar at the time of bolus and correction bolus doses are approximate and not clear how often he is doing these  He is still not motivated to do any formal exercise even though he has an exercise bike available  No hypoglycemia   Changing infusion set every 3-4 days     Wt Readings from Last 3 Encounters:  01/31/19 279 lb (126.6 kg)  11/01/18 282 lb (127.9 kg)  08/02/18 279 lb 9.6 oz (126.8 kg)    Lab Results  Component Value Date   HGBA1C 6.9 (H) 01/28/2019   HGBA1C 7.4 (H) 10/29/2018   HGBA1C 7.2 (H)  07/30/2018   Lab Results  Component Value Date   MICROALBUR 1.9 07/30/2018   LDLCALC 49 07/30/2018   CREATININE 1.26 01/28/2019    OTHER problems addressed today are in review of systems    Lab on 01/28/2019  Component Date Value Ref Range Status  . WBC 01/28/2019 6.2  4.0 - 10.5 K/uL Final  . RBC 01/28/2019 4.37  4.22 - 5.81 Mil/uL Final  . Platelets 01/28/2019 212.0  150.0 - 400.0 K/uL Final  . Hemoglobin 01/28/2019 13.0  13.0 - 17.0 g/dL Final  . HCT 01/28/2019 39.3  39.0 - 52.0 % Final  . MCV 01/28/2019 90.0  78.0 - 100.0 fl Final  . MCHC 01/28/2019 33.1  30.0 - 36.0 g/dL Final  . RDW 01/28/2019 14.2  11.5 - 15.5 % Final  . Testosterone 01/28/2019 266.25* 300.00 - 890.00 ng/dL Final  . TSH 01/28/2019 3.29  0.35 - 4.50 uIU/mL Final  . Sodium 01/28/2019 139  135 - 145 mEq/L Final  . Potassium 01/28/2019 3.7  3.5 - 5.1 mEq/L Final  . Chloride 01/28/2019 108  96 - 112 mEq/L Final  . CO2 01/28/2019 25  19 - 32 mEq/L Final  . Glucose, Bld 01/28/2019 157* 70 - 99 mg/dL Final  . BUN 01/28/2019 27* 6 - 23 mg/dL Final  . Creatinine, Ser 01/28/2019 1.26  0.40 - 1.50 mg/dL Final  . Total Bilirubin 01/28/2019 0.3  0.2 - 1.2 mg/dL Final  . Alkaline Phosphatase 01/28/2019 76  39 - 117 U/L Final  . AST 01/28/2019 19  0 - 37 U/L Final  . ALT 01/28/2019 38  0 - 53 U/L Final  . Total Protein 01/28/2019 5.8* 6.0 - 8.3 g/dL Final  . Albumin 01/28/2019 3.6  3.5 - 5.2 g/dL Final  . GFR 01/28/2019 56.39* >60.00 mL/min Final  . Calcium 01/28/2019 8.6  8.4 - 10.5 mg/dL Final  . Hgb A1c MFr Bld 01/28/2019 6.9* 4.6 - 6.5 % Final   Glycemic Control Guidelines for People with Diabetes:Non Diabetic:  <6%Goal of Therapy: <7%Additional Action Suggested:  >8%     Allergies as of 01/31/2019      Reactions   Viagra [sildenafil Citrate]       Medication List       Accurate as of January 31, 2019 11:59 PM. If you have any questions, ask your nurse or doctor.        STOP taking these medications    gentamicin 0.3 % ophthalmic solution Commonly known as: GARAMYCIN Stopped by: Elayne Snare, MD     TAKE these medications   amLODipine 5 MG tablet Commonly known as: NORVASC TAKE 1 TABLET BY MOUTH EVERY DAY   aspirin 81 MG tablet  Take 81 mg by mouth daily.   atorvastatin 20 MG tablet Commonly known as: LIPITOR TAKE 1 TABLET BY MOUTH EVERY DAY   furosemide 20 MG tablet Commonly known as: LASIX TAKE 1 TABLET BY MOUTH EVERY DAY What changed:   when to take this  additional instructions   glucose blood test strip Commonly known as: Visual merchandiser Next Test Use as instructed to check blood sugars 4 times per day dx code E10.65   insulin lispro 100 UNIT/ML injection Commonly known as: HumaLOG USE MAX OF 170 UNITS UNDER THE SKIN DAILY VIA INSULIN PUMP.   methylphenidate 10 MG CR capsule Commonly known as: Metadate CD Take 1 capsule (10 mg total) by mouth every morning. What changed:   when to take this  reasons to take this  additional instructions   olmesartan 5 MG tablet Commonly known as: BENICAR TAKE 1 TABLET BY MOUTH EVERY DAY   Ozempic (1 MG/DOSE) 2 MG/1.5ML Sopn Generic drug: Semaglutide (1 MG/DOSE) Inject 1 mg into the skin once a week. Inject 1mg  under the skin once weekly.   PARoxetine 40 MG tablet Commonly known as: PAXIL TAKE 1 TABLET BY MOUTH EVERYDAY AT BEDTIME   testosterone 50 MG/5GM (1%) Gel Commonly known as: ANDROGEL APPLY 1 AND 1/2 PACKETS ONTO SKIN DAILY   Vitamin D 50 MCG (2000 UT) Caps Take by mouth. Takes 6000 daily       Allergies:  Allergies  Allergen Reactions  . Viagra [Sildenafil Citrate]     Past Medical History:  Diagnosis Date  . Carpal tunnel syndrome   . Depression   . Diabetes mellitus without complication ([place])   . ED (erectile dysfunction)   . Edema   . Hyperlipidemia   . Hypertension   . Lumbar disc disease   . Neuropathy   . Stroke [facility])     Past Surgical History:  Procedure Laterality Date  . BACK  SURGERY    . VASECTOMY      Family History  Problem Relation Age of Onset  . Cancer Father        Prostate  . Diabetes Neg Hx     Social History:  reports that he has never smoked. He has never used smokeless tobacco. He reports that he does not drink alcohol or use drugs.  REVIEW of systems:  Right rib cage pain: Although this had resolved after his last visit he has had recurrence for about 3 days until recently and then may still have pain when he is moving a certain way  He has been on vitamin D supplementation for the last few years and on his own has been taking 6000 units daily Levels have been normal over 50  Lab Results  Component Value Date   VD25OH 55.82 10/29/2018   VD25OH 50.27 10/30/2017   VD25OH 57.15 03/16/2017    Anxiety depression and ADD: Also followed by psychologist He has taken Ritalin mostly as needed with benefit  EDEMA: Only occasionally needs Lasix, does not like to take it regularly because of diuresis   HYPOGONADISM:  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   Currently on AndroGel 5 g packets, prescribed 1- 1-1/2 packets daily He has been again not using the full dose every day Testosterone level is lower Does have fatigue and low motivation at times   Testosterone level as follows:   Lab Results  Component Value Date   TESTOSTERONE 266.25 (L)  01/28/2019     HYPERTENSION: Controlled with  Benicar 5 mg along with  5 mg amlodipine Blood pressure at home tends to be higher than in the office around 145  BP Readings from Last 3 Encounters:  01/31/19 134/84  11/01/18 138/60  08/02/18 130/70      His creatinine is upper normal consistently   May have been higher with Invokana No microalbuminuria   Lab Results  Component Value Date   CREATININE 1.26 01/28/2019   CREATININE 1.25 10/29/2018   CREATININE 1.25 07/30/2018      Hypercholesterolemia: Well controlled on Lipitor 20 mg    Lab Results  Component Value Date   CHOL 120 07/30/2018   HDL 33.20 (L) 07/30/2018   LDLCALC 49 07/30/2018   LDLDIRECT 67.0 08/02/2015   TRIG 189.0 (H) 07/30/2018   CHOLHDL 4 07/30/2018    Has history of retinopathy, has regular follow-up with specialist    EXAM:   BP 134/84 (BP Location: Left Arm, Patient Position: Sitting, Cuff Size: Large)   Pulse 70   Ht 5\' 7"  (1.702 m)   Wt 279 lb (126.6 kg)   SpO2 98%   BMI 43.70 kg/m      ASSESSMENT/PLAN:  DIABETES Type I and obesity:  See history of present illness for detailed discussion on current blood sugar patterns and problems identified  His A1c is 6.9 was 7.4  Although his overall control is better with increasing Ozempic and mostly watching his diet better blood sugars are higher in the last 2 weeks at least These are mostly related to extra food and carbohydrate intake in the evenings causing blood sugars to rise after about 4-5 PM until late at night This is occurring fairly consistently except for couple of days Analysis of CGM is as above and patterns were discussed with patient Weight is only slightly better but he can do some exercise which he still is not motivated to do  Recommendations: Need to cut back on snacks and extra carbohydrate in the evenings Increase basal rate at 7 PM-11 PM up to 3.0 Enter blood sugars at the time of each bolus especially in the evening to allow more accurate correction and estimation of insulin on board Avoid overcorrection of high readings with stacking boluses Continue 1 mg Ozempic which is likely benefiting We will try to do better with entering his blood sugars in the pump and allowing the pump to do the correction boluses when sugar is high Start regular exercise on his exercise bike that he has indoors  HYPOGONADISM: He is still not regular with 1-1/2 packets every day as prescribed and will sometimes taking 1  packet With this he has inconsistent improvement in his overall energy level and motivation Testosterone level is lower than before and below normal now  Recheck labs on the next visit   HYPERTENSION: Blood pressure is fairly well controlled although higher diastolic today in the office We will continue same regimen for now Renal dysfunction: Improved with stopping Invokana  History of likely rib fracture with mild trauma: He is still having some persistent pain and may have some nonhealing Considering his home long history of diabetes and hypogonadism need to evaluate baseline bone density   Shingrix vaccine #1 given  LIPIDS: Well-controlled as of 6/20  Follow-up in 3 months  Total visit time for evaluation and management of multiple problems and counseling =25 minutes   Patient Instructions  Androgel 1-1/2 packets dosage  Exercise in pms  Elayne Snare 02/01/2019

## 2019-01-31 ENCOUNTER — Encounter: Payer: Self-pay | Admitting: Endocrinology

## 2019-01-31 ENCOUNTER — Other Ambulatory Visit: Payer: Self-pay

## 2019-01-31 ENCOUNTER — Ambulatory Visit: Payer: Medicare Other | Admitting: Endocrinology

## 2019-01-31 VITALS — BP 134/84 | HR 70 | Ht 67.0 in | Wt 279.0 lb

## 2019-01-31 DIAGNOSIS — S2231XG Fracture of one rib, right side, subsequent encounter for fracture with delayed healing: Secondary | ICD-10-CM | POA: Diagnosis not present

## 2019-01-31 DIAGNOSIS — E291 Testicular hypofunction: Secondary | ICD-10-CM | POA: Diagnosis not present

## 2019-01-31 DIAGNOSIS — E1065 Type 1 diabetes mellitus with hyperglycemia: Secondary | ICD-10-CM

## 2019-01-31 DIAGNOSIS — E559 Vitamin D deficiency, unspecified: Secondary | ICD-10-CM

## 2019-01-31 DIAGNOSIS — Z23 Encounter for immunization: Secondary | ICD-10-CM | POA: Diagnosis not present

## 2019-01-31 DIAGNOSIS — I10 Essential (primary) hypertension: Secondary | ICD-10-CM

## 2019-01-31 NOTE — Patient Instructions (Addendum)
Androgel 1-1/2 packets dosage  Exercise in pms

## 2019-02-03 ENCOUNTER — Encounter (INDEPENDENT_AMBULATORY_CARE_PROVIDER_SITE_OTHER): Payer: Medicare Other | Admitting: Ophthalmology

## 2019-02-03 DIAGNOSIS — H2701 Aphakia, right eye: Secondary | ICD-10-CM

## 2019-02-10 ENCOUNTER — Other Ambulatory Visit: Payer: Self-pay

## 2019-02-10 ENCOUNTER — Ambulatory Visit (INDEPENDENT_AMBULATORY_CARE_PROVIDER_SITE_OTHER)
Admission: RE | Admit: 2019-02-10 | Discharge: 2019-02-10 | Disposition: A | Discharge status: Home / Self Care | Payer: Medicare Other | Source: Ambulatory Visit | Attending: Endocrinology | Admitting: Endocrinology

## 2019-02-10 DIAGNOSIS — Z13820 Encounter for screening for osteoporosis: Secondary | ICD-10-CM

## 2019-02-10 DIAGNOSIS — S2231XG Fracture of one rib, right side, subsequent encounter for fracture with delayed healing: Secondary | ICD-10-CM

## 2019-02-10 DIAGNOSIS — E1065 Type 1 diabetes mellitus with hyperglycemia: Secondary | ICD-10-CM

## 2019-02-10 DIAGNOSIS — E291 Testicular hypofunction: Secondary | ICD-10-CM

## 2019-02-11 ENCOUNTER — Other Ambulatory Visit: Payer: Self-pay | Admitting: Endocrinology

## 2019-02-12 NOTE — Progress Notes (Signed)
Please call to let patient know that the bone density results are normal and no further action needed

## 2019-02-15 ENCOUNTER — Other Ambulatory Visit: Payer: Self-pay | Admitting: Endocrinology

## 2019-02-17 NOTE — Telephone Encounter (Signed)
Please refill if appropriate

## 2019-04-01 ENCOUNTER — Telehealth: Payer: Self-pay | Admitting: Endocrinology

## 2019-04-01 NOTE — Telephone Encounter (Signed)
[employer] called saying they faxed over a CMN form and a request for office notes on 1/27, told her we did not get it so I told her to please re send.

## 2019-04-01 NOTE — Telephone Encounter (Signed)
Noted  

## 2019-04-03 ENCOUNTER — Telehealth: Payer: Self-pay

## 2019-04-03 NOTE — Telephone Encounter (Signed)
PA initiated via [email or web address] for Testosterone 50mg /5Gm (1% gel)  Mana Hebb Key: [id number] - PA Case ID: [id number] - Rx #[id number] Need help? Call us at [phone] Status Sent to [employer] [facility] Testosterone 50 MG/5GM(1%) gel Form [facility] Electronic PA Form Original Claim Info [phone] PA REQD CALL [phone] REQUIRED  Januel Hileman (Key: [id number])  Your information has been submitted to [facility]. [employer] will review the request and will issue a decision, typically within 3-7 days from your submission. You can check the updated outcome later by reopening this request.  If [employer] has not responded in 3-7 days or if you have any questions about your ePA request, please contact [employer] at [phone]. If you think there may be a problem with your PA request, use our live chat feature at the bottom right.  For [place] requests, please call [phone].

## 2019-04-04 NOTE — Telephone Encounter (Signed)
Will need to change to testosterone gel 1.62%, will send prescription

## 2019-04-04 NOTE — Telephone Encounter (Signed)
Called pt and informed him of this medication change. Pt verbalized understanding.

## 2019-04-04 NOTE — Telephone Encounter (Signed)
Received fax from [employer] stating that the pt has been denied coverage for Testosterone 50mg /5G gel.  Reason for denial is: Pt must try and fail generic testosterone 1.62% AND one of the following: Testosterone Cypionate and Testosterone enanthate.  How would you like to proceed?

## 2019-04-06 ENCOUNTER — Other Ambulatory Visit: Payer: Self-pay | Admitting: Endocrinology

## 2019-04-06 MED ORDER — TESTOSTERONE 20.25 MG/ACT (1.62%) TD GEL: 3.0000 | Freq: Every day | TRANSDERMAL | 1 refills | Status: DC

## 2019-04-09 ENCOUNTER — Telehealth: Payer: Self-pay

## 2019-04-09 NOTE — Telephone Encounter (Signed)
PA initiated via [email or web address] for Testosterone 1.62% gel.   Cephas Volker Key: [id number] - PA Case ID: [id number] - Rx #: [id number] Need help? Call us at [phone] Outcome Additional Information Required There is an EOC that was clinically denied within the last 60 days. This request needs to be sent to the [employer] and [employer]. at [facility]. Appeal requests must come from the member or the provider. Drug Testosterone 1.62% gel Form [employer] Electronic PA Form

## 2019-04-12 ENCOUNTER — Other Ambulatory Visit: Payer: Self-pay | Admitting: Endocrinology

## 2019-04-15 NOTE — Telephone Encounter (Signed)
Insurance was called to follow up on PA, and insurance states that patient's coverage ended on 02/27/19. MyChart message sent to pt to obtain a copy of pts new insurance. Pt was advised that PA cannot proceed until we have the information for his new insurance.

## 2019-04-15 NOTE — Telephone Encounter (Signed)
Patient called today and I advised him that PA for testosterone was faxed today-he stated an understanding

## 2019-04-28 ENCOUNTER — Other Ambulatory Visit: Payer: Self-pay

## 2019-04-28 ENCOUNTER — Other Ambulatory Visit (INDEPENDENT_AMBULATORY_CARE_PROVIDER_SITE_OTHER): Payer: Medicare PPO

## 2019-04-28 ENCOUNTER — Telehealth: Payer: Self-pay

## 2019-04-28 DIAGNOSIS — E559 Vitamin D deficiency, unspecified: Secondary | ICD-10-CM | POA: Diagnosis not present

## 2019-04-28 DIAGNOSIS — E1065 Type 1 diabetes mellitus with hyperglycemia: Secondary | ICD-10-CM | POA: Diagnosis not present

## 2019-04-28 DIAGNOSIS — E291 Testicular hypofunction: Secondary | ICD-10-CM | POA: Diagnosis not present

## 2019-04-28 LAB — COMPREHENSIVE METABOLIC PANEL
ALT: 32 U/L (ref 0–53)
AST: 19 U/L (ref 0–37)
Albumin: 3.7 g/dL (ref 3.5–5.2)
Alkaline Phosphatase: 74 U/L (ref 39–117)
BUN: 19 mg/dL (ref 6–23)
CO2: 26 mEq/L (ref 19–32)
Calcium: 8.9 mg/dL (ref 8.4–10.5)
Chloride: 107 mEq/L (ref 96–112)
Creatinine, Ser: 1.36 mg/dL (ref 0.40–1.50)
GFR: 51.6 mL/min — ABNORMAL LOW (ref >60.00)
Glucose, Bld: 161 mg/dL — ABNORMAL HIGH (ref 70–99)
Potassium: 3.7 mEq/L (ref 3.5–5.1)
Sodium: 141 mEq/L (ref 135–145)
Total Bilirubin: 0.3 mg/dL (ref 0.2–1.2)
Total Protein: 6.3 g/dL (ref 6.0–8.3)

## 2019-04-28 LAB — CBC
HCT: 39.6 % (ref 39.0–52.0)
Hemoglobin: 13.5 g/dL (ref 13.0–17.0)
MCHC: 34 g/dL (ref 30.0–36.0)
MCV: 88.6 fl (ref 78.0–100.0)
Platelets: 224 10*3/uL (ref 150.0–400.0)
RBC: 4.48 Mil/uL (ref 4.22–5.81)
RDW: 14 % (ref 11.5–15.5)
WBC: 6.3 10*3/uL (ref 4.0–10.5)

## 2019-04-28 LAB — TESTOSTERONE: Testosterone: 750.65 ng/dL (ref 300.00–890.00)

## 2019-04-28 LAB — LIPID PANEL
Cholesterol: 120 mg/dL (ref 0–200)
HDL: 33.2 mg/dL — ABNORMAL LOW (ref >39.00)
LDL Cholesterol: 62 mg/dL (ref 0–99)
NonHDL: 86.42
Total CHOL/HDL Ratio: 4
Triglycerides: 122 mg/dL (ref 0.0–149.0)
VLDL: 24.4 mg/dL (ref 0.0–40.0)

## 2019-04-28 LAB — VITAMIN D 25 HYDROXY (VIT D DEFICIENCY, FRACTURES): VITD: 68.99 ng/mL (ref 30.00–100.00)

## 2019-04-28 LAB — HEMOGLOBIN A1C: Hgb A1c MFr Bld: 7.2 % — ABNORMAL HIGH (ref 4.6–6.5)

## 2019-04-28 NOTE — Telephone Encounter (Signed)
PA initiated via telephone with pt's insurance company, [facility] for Testosterone 1.62% gel.  Reference # is [id number] and decision will be made and faxed to this office within 72 hours.  Rep from insurance company named Alvie Heidelberg stated that insurance has approved a temporary 30 day supply until PA decision. Called pt to notify him of this.

## 2019-04-29 ENCOUNTER — Other Ambulatory Visit: Payer: Self-pay

## 2019-04-29 NOTE — Telephone Encounter (Signed)
Will call insurance and update.

## 2019-04-29 NOTE — Telephone Encounter (Signed)
Appeal paperwork has been faxed. Waiting on reply from insurance company.

## 2019-04-29 NOTE — Telephone Encounter (Signed)
Appeal paperwork filled out, MD signed, and will be faxed back to insurance.

## 2019-04-29 NOTE — Telephone Encounter (Signed)
Received fax from [employer] stating that the PA for testosterone has been denied. Reason for denial is [employer] does not cover testosterone for age related hypogonadism.

## 2019-04-29 NOTE — Telephone Encounter (Signed)
He does not have age-related hypogonadism. 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

## 2019-04-30 ENCOUNTER — Other Ambulatory Visit: Payer: Self-pay | Admitting: Endocrinology

## 2019-05-01 ENCOUNTER — Encounter: Payer: Self-pay | Admitting: Endocrinology

## 2019-05-01 ENCOUNTER — Other Ambulatory Visit: Payer: Self-pay

## 2019-05-01 ENCOUNTER — Ambulatory Visit: Payer: Medicare PPO | Admitting: Endocrinology

## 2019-05-01 VITALS — BP 130/68 | HR 69 | Ht 67.0 in | Wt 279.2 lb

## 2019-05-01 DIAGNOSIS — E559 Vitamin D deficiency, unspecified: Secondary | ICD-10-CM

## 2019-05-01 DIAGNOSIS — N289 Disorder of kidney and ureter, unspecified: Secondary | ICD-10-CM | POA: Diagnosis not present

## 2019-05-01 DIAGNOSIS — E1065 Type 1 diabetes mellitus with hyperglycemia: Secondary | ICD-10-CM

## 2019-05-01 DIAGNOSIS — E291 Testicular hypofunction: Secondary | ICD-10-CM

## 2019-05-01 MED ORDER — METHYLPHENIDATE HCL ER (CD) 10 MG PO CPCR: 10.0000 mg | ORAL_CAPSULE | ORAL | 0 refills | Status: DC

## 2019-05-01 MED ORDER — TESTOSTERONE 20.25 MG/ACT (1.62%) TD GEL: 3.0000 | Freq: Every day | TRANSDERMAL | 3 refills | Status: DC

## 2019-05-01 NOTE — Telephone Encounter (Signed)
Prescription sent for 150 g supply

## 2019-05-01 NOTE — Patient Instructions (Addendum)
Exercise daily  Use 1 pack of testosterone

## 2019-05-01 NOTE — Progress Notes (Signed)
Patient ID: Paul Adkins, male   DOB: [birth year], 74 y.o.   MRN: [id number]    Reason for visit:   follow-up  Diagnosis: Type 1 diabetes, date of onset 1978  PAST history: He has had persistently poorly controlled diabetes for several years. A1c has been high with usual range 8.2 -9, overall improved since 2013. Compliance with glucose monitoring and diet has been variable and he does better when he is checking more sugars. His A1c was better than usual in 3/14 at 7.5 but he was also having significant hypoglycemia overnight and also occasionally later in the day. At that time he was started on Invokana 300 mg daily which helped him with glucose control and mild weight loss  CURRENT insulin pump brand:  Medtronic   PUMP SETTINGS are:   Basal rates: 2.5 from midnight- 4 AM. 4 AM = 2.9.  8 AM = 2.75.  Total basal insulin 66 units Boluses : 1 unit for 4 g carbs at mealtimes  Glucose target 110-120, sensitivity 1: 20 with active insulin 2hours.  Changing infusion set every 3 days  Non-insulin HYPOGLYCEMIC drugs: Ozempic 1 mg weekly  RECENT history:   His A1c is slightly higher at 7.2 compared to 6.9 Lowest level 6.8   Current management and problems:  He has not been able to use his Dexcom sensor for the last month or 2 and no significant data is available  Data for the last 3 days she was inconsistent readings with some sporadic high readings overnight and midmorning but also no hypoglycemia  His blood sugars are averaging 168 on his meter but is not checking blood sugars very frequently  Mostly fasting readings are relatively better  Occasionally may have infusion site issues causing higher sugars but also on occasion he will eat during the night causing her sugars to be go up  Despite reminders to exercise regularly has done it only once a couple of days ago  Weight is still the same despite continuing Ozempic  Generally not entering his blood sugars in the pump  at the time of her bolus  Only sometimes may not enter his carbohydrates at the time of bolus; does not appear to be bolusing at breakfast consistently based on his carbohydrate intake  He was told to increase his late evening basal rate on the last visit but he has not done so   Changing infusion set every 3-4 days    PRE-MEAL Fasting Lunch Dinner Bedtime Overall  Glucose range:  140-232  91-193  116-164  123-187   Mean/median:      168   POST-MEAL PC Breakfast PC Lunch PC Dinner  Glucose range:  271    Mean/median:        Previous data:   CGM use % of time  99  Average and SD  163, CV 32  Time in range       66%  % Time Above 180  33  % Time above 250  6  % Time Below target  0.9    PRE-MEAL Fasting Lunch Dinner Bedtime Overall  Glucose range:       Mean/median:  151  126  156     POST-MEAL PC Breakfast PC Lunch PC Dinner  Glucose range:     Mean/median:  139  143  224   Wt Readings from Last 3 Encounters:  05/01/19 279 lb 3.2 oz (126.6 kg)  01/31/19 279 lb (126.6 kg)  11/01/18 282 lb (127.9  kg)    Lab Results  Component Value Date   HGBA1C 7.2 (H) 04/28/2019   HGBA1C 6.9 (H) 01/28/2019   HGBA1C 7.4 (H) 10/29/2018   Lab Results  Component Value Date   MICROALBUR 1.9 07/30/2018   LDLCALC 62 04/28/2019   CREATININE 1.36 04/28/2019    OTHER problems addressed today are in review of systems    Lab on 04/28/2019  Component Date Value Ref Range Status  . VITD 04/28/2019 68.99  30.00 - 100.00 ng/mL Final  . WBC 04/28/2019 6.3  4.0 - 10.5 K/uL Final  . RBC 04/28/2019 4.48  4.22 - 5.81 Mil/uL Final  . Platelets 04/28/2019 224.0  150.0 - 400.0 K/uL Final  . Hemoglobin 04/28/2019 13.5  13.0 - 17.0 g/dL Final  . HCT 04/28/2019 39.6  39.0 - 52.0 % Final  . MCV 04/28/2019 88.6  78.0 - 100.0 fl Final  . MCHC 04/28/2019 34.0  30.0 - 36.0 g/dL Final  . RDW 04/28/2019 14.0  11.5 - 15.5 % Final  . Testosterone 04/28/2019 750.65  300.00 - 890.00 ng/dL Final  .  Cholesterol 04/28/2019 120  0 - 200 mg/dL Final   ATP III Classification       Desirable:  < 200 mg/dL               Borderline High:  200 - 239 mg/dL          High:  > = 240 mg/dL  . Triglycerides 04/28/2019 122.0  0.0 - 149.0 mg/dL Final   Normal:  <150 mg/dLBorderline High:  150 - 199 mg/dL  . HDL 04/28/2019 33.20* >39.00 mg/dL Final  . VLDL 04/28/2019 24.4  0.0 - 40.0 mg/dL Final  . LDL Cholesterol 04/28/2019 62  0 - 99 mg/dL Final  . Total CHOL/HDL Ratio 04/28/2019 4   Final                  Men          Women1/2 Average Risk     3.4          3.3Average Risk          5.0          4.42X Average Risk          9.6          7.13X Average Risk          15.0          11.0                      . NonHDL 04/28/2019 86.42   Final   NOTE:  Non-HDL goal should be 30 mg/dL higher than patient's LDL goal (i.e. LDL goal of < 70 mg/dL, would have non-HDL goal of < 100 mg/dL)  . Sodium 04/28/2019 141  135 - 145 mEq/L Final  . Potassium 04/28/2019 3.7  3.5 - 5.1 mEq/L Final  . Chloride 04/28/2019 107  96 - 112 mEq/L Final  . CO2 04/28/2019 26  19 - 32 mEq/L Final  . Glucose, Bld 04/28/2019 161* 70 - 99 mg/dL Final  . BUN 04/28/2019 19  6 - 23 mg/dL Final  . Creatinine, Ser 04/28/2019 1.36  0.40 - 1.50 mg/dL Final  . Total Bilirubin 04/28/2019 0.3  0.2 - 1.2 mg/dL Final  . Alkaline Phosphatase 04/28/2019 74  39 - 117 U/L Final  . AST 04/28/2019 19  0 - 37 U/L Final  . ALT 04/28/2019 32  0 - 53 U/L Final  . Total Protein 04/28/2019 6.3  6.0 - 8.3 g/dL Final  . Albumin 04/28/2019 3.7  3.5 - 5.2 g/dL Final  . GFR 04/28/2019 51.60* >60.00 mL/min Final  . Calcium 04/28/2019 8.9  8.4 - 10.5 mg/dL Final  . Hgb A1c MFr Bld 04/28/2019 7.2* 4.6 - 6.5 % Final   Glycemic Control Guidelines for People with Diabetes:Non Diabetic:  <6%Goal of Therapy: <7%Additional Action Suggested:  >8%     Allergies as of 05/01/2019      Reactions   Viagra [sildenafil Citrate]       Medication List       Accurate as of  May 01, 2019  8:48 AM. If you have any questions, ask your nurse or doctor.        amLODipine 5 MG tablet Commonly known as: NORVASC TAKE 1 TABLET BY MOUTH EVERY DAY   aspirin 81 MG tablet Take 81 mg by mouth daily.   atorvastatin 20 MG tablet Commonly known as: LIPITOR TAKE 1 TABLET BY MOUTH EVERY DAY   furosemide 20 MG tablet Commonly known as: LASIX TAKE 1 TABLET BY MOUTH EVERY DAY What changed: when to take this   glucose blood test strip Commonly known as: Visual merchandiser Next Test Use as instructed to check blood sugars 4 times per day dx code E10.65   insulin lispro 100 UNIT/ML injection Commonly known as: HumaLOG USE MAX OF 170 UNITS UNDER THE SKIN DAILY VIA INSULIN PUMP.   methylphenidate 10 MG CR capsule Commonly known as: Metadate CD Take 1 capsule (10 mg total) by mouth every morning. What changed:   when to take this  reasons to take this  additional instructions   olmesartan 5 MG tablet Commonly known as: BENICAR TAKE 1 TABLET BY MOUTH EVERY DAY   Ozempic (1 MG/DOSE) 2 MG/1.5ML Sopn Generic drug: Semaglutide (1 MG/DOSE) INJECT 1 MG INTO THE SKIN ONCE A WEEK. INJECT 1MG  UNDER THE SKIN ONCE WEEKLY.   PARoxetine 40 MG tablet Commonly known as: PAXIL TAKE 1 TABLET BY MOUTH EVERYDAY AT BEDTIME   Testosterone 20.25 MG/ACT (1.62%) Gel Place 3 Pump onto the skin daily.   Vitamin D 50 MCG (2000 UT) Caps Take by mouth. Takes 6000 daily       Allergies:  Allergies  Allergen Reactions  . Viagra [Sildenafil Citrate]     Past Medical History:  Diagnosis Date  . Carpal tunnel syndrome   . Depression   . Diabetes mellitus without complication ([place])   . ED (erectile dysfunction)   . Edema   . Hyperlipidemia   . Hypertension   . Lumbar disc disease   . Neuropathy   . Stroke [facility])     Past Surgical History:  Procedure Laterality Date  . BACK SURGERY    . VASECTOMY      Family History  Problem Relation Age of Onset  . Cancer Father         Prostate  . Diabetes Neg Hx     Social History:  reports that he has never smoked. He has never used smokeless tobacco. He reports that he does not drink alcohol or use drugs.  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   Had been on AndroGel 5 g packets, prescribed 1- 1-1/2 packets daily Had been much more regular with his regimen recently and his testosterone has gone up to 750 However now his insurance wants him to have AndroGel 1.62% pump but is still waiting for prior authorization  Usually feels better when he is taking the full dose regularly Testosterone level as follows:   Lab Results  Component Value Date   TESTOSTERONE 750.65 04/28/2019     HYPERTENSION: Controlled with  Benicar 5 mg along with  5 mg amlodipine  Also has some edema of ankles at times, occasionally needs Lasix, does not like to take it regularly because of diuresis  BP Readings from Last 3 Encounters:  05/01/19 130/68  01/31/19 134/84  11/01/18 138/60      His creatinine is upper normal with some fluctuation Previously slightly higher with Invokana No microalbuminuria   Lab Results  Component Value Date   CREATININE 1.36 04/28/2019   CREATININE 1.26 01/28/2019   CREATININE 1.25 10/29/2018     Hypercholesterolemia: Has been controlled on Lipitor 20 mg ``   Lab Results   Component Value Date   CHOL 120 04/28/2019   HDL 33.20 (L) 04/28/2019   LDLCALC 62 04/28/2019   LDLDIRECT 67.0 08/02/2015   TRIG 122.0 04/28/2019   CHOLHDL 4 04/28/2019    Has history of retinopathy, has regular follow-up with specialist    EXAM:   BP 130/68 (BP Location: Left Arm, Patient Position: Sitting, Cuff Size: Large)   Pulse 69   Ht 5\' 7"  (1.702 m)   Wt 279 lb 3.2 oz (126.6 kg)   SpO2 97%   BMI 43.73 kg/m      ASSESSMENT/PLAN:  DIABETES Type I and obesity:  See history of present illness for detailed discussion on current blood sugar patterns and problems identified  His A1c is back up to 7.2 compared to 6.9   Blood sugars are difficult to assess as he has not used his CGM recently and no data is available except for 3 days As usual his blood sugars fluctuate based on diet and also sporadically has high readings overnight Still likely benefiting from [place] since weight has leveled off He still is not motivated to exercise regularly  He is a candidate for the 770 pump but currently still not approved He is somewhat reluctant to try different brand of pump at this time  Recommendations: Continue using the Dexcom He will try to adjust his boluses and blood sugar patterns based on his CGM results To call if he has consistently high readings especially overnight Would be desirable to enter his blood sugars more consistently in the pump to help proper correction of high readings Start regular exercise with his exercise bike Again will look into the closed-loop pumps on the next visit depending on availability  HYPOGONADISM: He is getting much better testosterone levels with regular administration of his AndroGel packets However now is supposed to switch to AndroGel pump and still trying to get prior authorization done  Will recheck his testosterone level on the next visit with the new regimen   HYPERTENSION: Blood  pressure is fairly well controlled To  stay on Benicar 5 mg  Renal dysfunction: Still has mild variability, not from diabetes  Shingrix vaccine #2 to be given on the next visit  LIPIDS: Well-controlled as of recent labs  Vitamin D deficiency: He is taking 6000 units daily consistently and his levels are fairly consistently in the upper normal range  Follow-up in 3 months  Total visit time for evaluation and management of multiple problems and counseling =25 minutes   There are no Patient Instructions on file for this visit.     Elayne Snare 05/01/2019

## 2019-05-01 NOTE — Telephone Encounter (Signed)
Received fax back from [facility] after appealing decision to deny testosterone. While rationale previously stated as the reason for denial has been removed, insurance now denied because they stated that 225G is too much per 30 day period.

## 2019-07-05 ENCOUNTER — Other Ambulatory Visit: Payer: Self-pay | Admitting: Endocrinology

## 2019-07-18 ENCOUNTER — Other Ambulatory Visit: Payer: Self-pay | Admitting: Endocrinology

## 2019-07-18 DIAGNOSIS — E559 Vitamin D deficiency, unspecified: Secondary | ICD-10-CM | POA: Insufficient documentation

## 2019-07-18 DIAGNOSIS — F909 Attention-deficit hyperactivity disorder, unspecified type: Secondary | ICD-10-CM | POA: Insufficient documentation

## 2019-07-31 ENCOUNTER — Other Ambulatory Visit: Payer: Self-pay | Admitting: Endocrinology

## 2019-08-01 ENCOUNTER — Ambulatory Visit: Payer: Medicare Other | Admitting: Endocrinology

## 2019-08-01 ENCOUNTER — Ambulatory Visit: Payer: Medicare Other

## 2019-08-04 ENCOUNTER — Other Ambulatory Visit: Payer: Self-pay

## 2019-08-04 ENCOUNTER — Other Ambulatory Visit (INDEPENDENT_AMBULATORY_CARE_PROVIDER_SITE_OTHER): Payer: Medicare PPO

## 2019-08-04 DIAGNOSIS — E1065 Type 1 diabetes mellitus with hyperglycemia: Secondary | ICD-10-CM | POA: Diagnosis not present

## 2019-08-04 DIAGNOSIS — E291 Testicular hypofunction: Secondary | ICD-10-CM | POA: Diagnosis not present

## 2019-08-04 LAB — URINALYSIS, ROUTINE W REFLEX MICROSCOPIC
Bilirubin Urine: NEGATIVE
Hgb urine dipstick: NEGATIVE
Ketones, ur: NEGATIVE
Leukocytes,Ua: NEGATIVE
Nitrite: NEGATIVE
RBC / HPF: NONE SEEN (ref 0–?)
Specific Gravity, Urine: 1.025 (ref 1.000–1.030)
Total Protein, Urine: NEGATIVE
Urine Glucose: NEGATIVE
Urobilinogen, UA: 0.2 (ref 0.0–1.0)
pH: 5.5 (ref 5.0–8.0)

## 2019-08-04 LAB — COMPREHENSIVE METABOLIC PANEL
ALT: 26 U/L (ref 0–53)
AST: 16 U/L (ref 0–37)
Albumin: 3.8 g/dL (ref 3.5–5.2)
Alkaline Phosphatase: 76 U/L (ref 39–117)
BUN: 27 mg/dL — ABNORMAL HIGH (ref 6–23)
CO2: 25 mEq/L (ref 19–32)
Calcium: 8.5 mg/dL (ref 8.4–10.5)
Chloride: 106 mEq/L (ref 96–112)
Creatinine, Ser: 1.41 mg/dL (ref 0.40–1.50)
GFR: 49.46 mL/min — ABNORMAL LOW (ref >60.00)
Glucose, Bld: 190 mg/dL — ABNORMAL HIGH (ref 70–99)
Potassium: 3.9 mEq/L (ref 3.5–5.1)
Sodium: 138 mEq/L (ref 135–145)
Total Bilirubin: 0.4 mg/dL (ref 0.2–1.2)
Total Protein: 6 g/dL (ref 6.0–8.3)

## 2019-08-04 LAB — TESTOSTERONE: Testosterone: 325.47 ng/dL (ref 300.00–890.00)

## 2019-08-04 LAB — CBC
HCT: 38.9 % — ABNORMAL LOW (ref 39.0–52.0)
Hemoglobin: 13.3 g/dL (ref 13.0–17.0)
MCHC: 34.3 g/dL (ref 30.0–36.0)
MCV: 89 fl (ref 78.0–100.0)
Platelets: 211 10*3/uL (ref 150.0–400.0)
RBC: 4.37 Mil/uL (ref 4.22–5.81)
RDW: 14.1 % (ref 11.5–15.5)
WBC: 6.4 10*3/uL (ref 4.0–10.5)

## 2019-08-04 LAB — MICROALBUMIN / CREATININE URINE RATIO
Creatinine,U: 178.4 mg/dL
Microalb Creat Ratio: 0.6 mg/g (ref 0.0–30.0)
Microalb, Ur: 1 mg/dL (ref 0.0–1.9)

## 2019-08-04 LAB — HEMOGLOBIN A1C: Hgb A1c MFr Bld: 6.9 % — ABNORMAL HIGH (ref 4.6–6.5)

## 2019-08-06 ENCOUNTER — Encounter (INDEPENDENT_AMBULATORY_CARE_PROVIDER_SITE_OTHER): Payer: Medicare Other | Admitting: Ophthalmology

## 2019-08-06 ENCOUNTER — Other Ambulatory Visit: Payer: Self-pay

## 2019-08-06 DIAGNOSIS — E11319 Type 2 diabetes mellitus with unspecified diabetic retinopathy without macular edema: Secondary | ICD-10-CM | POA: Diagnosis not present

## 2019-08-06 DIAGNOSIS — E113592 Type 2 diabetes mellitus with proliferative diabetic retinopathy without macular edema, left eye: Secondary | ICD-10-CM

## 2019-08-06 DIAGNOSIS — D3132 Benign neoplasm of left choroid: Secondary | ICD-10-CM

## 2019-08-06 DIAGNOSIS — E113391 Type 2 diabetes mellitus with moderate nonproliferative diabetic retinopathy without macular edema, right eye: Secondary | ICD-10-CM

## 2019-08-06 DIAGNOSIS — I10 Essential (primary) hypertension: Secondary | ICD-10-CM

## 2019-08-06 DIAGNOSIS — H43813 Vitreous degeneration, bilateral: Secondary | ICD-10-CM

## 2019-08-06 DIAGNOSIS — H35033 Hypertensive retinopathy, bilateral: Secondary | ICD-10-CM

## 2019-08-06 LAB — HM DIABETES EYE EXAM

## 2019-08-07 ENCOUNTER — Encounter: Payer: Self-pay | Admitting: Endocrinology

## 2019-08-07 ENCOUNTER — Ambulatory Visit: Payer: Medicare PPO | Admitting: Endocrinology

## 2019-08-07 VITALS — BP 130/50 | HR 76 | Ht 67.0 in | Wt 278.4 lb

## 2019-08-07 DIAGNOSIS — Z23 Encounter for immunization: Secondary | ICD-10-CM

## 2019-08-07 DIAGNOSIS — E1065 Type 1 diabetes mellitus with hyperglycemia: Secondary | ICD-10-CM | POA: Diagnosis not present

## 2019-08-07 DIAGNOSIS — E291 Testicular hypofunction: Secondary | ICD-10-CM | POA: Diagnosis not present

## 2019-08-07 DIAGNOSIS — I10 Essential (primary) hypertension: Secondary | ICD-10-CM

## 2019-08-07 DIAGNOSIS — N1831 Chronic kidney disease, stage 3a: Secondary | ICD-10-CM | POA: Diagnosis not present

## 2019-08-07 DIAGNOSIS — E559 Vitamin D deficiency, unspecified: Secondary | ICD-10-CM

## 2019-08-07 NOTE — Patient Instructions (Addendum)
3 pumps of Androgel  Ozempiv 0.5 weekly

## 2019-08-07 NOTE — Progress Notes (Signed)
Per orders of Dr. Dwyane Dee injection of Shingrix given today by N.Marcianna Daily,LPN. Patient tolerated injection well.

## 2019-08-07 NOTE — Progress Notes (Signed)
Patient ID: Paul Adkins, male   DOB: [birth year], 74 y.o.   MRN: [id number]    Reason for visit:   follow-up  Diagnosis: Type 1 diabetes, date of onset 1978  PAST history: He has had persistently poorly controlled diabetes for several years. A1c has been high with usual range 8.2 -9, overall improved since 2013. Compliance with glucose monitoring and diet has been variable and he does better when he is checking more sugars. His A1c was better than usual in 3/14 at 7.5 but he was also having significant hypoglycemia overnight and also occasionally later in the day. At that time he was started on Invokana 300 mg daily which helped him with glucose control and mild weight loss  CURRENT insulin pump brand:  Medtronic   PUMP SETTINGS are:   Basal rates: 2.5 from midnight- 4 AM. 4 AM = 2.9.  8 AM = 2.75.  Total basal insulin 66 units Boluses : 1 unit for 4 g carbs at mealtimes  Glucose target 110-120, sensitivity 1: 20 with active insulin 2hours.  Changing infusion set every 3 days  Non-insulin HYPOGLYCEMIC drugs: Ozempic 1 mg weekly  RECENT history:   His A1c is slightly higher at 7.2 compared to 6.9 Lowest level 6.8   Current management and problems:  He has very stable blood sugars as seen by his last 2 weeks CGM recording as discussed below  Still has some high readings late at night from eating more carbohydrates but not consistent  Also appears to have a dawn phenomenon which is variable  Low blood sugars are minimal although tending to be low over the last 2 nights.  He will treat the low sugars with a fruit  He is not exercising  Weight is about the same  He finds that he is having increased satiety with Ozempic although he also sometimes feels a little nauseated and has lack of taste with this.   Changing infusion set every 3-4 days    CONTINUOUS GLUCOSE MONITORING RECORD INTERPRETATION    Dates of Recording: Last 2 weeks  Sensor description:  G6  Results statistics:   CGM use % of time  99  Average and SD  140, +/-39  Time in range       85%  % Time Above 180  14  % Time above 250  1.4  % Time Below target  0.7    Glycemic patterns summary: Overall blood sugars are fairly stable throughout the day with some variability. Highest blood sugars are between 10 PM-11 PM usually averaging 153 Lowest blood sugars around 2 AM or 1 PM averaging 125 at both times   Hyperglycemic episodes are occurring variably early morning around 5 AM and periodically around 5-7 PM on 10-11 PM  Hypoglycemic episodes have been minimal and only occasionally overnight and around 8-10 PM  Overnight periods: Blood sugars are variable with some tendency to lower readings the last 2 nights below 70 at times but otherwise either generally flat tending to rise after about 2-3 AM  Preprandial periods: Blood sugars are averaging about 150 at breakfast, 120 at lunch and about 140 at dinnertime  Postprandial periods:   No consistent excessive rise in blood sugar with only occasional blood sugar spikes reviewed the after 9 PM and rarely midday  Previous blood sugars:  PRE-MEAL Fasting Lunch Dinner Bedtime Overall  Glucose range:  140-232  91-193  116-164  123-187   Mean/median:      168  POST-MEAL PC Breakfast PC Lunch PC Dinner  Glucose range:  271    Mean/median:       Wt Readings from Last 3 Encounters:  08/07/19 278 lb 6.4 oz (126.3 kg)  05/01/19 279 lb 3.2 oz (126.6 kg)  01/31/19 279 lb (126.6 kg)    Lab Results  Component Value Date   HGBA1C 6.9 (H) 08/04/2019   HGBA1C 7.2 (H) 04/28/2019   HGBA1C 6.9 (H) 01/28/2019   Lab Results  Component Value Date   MICROALBUR 1.0 08/04/2019   LDLCALC 62 04/28/2019   CREATININE 1.41 08/04/2019    OTHER problems addressed today are in review of systems    Lab on 08/04/2019  Component Date Value Ref Range Status  . WBC 08/04/2019 6.4  4.0 - 10.5 K/uL Final  . RBC 08/04/2019 4.37  4.22 -  5.81 Mil/uL Final  . Platelets 08/04/2019 211.0  150 - 400 K/uL Final  . Hemoglobin 08/04/2019 13.3  13.0 - 17.0 g/dL Final  . HCT 08/04/2019 38.9* 39 - 52 % Final  . MCV 08/04/2019 89.0  78.0 - 100.0 fl Final  . MCHC 08/04/2019 34.3  30.0 - 36.0 g/dL Final  . RDW 08/04/2019 14.1  11.5 - 15.5 % Final  . Testosterone 08/04/2019 325.47  300.00 - 890.00 ng/dL Final  . Color, Urine 08/04/2019 YELLOW  Yellow;Lt. Yellow;Straw;Dark Yellow;Amber;Green;Red;Brown Final  . APPearance 08/04/2019 CLEAR  Clear;Turbid;Slightly Cloudy;Cloudy Final  . Specific Gravity, Urine 08/04/2019 1.025  1.000 - 1.030 Final  . pH 08/04/2019 5.5  5.0 - 8.0 Final  . Total Protein, Urine 08/04/2019 NEGATIVE  Negative Final  . Urine Glucose 08/04/2019 NEGATIVE  Negative Final  . Ketones, ur 08/04/2019 NEGATIVE  Negative Final  . Bilirubin Urine 08/04/2019 NEGATIVE  Negative Final  . Hgb urine dipstick 08/04/2019 NEGATIVE  Negative Final  . Urobilinogen, UA 08/04/2019 0.2  0.0 - 1.0 Final  . Leukocytes,Ua 08/04/2019 NEGATIVE  Negative Final  . Nitrite 08/04/2019 NEGATIVE  Negative Final  . WBC, UA 08/04/2019 0-2/hpf  0-2/hpf Final  . RBC / HPF 08/04/2019 none seen  0-2/hpf Final  . Squamous Epithelial / LPF 08/04/2019 Rare(0-4/hpf)  Rare(0-4/hpf) Final  . Microalb, Ur 08/04/2019 1.0  0.0 - 1.9 mg/dL Final  . Creatinine,U 08/04/2019 178.4  mg/dL Final  . Microalb Creat Ratio 08/04/2019 0.6  0.0 - 30.0 mg/g Final  . Sodium 08/04/2019 138  135 - 145 mEq/L Final  . Potassium 08/04/2019 3.9  3.5 - 5.1 mEq/L Final  . Chloride 08/04/2019 106  96 - 112 mEq/L Final  . CO2 08/04/2019 25  19 - 32 mEq/L Final  . Glucose, Bld 08/04/2019 190* 70 - 99 mg/dL Final  . BUN 08/04/2019 27* 6 - 23 mg/dL Final  . Creatinine, Ser 08/04/2019 1.41  0.40 - 1.50 mg/dL Final  . Total Bilirubin 08/04/2019 0.4  0.2 - 1.2 mg/dL Final  . Alkaline Phosphatase 08/04/2019 76  39 - 117 U/L Final  . AST 08/04/2019 16  0 - 37 U/L Final  . ALT  08/04/2019 26  0 - 53 U/L Final  . Total Protein 08/04/2019 6.0  6.0 - 8.3 g/dL Final  . Albumin 08/04/2019 3.8  3.5 - 5.2 g/dL Final  . GFR 08/04/2019 49.46* >60.00 mL/min Final  . Calcium 08/04/2019 8.5  8.4 - 10.5 mg/dL Final  . Hgb A1c MFr Bld 08/04/2019 6.9* 4.6 - 6.5 % Final   Glycemic Control Guidelines for People with Diabetes:Non Diabetic:  <6%Goal of Therapy: <7%Additional Action Suggested:  >  8%     Allergies as of 08/07/2019      Reactions   Viagra [sildenafil Citrate]       Medication List       Accurate as of August 07, 2019  8:36 AM. If you have any questions, ask your nurse or doctor.        STOP taking these medications   methylphenidate 10 MG CR capsule Commonly known as: Metadate CD Stopped by: Elayne Snare, MD     TAKE these medications   amLODipine 5 MG tablet Commonly known as: NORVASC TAKE 1 TABLET BY MOUTH EVERY DAY   aspirin 81 MG tablet Take 81 mg by mouth daily.   atorvastatin 20 MG tablet Commonly known as: LIPITOR TAKE 1 TABLET BY MOUTH EVERY DAY   furosemide 20 MG tablet Commonly known as: LASIX TAKE 1 TABLET BY MOUTH EVERY DAY What changed: when to take this   glucose blood test strip Commonly known as: Visual merchandiser Next Test Use as instructed to check blood sugars 4 times per day dx code E10.65   insulin lispro 100 UNIT/ML injection Commonly known as: HumaLOG USE MAX OF 170 UNITS UNDER THE SKIN DAILY VIA INSULIN PUMP.   olmesartan 5 MG tablet Commonly known as: BENICAR TAKE 1 TABLET BY MOUTH EVERY DAY   Ozempic (1 MG/DOSE) 2 MG/1.5ML Sopn Generic drug: Semaglutide (1 MG/DOSE) INJECT 1 MG INTO THE SKIN ONCE A WEEK. INJECT 1MG  UNDER THE SKIN ONCE WEEKLY.   PARoxetine 40 MG tablet Commonly known as: PAXIL TAKE 1 TABLET BY MOUTH EVERYDAY AT BEDTIME   Testosterone 20.25 MG/ACT (1.62%) Gel Place 3 Pump onto the skin daily.   Vitamin D 50 MCG (2000 UT) Caps Take by mouth. Takes 6000 daily       Allergies:  Allergies   Allergen Reactions  . Viagra [Sildenafil Citrate]     Past Medical History:  Diagnosis Date  . Carpal tunnel syndrome   . Depression   . Diabetes mellitus without complication ([place])   . ED (erectile dysfunction)   . Edema   . Hyperlipidemia   . Hypertension   . Lumbar disc disease   . Neuropathy   . Stroke [facility])     Past Surgical History:  Procedure Laterality Date  . BACK SURGERY    . VASECTOMY      Family History  Problem Relation Age of Onset  . Cancer Father        Prostate  . Diabetes Neg Hx     Social History:  reports that he has never smoked. He has never used smokeless tobacco. He reports that he does not drink alcohol and does not use drugs.  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   Had been on AndroGel 1.62% pump instead of the packets He was using 3 pumps daily with fairly good energy level He however went up to 4 pumps  on his own when he saw his lab results 3 days ago Previously his level had been relatively high No change in hemoglobin  Testosterone level as follows:   Lab Results  Component Value Date   TESTOSTERONE 325.47 08/04/2019     HYPERTENSION: Controlled with  Benicar 5 mg along with  5 mg amlodipine  Also has some edema of ankles at times, rarely needing Lasix  BP Readings from Last 3 Encounters:  08/07/19 (!) 130/50  05/01/19 130/68  01/31/19 134/84      His  creatinine is upper normal with some recent increase but minimal and within the normal range for his age, GFR 17 Previously slightly higher with Invokana No microalbuminuria   Lab Results  Component Value Date   CREATININE 1.41 08/04/2019   CREATININE 1.36 04/28/2019   CREATININE 1.26 01/28/2019     Hypercholesterolemia: Has been controlled on Lipitor 20 mg ``   Lab Results  Component Value Date   CHOL 120 04/28/2019   HDL 33.20 (L) 04/28/2019   LDLCALC 62 04/28/2019   LDLDIRECT 67.0 08/02/2015   TRIG 122.0 04/28/2019   CHOLHDL 4 04/28/2019    Has history of retinopathy, has regular follow-up with specialist    EXAM:   BP (!) 130/50 (BP Location: Left Arm, Patient Position: Sitting, Cuff Size: Large)   Pulse 76   Ht 5\' 7"  (1.702 m)   Wt 278 lb 6.4 oz (126.3 kg)   SpO2 98%   BMI 43.60 kg/m      ASSESSMENT/PLAN:  DIABETES Type I and obesity:  See history of present illness for detailed discussion on current blood sugar patterns and problems identified  His A1c is back 7%  He is doing well with the help of the CGM and blood sugars are relatively stable except for occasional rise postprandially and occasional low normal sugars with mild transient hypoglycemia as above Ozempic appears to help his postprandial blood sugars and also cut down his desire to overeat in the evenings Still not motivated to exercise  Recommendations: Continue using the Dexcom Check to see if his pump is ready for upgrade and may consider T-insulin May try 0.5 Ozempic to reduce possible side effects Regular exercise If his blood sugars are tending to be low overnight he can reduce midnight basal rate by 0.15  HYPOGONADISM: He is subjectively doing well and considering his age his level of 325 is adequate Discussed that he should not increase the dose to 4 pumps daily for now but make sure he takes it daily.  We will recheck on the next visit    HYPERTENSION: Blood pressure is well  controlled To stay on Benicar 5 mg  Renal dysfunction: Still has mild variability, not from diabetes  Shingrix vaccine #2 given today  LIPIDS: Recheck on next visit   There are no Patient Instructions on file for this visit.     Elayne Snare 08/07/2019

## 2019-08-23 ENCOUNTER — Other Ambulatory Visit: Payer: Self-pay | Admitting: Endocrinology

## 2019-08-31 ENCOUNTER — Other Ambulatory Visit: Payer: Self-pay | Admitting: Endocrinology

## 2019-09-05 ENCOUNTER — Other Ambulatory Visit: Payer: Self-pay | Admitting: Endocrinology

## 2019-09-05 NOTE — Telephone Encounter (Signed)
Please refill if appropriate

## 2019-10-29 ENCOUNTER — Other Ambulatory Visit: Payer: Medicare PPO

## 2019-10-29 ENCOUNTER — Other Ambulatory Visit (INDEPENDENT_AMBULATORY_CARE_PROVIDER_SITE_OTHER): Payer: Medicare PPO

## 2019-10-29 DIAGNOSIS — E291 Testicular hypofunction: Secondary | ICD-10-CM

## 2019-10-29 DIAGNOSIS — E559 Vitamin D deficiency, unspecified: Secondary | ICD-10-CM | POA: Diagnosis not present

## 2019-10-29 DIAGNOSIS — E1065 Type 1 diabetes mellitus with hyperglycemia: Secondary | ICD-10-CM

## 2019-10-29 LAB — COMPREHENSIVE METABOLIC PANEL
ALT: 26 U/L (ref 0–53)
AST: 14 U/L (ref 0–37)
Albumin: 3.9 g/dL (ref 3.5–5.2)
Alkaline Phosphatase: 67 U/L (ref 39–117)
BUN: 28 mg/dL — ABNORMAL HIGH (ref 6–23)
CO2: 27 mEq/L (ref 19–32)
Calcium: 8.6 mg/dL (ref 8.4–10.5)
Chloride: 107 mEq/L (ref 96–112)
Creatinine, Ser: 1.65 mg/dL — ABNORMAL HIGH (ref 0.40–1.50)
GFR: 41.22 mL/min — ABNORMAL LOW (ref >60.00)
Glucose, Bld: 76 mg/dL (ref 70–99)
Potassium: 4 mEq/L (ref 3.5–5.1)
Sodium: 141 mEq/L (ref 135–145)
Total Bilirubin: 0.4 mg/dL (ref 0.2–1.2)
Total Protein: 6.2 g/dL (ref 6.0–8.3)

## 2019-10-29 LAB — LIPID PANEL
Cholesterol: 119 mg/dL (ref 0–200)
HDL: 34.1 mg/dL — ABNORMAL LOW (ref >39.00)
LDL Cholesterol: 50 mg/dL (ref 0–99)
NonHDL: 84.9
Total CHOL/HDL Ratio: 3
Triglycerides: 173 mg/dL — ABNORMAL HIGH (ref 0.0–149.0)
VLDL: 34.6 mg/dL (ref 0.0–40.0)

## 2019-10-29 LAB — TESTOSTERONE: Testosterone: 311.55 ng/dL (ref 300.00–890.00)

## 2019-10-29 LAB — VITAMIN D 25 HYDROXY (VIT D DEFICIENCY, FRACTURES): VITD: 58.95 ng/mL (ref 30.00–100.00)

## 2019-10-29 LAB — HEMOGLOBIN A1C: Hgb A1c MFr Bld: 6.8 % — ABNORMAL HIGH (ref 4.6–6.5)

## 2019-11-02 ENCOUNTER — Other Ambulatory Visit: Payer: Self-pay | Admitting: Endocrinology

## 2019-11-08 ENCOUNTER — Other Ambulatory Visit: Payer: Self-pay | Admitting: Endocrinology

## 2019-11-10 ENCOUNTER — Encounter: Payer: Self-pay | Admitting: Endocrinology

## 2019-11-10 ENCOUNTER — Other Ambulatory Visit: Payer: Self-pay

## 2019-11-10 ENCOUNTER — Ambulatory Visit: Payer: Medicare PPO | Admitting: Endocrinology

## 2019-11-10 VITALS — BP 130/52 | HR 70 | Ht 67.0 in | Wt 282.0 lb

## 2019-11-10 DIAGNOSIS — E1065 Type 1 diabetes mellitus with hyperglycemia: Secondary | ICD-10-CM

## 2019-11-10 DIAGNOSIS — N1831 Chronic kidney disease, stage 3a: Secondary | ICD-10-CM

## 2019-11-10 DIAGNOSIS — E291 Testicular hypofunction: Secondary | ICD-10-CM

## 2019-11-10 NOTE — Patient Instructions (Addendum)
Hold Olmesartan  Bolus 10 min ahead of time  Regular exercise  4 pumps daily

## 2019-11-10 NOTE — Progress Notes (Signed)
Patient ID: Paul Adkins, male   DOB: December 13, 1945, 74 y.o.   MRN: [id number]    Reason for visit:   follow-up  Diagnosis: Type 1 diabetes, date of onset 1978  PAST history: He has had persistently poorly controlled diabetes for several years. A1c has been high with usual range 8.2 -9, overall improved since 2013. Compliance with glucose monitoring and diet has been variable and he does better when he is checking more sugars. His A1c was better than usual in 3/14 at 7.5 but he was also having significant hypoglycemia overnight and also occasionally later in the day. At that time he was started on Invokana 300 mg daily which helped him with glucose control and mild weight loss  CURRENT insulin pump brand:  Medtronic [id number]  PUMP SETTINGS are:   Basal rates: 2.5 from midnight- 4 AM. 4 AM = 2.9.  8 AM = 2.75.  Total basal insulin 66 units Boluses : 1 unit for 4 g carbs at mealtimes  Glucose target 110-120, sensitivity 1: 20 with active insulin 2hours.  Changing infusion set every 3 days  Non-insulin HYPOGLYCEMIC drugs: Ozempic 1 mg weekly  RECENT history:   His A1c is unchanged at 6.9 Lowest level 6.8  Current management and problems:  He has again fairly good control with recent CGM showing 83% of blood sugars within target zone and no significant hypoglycemia  As before he has relatively high readings late at night which is not consistent with blood sugars are above target only around 11 PM on average  He is overriding the pump about 30% of the time, usually trying to give extra insulin even though he has a carbohydrate coverage ratio 1:4  With this occasionally his blood sugar may be low normal postprandially including after evening meal  He is trying to eat relatively low carbohydrate bread.  However has gained a little weight  Still not exercising and has not uses exercise bike, only a little active with blood work  Blood sugars are likely stabilized with Ozempic and  his nausea is not very prominent now  Contrast bolus insulin: He says he is bolusing right when he is eating   Changing infusion set every 3-4 days    CONTINUOUS GLUCOSE MONITORING RECORD INTERPRETATION    Dates of Recording: Last 2 weeks  Sensor description: G6  Results statistics:   CGM use % of time   Average and SD  135, +/-48  Time in range      83%  % Time Above 180  13  % Time above 250 2  % Time Below target 1     Glycemic patterns summary: Overall blood sugars are fairly level and stable and averaging mostly between 115-130 during the day but starting to rise around 6 PM with the highest average around 10-11 PM.  More variability occurring after about 7 PM also  Hyperglycemic episodes requiring greatly late in the evening usually after 8 PM and only occasionally at other times.  Highest blood sugar was around 350  Hypoglycemic episodes occurred only rarely and transiently around 3 AM preceded by relatively high blood sugar  Overnight periods: Blood sugars are generally fluctuating but on an average within target range, highest around midnight had minimal hypoglycemia as above  Preprandial periods: Blood sugars are averaging about 120 at breakfast and lunch and slightly higher at dinnertime  Postprandial periods:   After breakfast:   Blood sugars are generally fairly level compared to Premeal readings After lunch  also blood sugar fairly good lower Dinnertime may be variable has only occasional blood sugar spikes, also a couple of low normal readings Most of the postprandial drive is after evening snack after 9 PM      CGM use % of time  99  Average and SD  140, +/-39  Time in range       85%  % Time Above 180  14  % Time above 250  1.4  % Time Below target  0.7    Glycemic patterns summary: Overall blood sugars are fairly stable throughout the day with some variability. Highest blood sugars are between 10 PM-11 PM usually averaging 153 Lowest blood sugars  around 2 AM or 1 PM averaging 125 at both times   Hyperglycemic episodes are occurring variably early morning around 5 AM and periodically around 5-7 PM on 10-11 PM  Hypoglycemic episodes have been minimal and only occasionally overnight and around 8-10 PM  Overnight periods: Blood sugars are variable with some tendency to lower readings the last 2 nights below 70 at times but otherwise either generally flat tending to rise after about 2-3 AM  Preprandial periods: Blood sugars are averaging about 150 at breakfast, 120 at lunch and about 140 at dinnertime  Postprandial periods:   No consistent excessive rise in blood sugar with only occasional blood sugar spikes reviewed the after 9 PM and rarely midday    Wt Readings from Last 3 Encounters:  11/10/19 282 lb (127.9 kg)  08/07/19 278 lb 6.4 oz (126.3 kg)  05/01/19 279 lb 3.2 oz (126.6 kg)    Lab Results  Component Value Date   HGBA1C 6.8 (H) 10/29/2019   HGBA1C 6.9 (H) 08/04/2019   HGBA1C 7.2 (H) 04/28/2019   Lab Results  Component Value Date   MICROALBUR 1.0 08/04/2019   LDLCALC 50 10/29/2019   CREATININE 1.65 (H) 10/29/2019    OTHER problems addressed today are in review of systems    No visits with results within 1 Week(s) from this visit.  Latest known visit with results is:  Appointment on 10/29/2019  Component Date Value Ref Range Status  . VITD 10/29/2019 58.95  30.00 - 100.00 ng/mL Final  . Testosterone 10/29/2019 311.55  300.00 - 890.00 ng/dL Final  . Cholesterol 10/29/2019 119  0 - 200 mg/dL Final   ATP III Classification       Desirable:  < 200 mg/dL               Borderline High:  200 - 239 mg/dL          High:  > = 240 mg/dL  . Triglycerides 10/29/2019 173.0* 0 - 149 mg/dL Final   Normal:  <150 mg/dLBorderline High:  150 - 199 mg/dL  . HDL 10/29/2019 34.10* >39.00 mg/dL Final  . VLDL 10/29/2019 34.6  0.0 - 40.0 mg/dL Final  . LDL Cholesterol 10/29/2019 50  0 - 99 mg/dL Final  . Total CHOL/HDL Ratio  10/29/2019 3   Final                  Men          Women1/2 Average Risk     3.4          3.3Average Risk          5.0          4.42X Average Risk          9.6  7.13X Average Risk          15.0          11.0                      . NonHDL 10/29/2019 84.90   Final   NOTE:  Non-HDL goal should be 30 mg/dL higher than patient's LDL goal (i.e. LDL goal of < 70 mg/dL, would have non-HDL goal of < 100 mg/dL)  . Sodium 10/29/2019 141  135 - 145 mEq/L Final  . Potassium 10/29/2019 4.0  3.5 - 5.1 mEq/L Final  . Chloride 10/29/2019 107  96 - 112 mEq/L Final  . CO2 10/29/2019 27  19 - 32 mEq/L Final  . Glucose, Bld 10/29/2019 76  70 - 99 mg/dL Final  . BUN 10/29/2019 28* 6 - 23 mg/dL Final  . Creatinine, Ser 10/29/2019 1.65* 0.40 - 1.50 mg/dL Final  . Total Bilirubin 10/29/2019 0.4  0.2 - 1.2 mg/dL Final  . Alkaline Phosphatase 10/29/2019 67  39 - 117 U/L Final  . AST 10/29/2019 14  0 - 37 U/L Final  . ALT 10/29/2019 26  0 - 53 U/L Final  . Total Protein 10/29/2019 6.2  6.0 - 8.3 g/dL Final  . Albumin 10/29/2019 3.9  3.5 - 5.2 g/dL Final  . GFR 10/29/2019 41.22* >60.00 mL/min Final  . Calcium 10/29/2019 8.6  8.4 - 10.5 mg/dL Final  . Hgb A1c MFr Bld 10/29/2019 6.8* 4.6 - 6.5 % Final   Glycemic Control Guidelines for People with Diabetes:Non Diabetic:  <6%Goal of Therapy: <7%Additional Action Suggested:  >8%     Allergies as of 11/10/2019      Reactions   Viagra [sildenafil Citrate]       Medication List       Accurate as of November 10, 2019  8:49 AM. If you have any questions, ask your nurse or doctor.        amLODipine 5 MG tablet Commonly known as: NORVASC TAKE 1 TABLET BY MOUTH EVERY DAY   aspirin 81 MG tablet Take 81 mg by mouth daily.   atorvastatin 20 MG tablet Commonly known as: LIPITOR TAKE 1 TABLET BY MOUTH EVERY DAY   furosemide 20 MG tablet Commonly known as: LASIX TAKE 1 TABLET BY MOUTH EVERY DAY   glucose blood test strip Commonly known as: Visual merchandiser  Next Test Use as instructed to check blood sugars 4 times per day dx code E10.65   insulin lispro 100 UNIT/ML injection Commonly known as: HumaLOG USE MAX OF 170 UNITS UNDER THE SKIN DAILY VIA INSULIN PUMP.   olmesartan 5 MG tablet Commonly known as: BENICAR TAKE 1 TABLET BY MOUTH EVERY DAY   Ozempic (1 MG/DOSE) 2 MG/1.5ML Sopn Generic drug: Semaglutide (1 MG/DOSE) INJECT 1 MG INTO THE SKIN ONCE A WEEK. INJECT 1MG  UNDER THE SKIN ONCE WEEKLY.   PARoxetine 40 MG tablet Commonly known as: PAXIL TAKE 1 TABLET BY MOUTH EVERYDAY AT BEDTIME   Testosterone 20.25 MG/ACT (1.62%) Gel PLACE 3 PUMP ONTO THE SKIN DAILY.   Vitamin D 50 MCG (2000 UT) Caps Take by mouth. Takes 6000 daily       Allergies:  Allergies  Allergen Reactions  . Viagra [Sildenafil Citrate]     Past Medical History:  Diagnosis Date  . Carpal tunnel syndrome   . Depression   . Diabetes mellitus without complication ([place])   . ED (erectile dysfunction)   . Edema   . Hyperlipidemia   .  Hypertension   . Lumbar disc disease   . Neuropathy   . Stroke [facility])     Past Surgical History:  Procedure Laterality Date  . BACK SURGERY    . VASECTOMY      Family History  Problem Relation Age of Onset  . Cancer Father        Prostate  . Diabetes Neg Hx     Social History:  reports that he has never smoked. He has never used smokeless tobacco. He reports that he does not drink alcohol and does not use drugs.  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   Had been on AndroGel 1.62% pump instead of the packets  He is using 3 pumps daily consistently now, previously had higher level with 4 pumps He says that he is not having any significant energy level lately  Testosterone level as follows:   Lab Results  Component Value Date   TESTOSTERONE 311.55 10/29/2019     HYPERTENSION: Controlled with  Benicar 5 mg along with  5 mg amlodipine  Also has some edema of ankles at times, rarely needing Lasix  BP Readings from Last 3 Encounters:  11/10/19 (!) 130/52  08/07/19 (!) 130/50  05/01/19 130/68      His creatinine is upper normal with recent increase Previously slightly higher with Invokana No microalbuminuria His PCP has done kidney ultrasound and this was normal Has not used any Lasix recently   Lab Results  Component Value Date   CREATININE 1.65 (H) 10/29/2019   CREATININE 1.41 08/04/2019   CREATININE 1.36 04/28/2019     Hypercholesterolemia: Has been controlled on Lipitor 20 mg ``   Lab Results  Component Value Date   CHOL 119 10/29/2019   HDL 34.10 (L) 10/29/2019   LDLCALC 50 10/29/2019   LDLDIRECT 67.0 08/02/2015   TRIG 173.0 (H) 10/29/2019   CHOLHDL 3 10/29/2019    Has history of retinopathy, has regular follow-up with specialist    EXAM:   BP (!) 130/52 (BP Location: Left Arm, Cuff Size: Large)   Pulse 70   Ht 5\' 7"  (1.702 m)   Wt 282 lb (127.9 kg)   SpO2 97%   BMI 44.17 kg/m      ASSESSMENT/PLAN:  DIABETES Type I and obesity:  See history of present illness for detailed discussion on current blood sugar patterns and problems identified  His A1c is 6.8 vs 7%  He is doing well with the help of the CGM and blood sugars are relatively stable except for occasional rise postprandially and occasional low normal sugars with mild transient hypoglycemia as above Ozempic appears to help his postprandial blood sugars and also cut down his desire to  overeat in the evenings Still not motivated to exercise  Recommendations: More consistent bolusing for snacks late at night Avoid over bolusing and try to use 1: 4 carbohydrate ratio especially at dinnertime Reduce correction factor to 1: 25 between 10 PM-7 AM Bolus  10 minutes before eating unless not sure what he is going to eat Check to see if his pump is eligible for upgrade and may consider T-insulin  He needs regular exercise Basal rate to be increased at 10 PM up to 2.9  HYPOGONADISM: He is complaining of decreased energy level His blood testosterone level is in the low 300 range with 3 pumps a day and he can try going up to 4 pumps    HYPERTENSION: Blood pressure is well controlled   Renal dysfunction: Unclear of the etiology, not from diabetes Since creatinine is relatively high recently without any use of Lasix will hold off on Benicar, he is getting labs done in 2 weeks with PCP  LIPIDS: Recheck on next visit   There are no Patient Instructions on file for this visit.     Elayne Snare 11/10/2019

## 2019-12-29 ENCOUNTER — Other Ambulatory Visit: Payer: Self-pay | Admitting: Endocrinology

## 2020-01-03 ENCOUNTER — Other Ambulatory Visit: Payer: Self-pay | Admitting: Endocrinology

## 2020-01-08 ENCOUNTER — Other Ambulatory Visit: Payer: Self-pay | Admitting: Endocrinology

## 2020-01-17 ENCOUNTER — Other Ambulatory Visit: Payer: Self-pay | Admitting: Endocrinology

## 2020-02-05 ENCOUNTER — Other Ambulatory Visit: Payer: Self-pay

## 2020-02-05 ENCOUNTER — Encounter (INDEPENDENT_AMBULATORY_CARE_PROVIDER_SITE_OTHER): Payer: Medicare PPO | Admitting: Ophthalmology

## 2020-02-05 DIAGNOSIS — E11319 Type 2 diabetes mellitus with unspecified diabetic retinopathy without macular edema: Secondary | ICD-10-CM | POA: Diagnosis not present

## 2020-02-05 DIAGNOSIS — D3132 Benign neoplasm of left choroid: Secondary | ICD-10-CM

## 2020-02-05 DIAGNOSIS — E113592 Type 2 diabetes mellitus with proliferative diabetic retinopathy without macular edema, left eye: Secondary | ICD-10-CM | POA: Diagnosis not present

## 2020-02-05 DIAGNOSIS — E113391 Type 2 diabetes mellitus with moderate nonproliferative diabetic retinopathy without macular edema, right eye: Secondary | ICD-10-CM

## 2020-02-05 DIAGNOSIS — I10 Essential (primary) hypertension: Secondary | ICD-10-CM

## 2020-02-05 DIAGNOSIS — H43813 Vitreous degeneration, bilateral: Secondary | ICD-10-CM

## 2020-02-05 DIAGNOSIS — H35033 Hypertensive retinopathy, bilateral: Secondary | ICD-10-CM

## 2020-03-08 ENCOUNTER — Other Ambulatory Visit (INDEPENDENT_AMBULATORY_CARE_PROVIDER_SITE_OTHER): Payer: Medicare PPO

## 2020-03-08 ENCOUNTER — Other Ambulatory Visit: Payer: Self-pay

## 2020-03-08 DIAGNOSIS — E291 Testicular hypofunction: Secondary | ICD-10-CM | POA: Diagnosis not present

## 2020-03-08 DIAGNOSIS — E1065 Type 1 diabetes mellitus with hyperglycemia: Secondary | ICD-10-CM

## 2020-03-08 LAB — BASIC METABOLIC PANEL
BUN: 29 mg/dL — ABNORMAL HIGH (ref 6–23)
CO2: 28 mEq/L (ref 19–32)
Calcium: 8.7 mg/dL (ref 8.4–10.5)
Chloride: 104 mEq/L (ref 96–112)
Creatinine, Ser: 1.38 mg/dL (ref 0.40–1.50)
GFR: 51.18 mL/min — ABNORMAL LOW (ref >60.00)
Glucose, Bld: 142 mg/dL — ABNORMAL HIGH (ref 70–99)
Potassium: 4 mEq/L (ref 3.5–5.1)
Sodium: 138 mEq/L (ref 135–145)

## 2020-03-08 LAB — CBC
HCT: 41.4 % (ref 39.0–52.0)
Hemoglobin: 13.8 g/dL (ref 13.0–17.0)
MCHC: 33.3 g/dL (ref 30.0–36.0)
MCV: 87.4 fl (ref 78.0–100.0)
Platelets: 202 10*3/uL (ref 150.0–400.0)
RBC: 4.74 Mil/uL (ref 4.22–5.81)
RDW: 14.2 % (ref 11.5–15.5)
WBC: 5.4 10*3/uL (ref 4.0–10.5)

## 2020-03-08 LAB — TESTOSTERONE: Testosterone: 394.33 ng/dL (ref 300.00–890.00)

## 2020-03-08 LAB — HEMOGLOBIN A1C: Hgb A1c MFr Bld: 7 % — ABNORMAL HIGH (ref 4.6–6.5)

## 2020-03-12 ENCOUNTER — Encounter: Payer: Self-pay | Admitting: Endocrinology

## 2020-03-15 ENCOUNTER — Other Ambulatory Visit: Payer: Self-pay

## 2020-03-15 ENCOUNTER — Ambulatory Visit: Payer: Medicare PPO | Admitting: Endocrinology

## 2020-03-15 ENCOUNTER — Telehealth (INDEPENDENT_AMBULATORY_CARE_PROVIDER_SITE_OTHER): Payer: Medicare PPO | Admitting: Endocrinology

## 2020-03-15 ENCOUNTER — Encounter: Payer: Self-pay | Admitting: Endocrinology

## 2020-03-15 VITALS — BP 167/67 | HR 74 | Ht 67.0 in | Wt 282.0 lb

## 2020-03-15 DIAGNOSIS — I10 Essential (primary) hypertension: Secondary | ICD-10-CM

## 2020-03-15 DIAGNOSIS — R400 Somnolence: Secondary | ICD-10-CM | POA: Diagnosis not present

## 2020-03-15 DIAGNOSIS — E291 Testicular hypofunction: Secondary | ICD-10-CM | POA: Diagnosis not present

## 2020-03-15 DIAGNOSIS — E1065 Type 1 diabetes mellitus with hyperglycemia: Secondary | ICD-10-CM

## 2020-03-15 NOTE — Progress Notes (Signed)
Patient ID: Paul Adkins, male   DOB: September 26, 1945, 75 y.o.   MRN: [id number]   I connected with the above-named patient by video enabled telemedicine application and verified that I am speaking with the correct person. The patient was explained the limitations of evaluation and management by telemedicine and the availability of in person appointments.  Patient also understood that there may be a patient responsible charge related to this service . Location of the patient: Patient's home . Location of the provider: Physician office Only the patient and myself were participating in the encounter The patient understood the above statements and agreed to proceed.   Reason for visit:   follow-up  Diagnosis: Type 1 diabetes, date of onset 31978  PAST history: He has had persistently poorly controlled diabetes for several years. A1c has been high with usual range 8.2 -9, overall improved since 2013. Compliance with glucose monitoring and diet has been variable and he does better when he is checking more sugars. His A1c was better than usual in 3/14 at 7.5 but he was also having significant hypoglycemia overnight and also occasionally later in the day. At that time he was started on Invokana 300 mg daily which helped him with glucose control and mild weight loss  CURRENT insulin pump brand:  Medtronic 630  PUMP SETTINGS are:   Basal rates: 2.5 from midnight- 4 AM. 4 AM = 2.9.  8 AM = 2.75 10 PM = 2.9.   Boluses : 1 unit for 4 g carbs at mealtimes  Glucose target 110-120, sensitivity 1: 20 with active insulin 2hours.  Changing infusion set every 3 days  Non-insulin HYPOGLYCEMIC drugs: Ozempic irregularly  RECENT history:   His A1c is about the same at 7%, previously was at 6.9 Lowest level 6.8  Current management and problems:  He was not able to upload his pump for today's visit  Data obtained from his Dexcom CGM and the following patterns were identified from  analysis    Has had somewhat variable blood sugars with generally high readings in the first week of the analysis but better in the second week  HYPERGLYCEMIA is primarily occurring after about 6 PM and continuing variably through the night also  Blood sugars are on an average above the target range about half the time between 10 PM and 4 AM  Highest average blood sugar is 178 between 11 PM-1 AM with a maximum of 322  Otherwise blood sugars are generally excellent between about 8 AM-4 PM without any hypoglycemia  LOWEST blood sugars on an average are between 1-2 PM and 126  He does not think he has hypoglycemia subjectively also  POSTPRANDIAL readings are fairly level before and after breakfast and lunch  After dinner postprandial readings are much higher in the first week but better on the second week without significant rise  Statistics as below   He says he is not benefiting from Ozempic and even when he did not take it for 3 weeks he did not see any change in his satiety or blood sugar control  He is thinks his weight is about the same  Most of his hyperglycemia is related to poor eating in the evenings  He still not motivated to exercise   CGM use % of time  99  2-week average/GV  153+/-50  Time in range     73   %  % Time Above 180  27  % Time above 250 4  % Time  Below 70  0.5     PRE-MEAL Fasting Lunch Dinner Bedtime Overall  Glucose range:       Averages:  136  129  150  173    Previous data:    CGM use % of time   Average and SD  135, +/-48  Time in range      83%  % Time Above 180  13  % Time above 250 2  % Time Below target 1     Glycemic patterns summary: Overall blood sugars are fairly level and stable and averaging mostly between 115-130 during the day but starting to rise around 6 PM with the highest average around 10-11 PM.  More variability occurring after about 7 PM also    Wt Readings from Last 3 Encounters:  03/15/20 282 lb (127.9 kg)   11/10/19 282 lb (127.9 kg)  08/07/19 278 lb 6.4 oz (126.3 kg)    Lab Results  Component Value Date   HGBA1C 7.0 (H) 03/08/2020   HGBA1C 6.8 (H) 10/29/2019   HGBA1C 6.9 (H) 08/04/2019   Lab Results  Component Value Date   MICROALBUR 1.0 08/04/2019   LDLCALC 50 10/29/2019   CREATININE 1.38 03/08/2020    OTHER problems addressed today are in review of systems    No visits with results within 1 Week(s) from this visit.  Latest known visit with results is:  Lab on 03/08/2020  Component Date Value Ref Range Status  . WBC 03/08/2020 5.4  4.0 - 10.5 K/uL Final  . RBC 03/08/2020 4.74  4.22 - 5.81 Mil/uL Final  . Platelets 03/08/2020 202.0  150.0 - 400.0 K/uL Final  . Hemoglobin 03/08/2020 13.8  13.0 - 17.0 g/dL Final  . HCT 03/08/2020 41.4  39.0 - 52.0 % Final  . MCV 03/08/2020 87.4  78.0 - 100.0 fl Final  . MCHC 03/08/2020 33.3  30.0 - 36.0 g/dL Final  . RDW 03/08/2020 14.2  11.5 - 15.5 % Final  . Testosterone 03/08/2020 394.33  300.00 - 890.00 ng/dL Final  . Sodium 03/08/2020 138  135 - 145 mEq/L Final  . Potassium 03/08/2020 4.0  3.5 - 5.1 mEq/L Final  . Chloride 03/08/2020 104  96 - 112 mEq/L Final  . CO2 03/08/2020 28  19 - 32 mEq/L Final  . Glucose, Bld 03/08/2020 142* 70 - 99 mg/dL Final  . BUN 03/08/2020 29* 6 - 23 mg/dL Final  . Creatinine, Ser 03/08/2020 1.38  0.40 - 1.50 mg/dL Final  . GFR 03/08/2020 51.18* >60.00 mL/min Final   Calculated using the CKD-EPI Creatinine Equation (2021)  . Calcium 03/08/2020 8.7  8.4 - 10.5 mg/dL Final  . Hgb A1c MFr Bld 03/08/2020 7.0* 4.6 - 6.5 % Final   Glycemic Control Guidelines for People with Diabetes:Non Diabetic:  <6%Goal of Therapy: <7%Additional Action Suggested:  >8%     Allergies as of 03/15/2020      Reactions   Viagra [sildenafil Citrate]       Medication List       Accurate as of March 15, 2020 12:43 PM. If you have any questions, ask your nurse or doctor.        STOP taking these medications   olmesartan  5 MG tablet Commonly known as: BENICAR Stopped by: Elayne Snare, MD     TAKE these medications   amLODipine 5 MG tablet Commonly known as: NORVASC TAKE 1 TABLET BY MOUTH EVERY DAY   aspirin 81 MG tablet Take 81 mg by mouth  daily.   atorvastatin 20 MG tablet Commonly known as: LIPITOR TAKE 1 TABLET BY MOUTH EVERY DAY   furosemide 20 MG tablet Commonly known as: LASIX TAKE 1 TABLET BY MOUTH EVERY DAY   glucose blood test strip Commonly known as: Visual merchandiser Next Test Use as instructed to check blood sugars 4 times per day dx code E10.65   insulin lispro 100 UNIT/ML injection Commonly known as: HumaLOG USE MAX OF 170 UNITS UNDER THE SKIN DAILY VIA INSULIN PUMP.   Ozempic (1 MG/DOSE) 4 MG/3ML Sopn Generic drug: Semaglutide (1 MG/DOSE) INJECT 1 MG INTO THE SKIN ONCE A WEEK. INJECT 1MG  UNDER THE SKIN ONCE WEEKLY.   PARoxetine 40 MG tablet Commonly known as: PAXIL TAKE 1 TABLET BY MOUTH EVERYDAY AT BEDTIME   Testosterone 20.25 MG/ACT (1.62%) Gel PLACE 3 PUMP ONTO THE SKIN DAILY.   Vitamin D 50 MCG (2000 UT) Caps Take by mouth. Takes 6000 daily       Allergies:  Allergies  Allergen Reactions  . Viagra [Sildenafil Citrate]     Past Medical History:  Diagnosis Date  . Carpal tunnel syndrome   . Depression   . Diabetes mellitus without complication ([place])   . ED (erectile dysfunction)   . Edema   . Hyperlipidemia   . Hypertension   . Lumbar disc disease   . Neuropathy   . Stroke [facility])     Past Surgical History:  Procedure Laterality Date  . BACK SURGERY    . VASECTOMY      Family History  Problem Relation Age of Onset  . Cancer Father        Prostate  . Diabetes Neg Hx     Social History:  reports that he has never smoked. He has never used smokeless tobacco. He reports that he does not drink alcohol and does not use drugs.  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   Had been on AndroGel 1.62% pump   He is using 5 pumps daily instead of 4 pumps and he increased the dose on his own because he thought his level should be about 450 He does not feel lethargic or tired but only sleepy Previously had been on 3 pumps only  Testosterone level as follows:   Lab Results  Component Value Date   TESTOSTERONE 394.33 03/08/2020     HYPERTENSION: Now treated with 80 mg valsartan along with  5 mg amlodipine His PCP started him on valsartan, he was told to hold off on his Benicar on the last visit because of increased creatinine  He thinks his blood pressure has been mostly about 0000000 average systolic recently  Also has some edema of legs, more on the right  BP Readings from Last 3 Encounters:  03/15/20 (!) 167/67  11/10/19 (!) 130/52  08/07/19 (!) 130/50      His creatinine is upper normal and better now Previously slightly higher with Invokana No microalbuminuria His PCP has done  kidney ultrasound and this was normal    Lab Results  Component Value Date   CREATININE 1.38 03/08/2020   CREATININE 1.65 (H) 10/29/2019   CREATININE 1.41 08/04/2019     Hypercholesterolemia: Has been controlled on Lipitor 20 mg ``   Lab Results  Component Value Date   CHOL 119 10/29/2019   HDL 34.10 (L) 10/29/2019   LDLCALC 50 10/29/2019   LDLDIRECT 67.0 08/02/2015   TRIG 173.0 (H) 10/29/2019   CHOLHDL 3 10/29/2019    Has history of retinopathy, has regular follow-up with specialist  Is complaining of excessive sleepiness  despite no symptoms of depression and not feeling lethargic    EXAM:   BP (!) 167/67   Pulse 74   Ht 5\' 7"  (1.702 m)   Wt 282 lb (127.9 kg)   SpO2 98%   BMI 44.17 kg/m      ASSESSMENT/PLAN:  DIABETES Type I and obesity:  See history of present illness for detailed discussion on current blood sugar patterns from data obtained from download of devices and problems identified  His A1c is 7%  His CGM patterns were discussed in detail Day-to-day management also discussed He is not taking Ozempic recently but he does not think his sugars are higher and he appears to be reasonably good in the last week at least He does not think this helps his portion control However still having difficulty losing weight with or without Ozempic Generally not exercising  Recommendations: He will try to be more consistent with his diet in the evenings Since his blood sugars are probably low normal after breakfast boluses he will change carbohydrate ratio to 1: 5 in the morning This may also reduce low normal sugars at lunchtime He will try to get the new Medtronic pump when eligible in April He wants to look at the T-Slim pump also and discussed the differences, however may do better with the 780 Medtronic pump when available Does need to consistently exercise Hold off on Ozempic but may consider when 2 mg dose available   HYPOGONADISM: He is not having as much fatigue and discussed that considering his age his level of 40 is adequate He is trying to do 5 pumps daily s on his own ince his labs were done but still feels sleepy He needs to go back to doing 4 pumps daily which had improved his level since his last visit  Somnolence: He will discuss with PCP regarding possible sleep apnea Also check TSH on the next visit   HYPERTENSION: Blood pressure is not controlled and not clear why This is also being followed by PCP and has upcoming appointment  He will in the meantime increase Diovan to  80 mg and take furosemide at least every other day if he is not taking any otherwise add 3 extra pills per week   Renal dysfunction: Improved, needs to be followed by PCP also  LIPIDS: Well-controlled   There are no Patient Instructions on file for this visit.     Elayne Snare 03/15/2020

## 2020-03-15 NOTE — Progress Notes (Signed)
  Fasting readings with dates: __ __ ___ ___  __ __   After breakfast or before lunch:__ __ ___ ___  __ __   After lunch/before dinner:__ __ ___ ___  __ __   After dinner/bedtime__ __ ___ ___  __ __   Average for 15 or 30 days:   Today reading --after breakfast--95 Dexcom data is available

## 2020-03-24 ENCOUNTER — Other Ambulatory Visit: Payer: Self-pay

## 2020-03-24 ENCOUNTER — Encounter: Payer: Medicare PPO | Attending: Endocrinology | Admitting: Nutrition

## 2020-03-24 DIAGNOSIS — E1065 Type 1 diabetes mellitus with hyperglycemia: Secondary | ICD-10-CM | POA: Insufficient documentation

## 2020-03-25 NOTE — Progress Notes (Signed)
Patient is here today to discuss the new pumps.  His will be out of warrant in April.  We discussed the differences between the new Medtronic pump that has just been approved and Tandem pump.  Pt. Reports that he is very "leary" about the accuracy of the sensors and the constant alarms from this, that he experienced before going on medicare.  He was told to go on line to review literature on the new Medtronic pump before making his decision.  He agreed to do this.   He had no final questions.

## 2020-03-25 NOTE — Patient Instructions (Signed)
Go onto Medtronic's web site to review the new 800 series pumps

## 2020-04-06 ENCOUNTER — Other Ambulatory Visit: Payer: Self-pay | Admitting: Endocrinology

## 2020-04-06 ENCOUNTER — Telehealth: Payer: Self-pay | Admitting: *Deleted

## 2020-04-26 ENCOUNTER — Other Ambulatory Visit: Payer: Self-pay | Admitting: Endocrinology

## 2020-06-07 ENCOUNTER — Other Ambulatory Visit (INDEPENDENT_AMBULATORY_CARE_PROVIDER_SITE_OTHER): Payer: Medicare PPO

## 2020-06-07 ENCOUNTER — Other Ambulatory Visit: Payer: Self-pay

## 2020-06-07 DIAGNOSIS — E291 Testicular hypofunction: Secondary | ICD-10-CM | POA: Diagnosis not present

## 2020-06-07 DIAGNOSIS — R400 Somnolence: Secondary | ICD-10-CM

## 2020-06-07 DIAGNOSIS — E1065 Type 1 diabetes mellitus with hyperglycemia: Secondary | ICD-10-CM | POA: Diagnosis not present

## 2020-06-07 LAB — TSH: TSH: 4.1 u[IU]/mL (ref 0.35–4.50)

## 2020-06-07 LAB — LIPID PANEL
Cholesterol: 115 mg/dL (ref 0–200)
HDL: 35.8 mg/dL — ABNORMAL LOW (ref >39.00)
LDL Cholesterol: 53 mg/dL (ref 0–99)
NonHDL: 78.7
Total CHOL/HDL Ratio: 3
Triglycerides: 131 mg/dL (ref 0.0–149.0)
VLDL: 26.2 mg/dL (ref 0.0–40.0)

## 2020-06-07 LAB — COMPREHENSIVE METABOLIC PANEL
ALT: 29 U/L (ref 0–53)
AST: 20 U/L (ref 0–37)
Albumin: 3.7 g/dL (ref 3.5–5.2)
Alkaline Phosphatase: 68 U/L (ref 39–117)
BUN: 23 mg/dL (ref 6–23)
CO2: 27 mEq/L (ref 19–32)
Calcium: 8.7 mg/dL (ref 8.4–10.5)
Chloride: 108 mEq/L (ref 96–112)
Creatinine, Ser: 1.3 mg/dL (ref 0.40–1.50)
GFR: 54.89 mL/min — ABNORMAL LOW (ref >60.00)
Glucose, Bld: 82 mg/dL (ref 70–99)
Potassium: 3.9 mEq/L (ref 3.5–5.1)
Sodium: 142 mEq/L (ref 135–145)
Total Bilirubin: 0.4 mg/dL (ref 0.2–1.2)
Total Protein: 6.3 g/dL (ref 6.0–8.3)

## 2020-06-07 LAB — MICROALBUMIN / CREATININE URINE RATIO
Creatinine,U: 135.8 mg/dL
Microalb Creat Ratio: 2.8 mg/g (ref 0.0–30.0)
Microalb, Ur: 3.9 mg/dL — ABNORMAL HIGH (ref 0.0–1.9)

## 2020-06-07 LAB — HEMOGLOBIN A1C: Hgb A1c MFr Bld: 7.4 % — ABNORMAL HIGH (ref 4.6–6.5)

## 2020-06-07 LAB — TESTOSTERONE: Testosterone: 158.62 ng/dL — ABNORMAL LOW (ref 300.00–890.00)

## 2020-06-09 ENCOUNTER — Other Ambulatory Visit: Payer: Self-pay

## 2020-06-09 ENCOUNTER — Telehealth: Payer: Self-pay | Admitting: *Deleted

## 2020-06-09 ENCOUNTER — Other Ambulatory Visit: Payer: Self-pay | Admitting: Endocrinology

## 2020-06-09 ENCOUNTER — Telehealth: Payer: Self-pay | Admitting: Endocrinology

## 2020-06-09 ENCOUNTER — Encounter: Payer: Self-pay | Admitting: Endocrinology

## 2020-06-09 ENCOUNTER — Ambulatory Visit: Payer: Medicare PPO | Admitting: Endocrinology

## 2020-06-09 VITALS — BP 160/82 | HR 74 | Ht 67.0 in | Wt 296.0 lb

## 2020-06-09 DIAGNOSIS — E1065 Type 1 diabetes mellitus with hyperglycemia: Secondary | ICD-10-CM | POA: Diagnosis not present

## 2020-06-09 DIAGNOSIS — R600 Localized edema: Secondary | ICD-10-CM | POA: Diagnosis not present

## 2020-06-09 DIAGNOSIS — I10 Essential (primary) hypertension: Secondary | ICD-10-CM

## 2020-06-09 DIAGNOSIS — R400 Somnolence: Secondary | ICD-10-CM

## 2020-06-09 DIAGNOSIS — E291 Testicular hypofunction: Secondary | ICD-10-CM

## 2020-06-09 MED ORDER — LABETALOL HCL 200 MG PO TABS: 100.0000 mg | ORAL_TABLET | Freq: Two times a day (BID) | ORAL | 3 refills | Status: DC

## 2020-06-09 MED ORDER — METHYLPHENIDATE HCL 10 MG PO TABS: ORAL_TABLET | ORAL | 0 refills | Status: DC

## 2020-06-09 MED ORDER — OLMESARTAN MEDOXOMIL 20 MG PO TABS: 20.0000 mg | ORAL_TABLET | Freq: Every day | ORAL | 3 refills | Status: DC

## 2020-06-09 NOTE — Telephone Encounter (Signed)
Dr. Dwyane Dee escribe the rx.

## 2020-06-09 NOTE — Progress Notes (Signed)
Patient ID: Paul Adkins, male   DOB: 01/12/1946, 75 y.o.   MRN: [id number]   Reason for visit:   follow-up  Diagnosis: Type 1 diabetes, date of onset 1978  PAST history: He has had persistently poorly controlled diabetes for several years. A1c has been high with usual range 8.2 -9, overall improved since 2013. Compliance with glucose monitoring and diet has been variable and he does better when he is checking more sugars. His A1c was better than usual in 3/14 at 7.5 but he was also having significant hypoglycemia overnight and also occasionally later in the day. At that time he was started on Invokana 300 mg daily which helped him with glucose control and mild weight loss  CURRENT insulin pump brand:  Medtronic [id number]  PUMP SETTINGS are:   Basal rates: 2.8 from midnight- 4 AM. 4 AM = 2.75.  8 AM = 2.9.   Boluses : 1 unit for 4 g carbs at mealtimes  Glucose target 110-120, sensitivity 1: 20 except 1: 25 at 10 PM, with active insulin 2 hours.  Changing infusion set every 3 days  Non-insulin HYPOGLYCEMIC drugs: Currently none  RECENT history:   His A1c is gradually increasing and now 7.4  Lowest level 6.8  Current management and problems:   Glycemic data was reviewed from his Dexcom CGM and the following is the interpretation    Blood sugars generally are the highest late at night and lowest in the early afternoons however with more variability late in the evenings  Has tendency to blood sugars gradually rising overall after about 4-5 PM although less in the last week compared to the previous week    Although his postprandial readings are excellent and relatively flat after breakfast and lunch but variable after dinner, generally higher in the first week but more consistently in the last week except later clinic  Hypoglycemia has been infrequent and only once transiently overnight  Overall blood sugars are somewhat variable blood sugars with generally high readings in  the first week of the analysis but better in the second week  POSTPRANDIAL readings are fairly level before and after breakfast and lunch  After dinner postprandial readings are much higher in the first week but better on the second week without significant rise  Also may have high blood sugars around midnight and after apparently  Statistics as below, most of his data is similar to the previous visit   He was previously on Ozempic which was stopped but he appears to have gained weight and has higher A1c  He will sometimes take insulin with a syringe instead of bolusing especially when the blood sugars are significantly high as he thinks this works better  He feels that he can benefit from consultation with a dietitian to help with portion control and snacks  Otherwise usually trying to bolus before starting to eat  His basal rates are fairly similar most of the day and night  He has done only occasional walking   CGM use % of time  99  2-week average/GV  153+/- 47  Time in range     76%  % Time Above 180  24  % Time above 250 4  % Time Below 70  0.3     PRE-MEAL Fasting Lunch Dinner Bedtime Overall  Glucose range:       Averages:  136  129  150  173     Glycemic patterns summary: Overall blood sugars are fairly level and  stable and averaging mostly between 115-130 during the day but starting to rise around 6 PM with the highest average around 10-11 PM.  More variability occurring after about 7 PM also    Wt Readings from Last 3 Encounters:  06/09/20 296 lb (134.3 kg)  03/15/20 282 lb (127.9 kg)  11/10/19 282 lb (127.9 kg)    Lab Results  Component Value Date   HGBA1C 7.4 (H) 06/07/2020   HGBA1C 7.0 (H) 03/08/2020   HGBA1C 6.8 (H) 10/29/2019   Lab Results  Component Value Date   MICROALBUR 3.9 (H) 06/07/2020   LDLCALC 53 06/07/2020   CREATININE 1.30 06/07/2020    OTHER problems addressed today are in review of systems    Lab on 06/07/2020  Component  Date Value Ref Range Status  . Testosterone 06/07/2020 158.62* 300.00 - 890.00 ng/dL Final  . Microalb, Ur 06/07/2020 3.9* 0.0 - 1.9 mg/dL Final  . Creatinine,U 06/07/2020 135.8  mg/dL Final  . Microalb Creat Ratio 06/07/2020 2.8  0.0 - 30.0 mg/g Final  . TSH 06/07/2020 4.10  0.35 - 4.50 uIU/mL Final  . Cholesterol 06/07/2020 115  0 - 200 mg/dL Final   ATP III Classification       Desirable:  < 200 mg/dL               Borderline High:  200 - 239 mg/dL          High:  > = 240 mg/dL  . Triglycerides 06/07/2020 131.0  0.0 - 149.0 mg/dL Final   Normal:  <150 mg/dLBorderline High:  150 - 199 mg/dL  . HDL 06/07/2020 35.80* >39.00 mg/dL Final  . VLDL 06/07/2020 26.2  0.0 - 40.0 mg/dL Final  . LDL Cholesterol 06/07/2020 53  0 - 99 mg/dL Final  . Total CHOL/HDL Ratio 06/07/2020 3   Final                  Men          Women1/2 Average Risk     3.4          3.3Average Risk          5.0          4.42X Average Risk          9.6          7.13X Average Risk          15.0          11.0                      . NonHDL 06/07/2020 78.70   Final   NOTE:  Non-HDL goal should be 30 mg/dL higher than patient's LDL goal (i.e. LDL goal of < 70 mg/dL, would have non-HDL goal of < 100 mg/dL)  . Sodium 06/07/2020 142  135 - 145 mEq/L Final  . Potassium 06/07/2020 3.9  3.5 - 5.1 mEq/L Final  . Chloride 06/07/2020 108  96 - 112 mEq/L Final  . CO2 06/07/2020 27  19 - 32 mEq/L Final  . Glucose, Bld 06/07/2020 82  70 - 99 mg/dL Final  . BUN 06/07/2020 23  6 - 23 mg/dL Final  . Creatinine, Ser 06/07/2020 1.30  0.40 - 1.50 mg/dL Final  . Total Bilirubin 06/07/2020 0.4  0.2 - 1.2 mg/dL Final  . Alkaline Phosphatase 06/07/2020 68  39 - 117 U/L Final  . AST 06/07/2020 20  0 - 37 U/L Final  .  ALT 06/07/2020 29  0 - 53 U/L Final  . Total Protein 06/07/2020 6.3  6.0 - 8.3 g/dL Final  . Albumin 06/07/2020 3.7  3.5 - 5.2 g/dL Final  . GFR 06/07/2020 54.89* >60.00 mL/min Final   Calculated using the CKD-EPI Creatinine Equation  (2021)  . Calcium 06/07/2020 8.7  8.4 - 10.5 mg/dL Final  . Hgb A1c MFr Bld 06/07/2020 7.4* 4.6 - 6.5 % Final   Glycemic Control Guidelines for People with Diabetes:Non Diabetic:  <6%Goal of Therapy: <7%Additional Action Suggested:  >8%     Allergies as of 06/09/2020      Reactions   Viagra [sildenafil Citrate]       Medication List       Accurate as of June 09, 2020 10:17 AM. If you have any questions, ask your nurse or doctor.        amLODipine 5 MG tablet Commonly known as: NORVASC TAKE 1 TABLET BY MOUTH EVERY DAY   aspirin 81 MG tablet Take 81 mg by mouth daily.   atorvastatin 20 MG tablet Commonly known as: LIPITOR TAKE 1 TABLET BY MOUTH EVERY DAY   furosemide 20 MG tablet Commonly known as: LASIX TAKE 1 TABLET BY MOUTH EVERY DAY   glucose blood test strip Commonly known as: Visual merchandiser Next Test Use as instructed to check blood sugars 4 times per day dx code E10.65   insulin lispro 100 UNIT/ML injection Commonly known as: HumaLOG USE MAX OF 170 UNITS UNDER THE SKIN DAILY VIA INSULIN PUMP.   labetalol 200 MG tablet Commonly known as: NORMODYNE Take 0.5 tablets (100 mg total) by mouth 2 (two) times daily. Started by: Elayne Snare, MD   olmesartan 20 MG tablet Commonly known as: Benicar Take 1 tablet (20 mg total) by mouth daily. Started by: Elayne Snare, MD   Ozempic (1 MG/DOSE) 4 MG/3ML Sopn Generic drug: Semaglutide (1 MG/DOSE) INJECT 1 MG INTO THE SKIN ONCE A WEEK. INJECT 1MG  UNDER THE SKIN ONCE WEEKLY.   PARoxetine 40 MG tablet Commonly known as: PAXIL TAKE 1 TABLET BY MOUTH EVERYDAY AT BEDTIME   Testosterone 20.25 MG/ACT (1.62%) Gel Apply 2 pumps on each upper arm daily after shower   Vitamin D 50 MCG (2000 UT) Caps Take by mouth. Takes 6000 daily       Allergies:  Allergies  Allergen Reactions  . Viagra [Sildenafil Citrate]     Past Medical History:  Diagnosis Date  . Carpal tunnel syndrome   . Depression   . Diabetes mellitus  without complication ([place])   . ED (erectile dysfunction)   . Edema   . Hyperlipidemia   . Hypertension   . Lumbar disc disease   . Neuropathy   . Stroke [facility])     Past Surgical History:  Procedure Laterality Date  . BACK SURGERY    . VASECTOMY      Family History  Problem Relation Age of Onset  . Cancer Father        Prostate  . Diabetes Neg Hx     Social History:  reports that he has never smoked. He has never used smokeless tobacco. He reports that he does not drink alcohol and does not use drugs.  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   Had been on AndroGel 1.62% pump   He is using 5 pumps daily but applying this on one side He missed the doses 2 days prior to his lab work done except the day before his labs  Testosterone level as follows:   Lab Results  Component Value Date   TESTOSTERONE 158.62 (L) 06/07/2020     HYPERTENSION: Now treated with 160 mg valsartan along with  5 mg amlodipine His PCP continued him on valsartan, no recent changes made  He thinks his blood pressure has been mostly about 213 systolic  He now says that he has significant edema of his legs bilaterally  BP Readings from Last 3 Encounters:  06/09/20 (!) 160/82  03/15/20 (!) 167/67  11/10/19 (!) 130/52      His creatinine is improved Previously slightly higher with Invokana No microalbuminuria His PCP has checked kidney ultrasound and this was normal    Lab  Results  Component Value Date   CREATININE 1.30 06/07/2020   CREATININE 1.38 03/08/2020   CREATININE 1.65 (H) 10/29/2019     Hypercholesterolemia: Has been controlled on Lipitor 20 mg, still has low HDL``   Lab Results  Component Value Date   CHOL 115 06/07/2020   HDL 35.80 (L) 06/07/2020   LDLCALC 53 06/07/2020   LDLDIRECT 67.0 08/02/2015   TRIG 131.0 06/07/2020   CHOLHDL 3 06/07/2020    Has history of retinopathy, has regular follow-up with specialist  Is complaining of excessive sleepiness despite no symptoms of depression TSH normal Has not followed up with his sleep specialist for his OSA    EXAM:   BP (!) 160/82   Pulse 74   Ht 5\' 7"  (1.702 m)   Wt 296 lb (134.3 kg)   SpO2 97%   BMI 46.36 kg/m   2+ lower leg edema present   ASSESSMENT/PLAN:  DIABETES Type I and obesity:  See history of present illness for detailed discussion on current blood sugar patterns from data obtained from download of devices and problems identified  His A1c is 7.4  He has increasing blood sugars late in the evenings likely from inconsistent diet Although he has gained weight this may be partly from his edema However likely was benefiting somewhat from [place] which was stopped on his last visit He can do better with exercise  He will benefit with consultation with the dietitian  Since his blood sugars are probably low normal after breakfast boluses he will change carbohydrate ratio to 1: 5 in the morning This may also reduce low normal sugars at lunchtime He will try to get the new Medtronic pump when eligible in April He wants to switch to the the T-Slim pump also and discussed the differences, however he prefers to not continue Medtronic because of using the Dexcom Restart OZEMPIC using half the dose for the first 2 weeks and then 1 mg To use 2 mg weekly when this is available at the pharmacy Basal rate settings: Basal rate settings midnight = 3.0, 3 AM = 2.8, 8 AM = 2.9  and 10 PM = 3.1  HYPOGONADISM: He is having a low level probably from missing a couple of doses prior to his  labs Also recommended that he apply 2 pumps on 1 side and 3 on the other shoulder instead of doing total dose on 1 side  Somnolence: He will discuss management with his sleep disorder specialist whom he has not seen for a while In the meantime can try Ritalin again which he has taken before, he can take this every other day if causing any anxiety   HYPERTENSION: Blood pressure is not controlled and more difficult to control Also associated with edema not related to renal dysfunction or proteinuria  He feels that his blood pressure was better with Benicar compared to valsartan and will switch him to 20 mg daily Also switch amlodipine to labetalol 200 mg twice daily which will reduce edema Take Lasix 20 mg daily until edema is significantly better and then take as needed  Since he has an upcoming visit with PCP later this month he can follow-up  Renal dysfunction: Improved, needs to be followed by PCP on upcoming visit  LIPIDS: Well-controlled except for low HDL   Patient Instructions  Stop amlodipine  Labetalol 1/2 2x daily for  3 days then 1 tab 2x daily  Dial 38 clicks Ozempic for 1st 2 doses then 1mg  weekly  Testo gel use 3 pumps on 1 side and 2 on other  Lasix daily till swelling down.   Call Dr Halford Chessman    Total duration of visit including management of multiple problems and counseling =45 minutes   Elayne Snare 06/09/2020

## 2020-06-09 NOTE — Patient Instructions (Addendum)
Stop amlodipine  Labetalol 1/2 2x daily for  3 days then 1 tab 2x daily  Dial 38 clicks Ozempic for 1st 2 doses then 1mg  weekly  Testo gel use 3 pumps on 1 side and 2 on other  Lasix daily till swelling down.   Call Dr Halford Chessman

## 2020-06-09 NOTE — Telephone Encounter (Signed)
[facility] called to let us know they cant except the prescription that was sent in for the generic of ritalin, it must be escribed.

## 2020-06-09 NOTE — Telephone Encounter (Signed)
Notified pt Rx ritalin is ready to be pick-up front office.

## 2020-07-01 ENCOUNTER — Other Ambulatory Visit: Payer: Self-pay | Admitting: Endocrinology

## 2020-07-22 ENCOUNTER — Other Ambulatory Visit: Payer: Self-pay | Admitting: Endocrinology

## 2020-08-04 ENCOUNTER — Encounter: Payer: Medicare PPO | Admitting: Nutrition

## 2020-08-05 ENCOUNTER — Ambulatory Visit: Payer: Medicare PPO | Admitting: Dietician

## 2020-08-05 ENCOUNTER — Encounter (INDEPENDENT_AMBULATORY_CARE_PROVIDER_SITE_OTHER): Payer: Medicare PPO | Admitting: Ophthalmology

## 2020-08-10 ENCOUNTER — Other Ambulatory Visit: Payer: Self-pay | Admitting: Endocrinology

## 2020-08-18 ENCOUNTER — Encounter: Payer: Medicare PPO | Attending: Endocrinology | Admitting: Nutrition

## 2020-08-18 ENCOUNTER — Other Ambulatory Visit: Payer: Self-pay

## 2020-08-18 DIAGNOSIS — E1065 Type 1 diabetes mellitus with hyperglycemia: Secondary | ICD-10-CM | POA: Insufficient documentation

## 2020-08-19 ENCOUNTER — Telehealth: Payer: Self-pay | Admitting: Nutrition

## 2020-08-19 NOTE — Telephone Encounter (Signed)
Patient reported no difficulty in giving bolus for supper tonight.  Says blood sugar 1 hour pcS was 198.  He had no questions for  me a this time.

## 2020-08-19 NOTE — Progress Notes (Signed)
Patient was trained on how to use the Tandem pump.  Settings were transferred from his Medtronic pump to the Tandem pump:  Basal rate:  MN: Basal rates: 2.8 from midnight- 4 AM. 4 AM = 2.75.  8 AM = 2.9. I/C: 4 g ISF: 20 10PM: 25 Glucose target 110-120 He was put in control IQ and linked to [place] endo.  We reviewed how to bolus, do a correction dose, how to the control IQ works, start/stop the pump and changing a cartridge, making changes to pump settings.  He filled a cartridge with Novolog and attached a needle infusion set.  He gave himself a correction dose of 6.1 units at 3PM due to high blood sugar.  IOB was calculated and subtracted from his old pump.  We also put in a sleep setting from 10PM to [place] given on all of the above topics.  He had no final questions.  He was told to make an appointment with Dr. Dwyane Dee in 2 weeks.  He agreed to do this.

## 2020-08-19 NOTE — Patient Instructions (Signed)
Read over handouts given and pump manual Call Tandem help line if questions.

## 2020-08-25 ENCOUNTER — Encounter (INDEPENDENT_AMBULATORY_CARE_PROVIDER_SITE_OTHER): Payer: Medicare PPO | Admitting: Ophthalmology

## 2020-08-25 ENCOUNTER — Other Ambulatory Visit: Payer: Self-pay

## 2020-08-25 DIAGNOSIS — E113592 Type 2 diabetes mellitus with proliferative diabetic retinopathy without macular edema, left eye: Secondary | ICD-10-CM | POA: Diagnosis not present

## 2020-08-25 DIAGNOSIS — H35033 Hypertensive retinopathy, bilateral: Secondary | ICD-10-CM | POA: Diagnosis not present

## 2020-08-25 DIAGNOSIS — I10 Essential (primary) hypertension: Secondary | ICD-10-CM | POA: Diagnosis not present

## 2020-08-25 DIAGNOSIS — H43813 Vitreous degeneration, bilateral: Secondary | ICD-10-CM

## 2020-08-25 DIAGNOSIS — D3132 Benign neoplasm of left choroid: Secondary | ICD-10-CM

## 2020-08-25 DIAGNOSIS — E113391 Type 2 diabetes mellitus with moderate nonproliferative diabetic retinopathy without macular edema, right eye: Secondary | ICD-10-CM

## 2020-09-06 ENCOUNTER — Other Ambulatory Visit (INDEPENDENT_AMBULATORY_CARE_PROVIDER_SITE_OTHER): Payer: Medicare PPO

## 2020-09-06 ENCOUNTER — Other Ambulatory Visit: Payer: Self-pay

## 2020-09-06 DIAGNOSIS — E1065 Type 1 diabetes mellitus with hyperglycemia: Secondary | ICD-10-CM

## 2020-09-06 DIAGNOSIS — E291 Testicular hypofunction: Secondary | ICD-10-CM | POA: Diagnosis not present

## 2020-09-06 LAB — BASIC METABOLIC PANEL
BUN: 23 mg/dL (ref 6–23)
CO2: 27 mEq/L (ref 19–32)
Calcium: 8.5 mg/dL (ref 8.4–10.5)
Chloride: 105 mEq/L (ref 96–112)
Creatinine, Ser: 1.47 mg/dL (ref 0.40–1.50)
GFR: 47.28 mL/min — ABNORMAL LOW (ref >60.00)
Glucose, Bld: 138 mg/dL — ABNORMAL HIGH (ref 70–99)
Potassium: 4 mEq/L (ref 3.5–5.1)
Sodium: 141 mEq/L (ref 135–145)

## 2020-09-06 LAB — TESTOSTERONE: Testosterone: 317.07 ng/dL (ref 300.00–890.00)

## 2020-09-06 LAB — HEMOGLOBIN A1C: Hgb A1c MFr Bld: 7.8 % — ABNORMAL HIGH (ref 4.6–6.5)

## 2020-09-09 ENCOUNTER — Ambulatory Visit: Payer: Medicare PPO | Admitting: Endocrinology

## 2020-09-09 ENCOUNTER — Other Ambulatory Visit: Payer: Self-pay

## 2020-09-09 ENCOUNTER — Encounter: Payer: Self-pay | Admitting: Endocrinology

## 2020-09-09 VITALS — BP 155/60 | HR 72 | Ht 67.0 in | Wt 296.4 lb

## 2020-09-09 DIAGNOSIS — I10 Essential (primary) hypertension: Secondary | ICD-10-CM

## 2020-09-09 DIAGNOSIS — H8110 Benign paroxysmal vertigo, unspecified ear: Secondary | ICD-10-CM | POA: Diagnosis not present

## 2020-09-09 DIAGNOSIS — N1831 Chronic kidney disease, stage 3a: Secondary | ICD-10-CM

## 2020-09-09 DIAGNOSIS — E1065 Type 1 diabetes mellitus with hyperglycemia: Secondary | ICD-10-CM | POA: Diagnosis not present

## 2020-09-09 MED ORDER — ISRADIPINE 5 MG PO CAPS: 5.0000 mg | ORAL_CAPSULE | Freq: Two times a day (BID) | ORAL | 2 refills | Status: DC

## 2020-09-09 NOTE — Progress Notes (Signed)
Patient ID: Paul Adkins, male   DOB: Apr 03, 1945, 75 y.o.   MRN: [id number]   Reason for visit:   follow-up  Diagnosis: Type 1 diabetes, date of onset 1978  PAST history: He has had persistently poorly controlled diabetes for several years. A1c has been high with usual range 8.2 -9, overall improved since 2013. Compliance with glucose monitoring and diet has been variable and he does better when he is checking more sugars. His A1c was better than usual in 3/14 at 7.5 but he was also having significant hypoglycemia overnight and also occasionally later in the day. At that time he was started on Invokana 300 mg daily which helped him with glucose control and mild weight loss  CURRENT insulin pump brand: T-insulin  PUMP SETTINGS are:   Basal rates: 2.8 from midnight- 4 AM. 4 AM = 2.75.  8 AM = 2.9.   Boluses : 1 unit for 4 g carbs at mealtimes  Glucose target 110-120, sensitivity 1: 20 except 1: 25 at 10 a.m., with active insulin 5 hours.  Changing infusion set every 3 days  Non-insulin HYPOGLYCEMIC drugs: Ozempic 1 mg weekly  RECENT history:   His A1c is gradually increasing and now 7.8 Lowest level 6.8  Current glycemic patterns, management and problems:  Glycemic patterns were reviewed from his pump download  Interpretation of the CGM as follows: Blood sugars show marked fluctuation in the last 2 weeks and overnight, between 6-10 PM mostly. Highest blood sugars overall are after 8 PM until about 2 AM Blood sugars are mostly rising in the last 3 to 4 days in the late evening Blood sugars are otherwise very stable and fairly well controlled in the mornings and early afternoons Has tendency to slightly low blood sugars around midday or around 7-8 PM but usually no significant hypoglycemia OVERNIGHT blood sugars are quite variable but generally decreasing between midnight-6 AM  He is generally bolusing somewhat arbitrarily based on what he thinks should be the coverage for  various meals especially in the evening With this he is only rarely entering carbohydrates for his meals BOLUS insulin is 50 units compared to 72 units for BASAL insulin Is frequently overriding the pump Recently his blood sugars appear to be generally rising in the late evening blood sugars are somewhat variable blood sugars with generally high readings in the first week of the analysis but better in the second week POSTPRANDIAL readings are periodically significantly higher and persistently rising especially in the evenings This is despite restarting OZEMPIC on his last visit Weight has leveled off Currently not exercising.  He appears to be using the sleep mode at night but since yesterday afternoon he has been on the exercise mode  Is concerned that he is needing to replace his insulin cartridge every 2 days or so and even though the pump capacity is 300 units he does not think he is able to use part of his insulin  Recent 2-week data:   CGM use % of time 98  2-week average/SD 159/58  Time in range      70%  % Time Above 180 20  % Time above 250 8  % Time Below 70 1      PRE-MEAL  overnight  mornings  afternoon  evening Overall  Glucose range:       Averages:/SD 162/62 130 155 190/68     PREVIOUSLY:  CGM use % of time  99  2-week average/GV  153+/- 47  Time  in range     76%  % Time Above 180  24  % Time above 250 4  % Time Below 70  0.3     PRE-MEAL Fasting Lunch Dinner Bedtime Overall  Glucose range:       Averages:  136  129  150  173     Glycemic patterns summary: Overall blood sugars are fairly level and stable and averaging mostly between 115-130 during the day but starting to rise around 6 PM with the highest average around 10-11 PM.  More variability occurring after about 7 PM also    Wt Readings from Last 3 Encounters:  09/09/20 296 lb 6.4 oz (134.4 kg)  06/09/20 296 lb (134.3 kg)  03/15/20 282 lb (127.9 kg)    Lab Results  Component Value Date    HGBA1C 7.8 (H) 09/06/2020   HGBA1C 7.4 (H) 06/07/2020   HGBA1C 7.0 (H) 03/08/2020   Lab Results  Component Value Date   MICROALBUR 3.9 (H) 06/07/2020   LDLCALC 53 06/07/2020   CREATININE 1.47 09/06/2020    OTHER problems addressed today are in review of systems    Lab on 09/06/2020  Component Date Value Ref Range Status   Testosterone 09/06/2020 317.07  300.00 - 890.00 ng/dL Final   Sodium 09/06/2020 141  135 - 145 mEq/L Final   Potassium 09/06/2020 4.0  3.5 - 5.1 mEq/L Final   Chloride 09/06/2020 105  96 - 112 mEq/L Final   CO2 09/06/2020 27  19 - 32 mEq/L Final   Glucose, Bld 09/06/2020 138 (A) 70 - 99 mg/dL Final   BUN 09/06/2020 23  6 - 23 mg/dL Final   Creatinine, Ser 09/06/2020 1.47  0.40 - 1.50 mg/dL Final   GFR 09/06/2020 47.28 (A) >60.00 mL/min Final   Calculated using the CKD-EPI Creatinine Equation (2021)   Calcium 09/06/2020 8.5  8.4 - 10.5 mg/dL Final   Hgb A1c MFr Bld 09/06/2020 7.8 (A) 4.6 - 6.5 % Final   Glycemic Control Guidelines for People with Diabetes:Non Diabetic:  <6%Goal of Therapy: <7%Additional Action Suggested:  >8%     Allergies as of 09/09/2020       Reactions   Viagra [sildenafil Citrate]         Medication List        Accurate as of September 09, 2020  8:40 PM. If you have any questions, ask your nurse or doctor.          STOP taking these medications    amLODipine 5 MG tablet Commonly known as: NORVASC Stopped by: Elayne Snare, MD   labetalol 200 MG tablet Commonly known as: NORMODYNE Stopped by: Elayne Snare, MD       TAKE these medications    aspirin 81 MG tablet Take 81 mg by mouth daily.   atorvastatin 20 MG tablet Commonly known as: LIPITOR TAKE 1 TABLET BY MOUTH EVERY DAY   furosemide 20 MG tablet Commonly known as: LASIX TAKE 1 TABLET BY MOUTH EVERY DAY   glucose blood test strip Commonly known as: Visual [employer] Next Test Use as instructed to check blood sugars 4 times per day dx code E10.65   insulin lispro  100 UNIT/ML injection Commonly known as: HumaLOG USE MAX OF 170 UNITS UNDER THE SKIN DAILY VIA INSULIN PUMP.   isradipine 5 MG capsule Commonly known as: DYNACIRC Take 1 capsule (5 mg total) by mouth 2 (two) times daily. Started by: Elayne Snare, MD   methylphenidate 10 MG tablet Commonly  known as: RITALIN Take 1 tablet up to twice a day   olmesartan 20 MG tablet Commonly known as: Benicar Take 1 tablet (20 mg total) by mouth daily. What changed: how much to take   Ozempic (1 MG/DOSE) 4 MG/3ML Sopn Generic drug: Semaglutide (1 MG/DOSE) INJECT 1 MG INTO THE SKIN ONCE A WEEK. INJECT 1MG  UNDER THE SKIN ONCE WEEKLY.   PARoxetine 40 MG tablet Commonly known as: PAXIL TAKE 1 TABLET BY MOUTH EVERYDAY AT BEDTIME   Testosterone 20.25 MG/ACT (1.62%) Gel Apply 2 pumps on each upper arm daily after shower   Vitamin D 50 MCG (2000 UT) Caps Take by mouth. Takes 6000 daily        Allergies:  Allergies  Allergen Reactions   Viagra [Sildenafil Citrate]     Past Medical History:  Diagnosis Date   Carpal tunnel syndrome    Depression    Diabetes mellitus without complication (HCC)    ED (erectile dysfunction)    Edema    Hyperlipidemia    Hypertension    Lumbar disc disease    Neuropathy    Stroke [facility])     Past Surgical History:  Procedure Laterality Date   BACK SURGERY     VASECTOMY      Family History  Problem Relation Age of Onset   Cancer Father        Prostate   Diabetes Neg Hx     Social History:  reports that he has never smoked. He has never used smokeless tobacco. He reports that he does not drink alcohol and does not use drugs.  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   Had been on AndroGel 1.62% pump   He is using 5 pumps daily with the following results  Testosterone level as follows:   Lab Results  Component Value Date   TESTOSTERONE 317.07 09/06/2020     HYPERTENSION: Now treated with 40 mg Benicar along with  5 mg amlodipine His PCP increase his Benicar recently, systolic blood pressure 353 with his PCP  He thinks his blood pressure has been mostly about 614 systolic or more  He again says that he has significant edema of his legs bilaterally Currently only taking 20 mg Lasix without any improvement  BP Readings from Last 3 Encounters:  09/09/20 (!) 155/60  06/09/20 (!) 160/82  03/15/20 (!) 167/67      His creatinine is variable  No microalbuminuria His PCP has checked kidney ultrasound and this was normal    Lab Results  Component Value Date   CREATININE 1.47 09/06/2020   CREATININE 1.30 06/07/2020   CREATININE 1.38 03/08/2020     Hypercholesterolemia: Has been controlled on Lipitor 20 mg, has low HDL``   Lab Results  Component Value Date   CHOL 115 06/07/2020   HDL 35.80 (L) 06/07/2020   LDLCALC 53 06/07/2020   LDLDIRECT 67.0 08/02/2015   TRIG 131.0 06/07/2020   CHOLHDL 3 06/07/2020  Has history of retinopathy, has regular follow-up with specialist  Is complaining of recently feeling off balance although this is mostly a feeling of dizziness when he is bending his head forward    EXAM:   BP (!) 155/60   Pulse 72   Ht 5\' 7"  (1.702 m)   Wt 296 lb 6.4 oz (134.4 kg)   SpO2 96%   BMI 46.42 kg/m   3+ lower leg edema present   ASSESSMENT/PLAN:  DIABETES Type I and obesity:  See history of present illness for detailed discussion on current blood sugar patterns  from data obtained from download of devices and problems identified  His A1c is 7.8  He has been using the T-slim pump  He recently has somewhat higher readings with only 70% within target compared to 76 despite using the control IQ  He appears to be more insulin resistant Even with taking 70 units of basal insulin his blood sugars periodically gets significantly high especially in the evenings or overnight However benefiting from using the Dexcom sensor May not benefit from [place] and weight is not improving Also not able to exercise now  Recommendations:  Increase basal rate at 8 PM up to 3.2.  Sensitivity 1: 20 from 10 AM until midnight Needs to  increase his bolus amounts for mealtime coverage especially if eating any higher fat meals or more carbohydrate Consider increasing Ozempic to 2 mg He will stop using the sleep mode at night to allow full correction doses  HYPOGONADISM: Better controlled now   HYPERTENSION: Blood pressure is consistently high This may be partly related to significant amount of edema. Even with 40 mg Benicar if blood pressure is not significantly better Could not tolerate labetalol previously  For now we will switch him to 5 mg isradipine twice daily instead of amlodipine which will also reduce edema Lasix 40 mg daily Nephrology consultation  Positional vertigo: He will follow-up with his PCP regarding this   Patient Instructions  Take 40mg  daily  Change Amlodipine to Isradipine  Do not use sleep mode    Elayne Snare 09/09/2020

## 2020-09-09 NOTE — Patient Instructions (Addendum)
Take 40mg  daily  Change Amlodipine to Isradipine  Do not use sleep mode

## 2020-09-22 ENCOUNTER — Other Ambulatory Visit: Payer: Self-pay | Admitting: Endocrinology

## 2020-09-30 ENCOUNTER — Encounter: Payer: Self-pay | Admitting: Dietician

## 2020-09-30 ENCOUNTER — Encounter: Payer: Medicare PPO | Attending: Endocrinology | Admitting: Dietician

## 2020-09-30 ENCOUNTER — Other Ambulatory Visit: Payer: Self-pay

## 2020-09-30 DIAGNOSIS — E1021 Type 1 diabetes mellitus with diabetic nephropathy: Secondary | ICD-10-CM | POA: Diagnosis present

## 2020-09-30 DIAGNOSIS — N1831 Chronic kidney disease, stage 3a: Secondary | ICD-10-CM | POA: Insufficient documentation

## 2020-09-30 DIAGNOSIS — E1022 Type 1 diabetes mellitus with diabetic chronic kidney disease: Secondary | ICD-10-CM

## 2020-09-30 NOTE — Patient Instructions (Addendum)
Resource: Consider putting the [employer] app on your phone to help you make mindful decisions when eating out.  Aim for less than 2000 mg sodium daily  Add vegetables, small amount of protein, carbohydrate

## 2020-09-30 NOTE — Progress Notes (Addendum)
Diabetes Self-Management Education  Visit Type: First/Initial  Appt. Start Time: 1345 Appt. End Time: 1500  10/04/2020  Paul Adkins, identified by name and date of birth, is a 75 y.o. male with a diagnosis of Diabetes: Type 1.   ASSESSMENT Patient is here today alone.  He would like to learn more about kidney friendly eating that leads to weight loss. Complains of increased alarms on the T-slim insulin pump - messaged Linda, [facility]ES to contact him about this  History includes Type 1 Diabetes, CKD He uses a T-slim insulin pump and Dexcom CGM He changes his pump set every 2 days or more depending on his insulin usage.  I:C = 1:4 Labs noted to include:  A1C 7.8% 09/06/2020 increased from 06/07/2020 7.4%, GFR 47 09/06/2020, vitamin D 58 10/29/2019 Medication includes:  Novolog via pump.  He has a prescription for Ozempic but is not using (low dose and felt it was not working) - discussed restarting and benefits at a higher dose  Weight hx: 296 lbs 09/30/2020 282 lbs 02/2020  Estimated needs:  2000 calories, 70 grams protein daily  Patient lives alone. He is a retired Chief Operating Officer. He occasionally walks due to increased hip pain and SOB and mild chest pain.  He does have a stationary bike but does not use.  He has no pain with the bike.  Weight 296 lb (134.3 kg). Body mass index is 46.36 kg/m.   Diabetes Self-Management Education - 09/30/20 1108       Visit Information   Visit Type First/Initial      Initial Visit   Diabetes Type Type 1    Are you currently following a meal plan? Yes    Are you taking your medications as prescribed? Yes    Date Diagnosed [place]   How would you rate your overall health? Poor      Psychosocial Assessment   Patient Belief/Attitude about Diabetes Other (comment)   numb   Self-care barriers None    Self-management support Doctor's office    Other persons present Patient    Patient Concerns Nutrition/Meal planning     Special Needs None    Preferred Learning Style No preference indicated    Learning Readiness Ready    How often do you need to have someone help you when you read instructions, pamphlets, or other written materials from your doctor or pharmacy? 1 - Never    What is the last grade level you completed in school? PhD      Pre-Education Assessment   Patient understands the diabetes disease and treatment process. Needs Review    Patient understands incorporating nutritional management into lifestyle. Needs Review    Patient undertands incorporating physical activity into lifestyle. Needs Review    Patient understands using medications safely. Demonstrates understanding / competency    Patient understands monitoring blood glucose, interpreting and using results Demonstrates understanding / competency    Patient understands prevention, detection, and treatment of acute complications. Demonstrates understanding / competency    Patient understands prevention, detection, and treatment of chronic complications. Demonstrates understanding / competency    Patient understands how to develop strategies to address psychosocial issues. Demonstrates understanding / competency    Patient understands how to develop strategies to promote health/change behavior. Needs Review      Complications   Last HgB A1C per patient/outside source 7.8 %   09/06/20 increased from 7.4% 4/11/22022   How often do you check  your blood sugar? > 4 times/day    Fasting Blood glucose range (mg/dL) 70-129    Postprandial Blood glucose range (mg/dL) 180-200;>200    Number of hypoglycemic episodes per month 6    Can you tell when your blood sugar is low? Yes    What do you do if your blood sugar is low? 10 grapes or apple    Have you had a dilated eye exam in the past 12 months? Yes    Have you had a dental exam in the past 12 months? Yes    Are you checking your feet? Yes    How many days per week are you checking your feet? 7       Dietary Intake   Breakfast 2-3 eggs or SKIPS when oversleeps    Snack (morning) none    Lunch 1/2 cup marinated artichokes, plant based sausage    Snack (afternoon) 2 slices bread or "anything"    Dinner [employer] salad with breaded chicken without dressing, cauliflower    Snack (evening) "anything and increased portions throughout the night" AND occasionally wakes and eats a PB&J sandwich    Beverage(s) water, unsweetened iced tea      Exercise   Exercise Type ADL's      Patient Education   Previous Diabetes Education Yes (please comment)    Disease state  Other (comment)   insulin resistance   Nutrition management  Food label reading, portion sizes and measuring food.;Role of diet in the treatment of diabetes and the relationship between the three main macronutrients and blood glucose level;Carbohydrate counting;Meal options for control of blood glucose level and chronic complications.    Physical activity and exercise  Helped patient identify appropriate exercises in relation to his/her diabetes, diabetes complications and other health issue.;Role of exercise on diabetes management, blood pressure control and cardiac health.    Medications Reviewed patients medication for diabetes, action, purpose, timing of dose and side effects.    Chronic complications Relationship between chronic complications and blood glucose control    Psychosocial adjustment Identified and addressed patients feelings and concerns about diabetes      Individualized Goals (developed by patient)   Nutrition General guidelines for healthy choices and portions discussed;Other (comment)   carb count   Physical Activity Exercise 3-5 times per week;15 minutes per day    Medications take my medication as prescribed    Reducing Risk examine blood glucose patterns      Post-Education Assessment   Patient understands the diabetes disease and treatment process. Demonstrates understanding / competency    Patient understands  incorporating nutritional management into lifestyle. Needs Review    Patient undertands incorporating physical activity into lifestyle. Demonstrates understanding / competency    Patient understands using medications safely. Demonstrates understanding / competency    Patient understands monitoring blood glucose, interpreting and using results Demonstrates understanding / competency    Patient understands prevention, detection, and treatment of acute complications. Demonstrates understanding / competency    Patient understands prevention, detection, and treatment of chronic complications. Demonstrates understanding / competency    Patient understands how to develop strategies to address psychosocial issues. Demonstrates understanding / competency    Patient understands how to develop strategies to promote health/change behavior. Needs Review      Outcomes   Expected Outcomes Demonstrated interest in learning. Expect positive outcomes    Future DMSE PRN    Program Status Completed             Individualized  Plan for Diabetes Self-Management Training:   Learning Objective:  Patient will have a greater understanding of diabetes self-management. Patient education plan is to attend individual and/or group sessions per assessed needs and concerns.   Plan:   Patient Instructions  Resource: Consider putting the [employer] app on your phone to help you make mindful decisions when eating out.  Aim for less than 2000 mg sodium daily  Add vegetables, small amount of protein, carbohydrate     Expected Outcomes:  Demonstrated interest in learning. Expect positive outcomes  Education material provided: Meal plan card; sample meal plans from AND; chronic kidney disease nutrition therapy from AND  If problems or questions, patient to contact team via:  Phone  Future DSME appointment: PRN

## 2020-10-05 ENCOUNTER — Telehealth: Payer: Self-pay | Admitting: Nutrition

## 2020-10-05 NOTE — Telephone Encounter (Signed)
LVM to call me about pump alarms he is having during the night

## 2020-10-14 ENCOUNTER — Other Ambulatory Visit: Payer: Self-pay | Admitting: Endocrinology

## 2020-10-20 NOTE — Telephone Encounter (Signed)
Patient called saying he was able to stop alarms that were telling him that he needed to bolus.  He says this was happening during the night.  He reports that he is not certain how he did this, but it is no longer alarming for him during the night.   He reports that his blood sugars are "great" now, and he is "very pleased with this pump."

## 2020-10-21 ENCOUNTER — Other Ambulatory Visit: Payer: Self-pay | Admitting: Endocrinology

## 2020-11-04 ENCOUNTER — Encounter: Payer: Medicare PPO | Attending: Endocrinology | Admitting: Dietician

## 2020-11-04 ENCOUNTER — Encounter: Payer: Self-pay | Admitting: Dietician

## 2020-11-04 DIAGNOSIS — E108 Type 1 diabetes mellitus with unspecified complications: Secondary | ICD-10-CM | POA: Diagnosis not present

## 2020-11-04 NOTE — Progress Notes (Signed)
Diabetes Self-Management Education  Visit Type: Follow-up  Appt. Start Time: 1300 Appt. End Time: 1330  11/08/2020  [name], identified by name and date of birth, is a 75 y.o. male with a diagnosis of Diabetes: Type 1.   ASSESSMENT Patient has been eating on a smaller plate. He has bought a scale to help him get a better Percussion massage therapy.  Tried exercise a couple of times (5 minutes at a time).  Then developed chest pain ("not caused by exercise"). Energy level is low - unchanged.    Looks at the meal plan card every other day or so.  Meal plans a day in advance.  When he does not do this he over eats. Started eating on a smaller size plate  He states that his blood glucose levels were pretty good until the last week.  "Working on breaking a long term habit of overeating."  History includes Type 1 Diabetes, CKD He uses a T-slim insulin pump and Dexcom CGM He changes his pump set every 2 days or more depending on his insulin usage.  I:C = 1:4 Labs noted to include:  A1C 7.8% 09/06/2020 increased from 06/07/2020 7.4% GFR 477/12/2020 47, 09/06/2020, vitamin D 58 10/29/2019 Medication includes:  Novolog via pump.  He has a prescription for Ozempic but is not using (low dose and felt it was not working) - discussed restarting and benefits at a higher dose   Weight hx: 288 lbs 11/04/2020 lost with habit changes and diuresis 296 lbs 09/30/2020 282 lbs 02/2020   Estimated needs:  2000 calories, 70 grams protein daily   Patient lives alone. He is a retired Chief Operating Officer. He occasionally walks due to increased hip pain and SOB and mild chest pain.  He does have a stationary bike but does not use.  He has no pain with the bike.  Weight 288 lb (130.6 kg). Body mass index is 45.11 kg/m.   Diabetes Self-Management Education - 11/08/20 1118       Visit Information   Visit Type Follow-up      Initial Visit   Diabetes Type Type 1    Are you taking your medications as  prescribed? Yes      Psychosocial Assessment   Other persons present Patient      Pre-Education Assessment   Patient understands the diabetes disease and treatment process. Demonstrates understanding / competency    Patient understands incorporating nutritional management into lifestyle. Needs Review    Patient undertands incorporating physical activity into lifestyle. Needs Review    Patient understands using medications safely. Demonstrates understanding / competency    Patient understands monitoring blood glucose, interpreting and using results Demonstrates understanding / competency    Patient understands prevention, detection, and treatment of chronic complications. Demonstrates understanding / competency    Patient understands how to develop strategies to address psychosocial issues. Demonstrates understanding / competency    Patient understands how to develop strategies to promote health/change behavior. Needs Review      Complications   Last HgB A1C per patient/outside source 7.8 %   09/06/2020   How often do you check your blood sugar? > 4 times/day      Dietary Intake   Breakfast 2 eggs, fruit OR boiled egg and prunes    Snack (morning) none    Lunch 3 small plant based sausage, marinated artichokes, green beans OR Sonic burger today    Snack (afternoon) none    Dinner similar to lunch and extra bread  Beverage(s) water, unsweetened tea      Exercise   Exercise Type ADL's      Patient Education   Previous Diabetes Education Yes (please comment)   09/06/2020   Nutrition management  Other (comment)   current choices   Personal strategies to promote health Lifestyle issues that need to be addressed for better diabetes care      Individualized Goals (developed by patient)   Nutrition Other (comment)   carb counting, portion control   Physical Activity Not Applicable   until cleared by cardiologist   Medications take my medication as prescribed    Monitoring  test my blood  glucose as discussed    Reducing Risk examine blood glucose patterns      Patient Self-Evaluation of Goals - Patient rates self as meeting previously set goals (% of time)   Nutrition >75%    Physical Activity Not Applicable    Medications >75%    Monitoring >75%    Problem Solving >75%    Reducing Risk >75%    Health Coping >75%      Post-Education Assessment   Patient understands the diabetes disease and treatment process. Demonstrates understanding / competency    Patient understands incorporating nutritional management into lifestyle. Needs Review    Patient undertands incorporating physical activity into lifestyle. Demonstrates understanding / competency    Patient understands using medications safely. Demonstrates understanding / competency    Patient understands monitoring blood glucose, interpreting and using results Demonstrates understanding / competency    Patient understands prevention, detection, and treatment of acute complications. Demonstrates understanding / competency    Patient understands prevention, detection, and treatment of chronic complications. Demonstrates understanding / competency    Patient understands how to develop strategies to address psychosocial issues. Demonstrates understanding / competency    Patient understands how to develop strategies to promote health/change behavior. Needs Review      Outcomes   Expected Outcomes Demonstrated interest in learning. Expect positive outcomes    Future DMSE 2 months    Program Status Not Completed      Subsequent Visit   Since your last visit have you experienced any weight changes? Loss    Weight Loss (lbs) 13             Individualized Plan for Diabetes Self-Management Training:   Learning Objective:  Patient will have a greater understanding of diabetes self-management. Patient education plan is to attend individual and/or group sessions per assessed needs and concerns.   Plan:   Patient  Instructions  Continue mindfulness  Listen to your body.    Are you eating (snacking) when you are not hungry?  Aim for less than 2000 mg sodium daily   Add vegetables, small amount of protein, carbohydrate Expected Outcomes:  Demonstrated interest in learning. Expect positive outcomes  Education material provided:   If problems or questions, patient to contact team via:  Phone  Future DSME appointment: 2 months

## 2020-11-08 NOTE — Patient Instructions (Signed)
Continue mindfulness  Listen to your body.    Are you eating (snacking) when you are not hungry?  Aim for less than 2000 mg sodium daily   Add vegetables, small amount of protein, carbohydrate

## 2020-12-07 ENCOUNTER — Other Ambulatory Visit: Payer: Self-pay

## 2020-12-07 ENCOUNTER — Other Ambulatory Visit (INDEPENDENT_AMBULATORY_CARE_PROVIDER_SITE_OTHER): Payer: Medicare PPO

## 2020-12-07 ENCOUNTER — Encounter: Payer: Medicare PPO | Attending: Endocrinology | Admitting: Dietician

## 2020-12-07 DIAGNOSIS — E1069 Type 1 diabetes mellitus with other specified complication: Secondary | ICD-10-CM | POA: Diagnosis not present

## 2020-12-07 DIAGNOSIS — N1831 Chronic kidney disease, stage 3a: Secondary | ICD-10-CM | POA: Diagnosis not present

## 2020-12-07 DIAGNOSIS — E1065 Type 1 diabetes mellitus with hyperglycemia: Secondary | ICD-10-CM

## 2020-12-07 LAB — COMPREHENSIVE METABOLIC PANEL
ALT: 25 U/L (ref 0–53)
AST: 20 U/L (ref 0–37)
Albumin: 3.8 g/dL (ref 3.5–5.2)
Alkaline Phosphatase: 67 U/L (ref 39–117)
BUN: 23 mg/dL (ref 6–23)
CO2: 24 mEq/L (ref 19–32)
Calcium: 8.9 mg/dL (ref 8.4–10.5)
Chloride: 108 mEq/L (ref 96–112)
Creatinine, Ser: 1.32 mg/dL (ref 0.40–1.50)
GFR: 53.7 mL/min — ABNORMAL LOW (ref >60.00)
Glucose, Bld: 102 mg/dL — ABNORMAL HIGH (ref 70–99)
Potassium: 3.9 mEq/L (ref 3.5–5.1)
Sodium: 139 mEq/L (ref 135–145)
Total Bilirubin: 0.3 mg/dL (ref 0.2–1.2)
Total Protein: 6.4 g/dL (ref 6.0–8.3)

## 2020-12-07 LAB — HEMOGLOBIN A1C: Hgb A1c MFr Bld: 6.8 % — ABNORMAL HIGH (ref 4.6–6.5)

## 2020-12-07 NOTE — Progress Notes (Signed)
Diabetes Self-Management Education  Visit Type: Follow-up  Appt. Start Time: 1215 Appt. End Time: 0254  12/10/2020  [name], identified by name and date of birth, is a 75 y.o. male with a diagnosis of Diabetes:  .   ASSESSMENT Patient is here today alone.  He was last seen by myself on 11/04/2020. He states that he has been eating more "junk" food over the past 2 weeks - [employer], [employer], [employer].  Eats out of boredom. He thinks more of categories (balance of plate). He was meal planning each night and follows this the next day.   He did well for 2 weeks and then stopped the routine. He has created a 4 day meal plan.  It is quite simple.  Low in sodium.  Works well for patient and he states that it is satisfying. Weight has increased to 295 lbs.  Patient thinks this may be fluid as he is currently not on the lasix and with high sodium fast food recently. Working on breaking a long term habit of overeating.  History includes Type 1 Diabetes, CKD He uses a T-slim insulin pump and Dexcom CGM He changes his pump set every 2 days or more depending on his insulin usage.  I:C = 1:4 Labs noted to include:  A1C 6.8% decreased from 7.8% 09/06/2020, GFR 53, BUN 23, Creatine 1.32, Potassium 39, 12/07/20.  vitamin D 58 10/29/2019 Medication includes:  Novolog via pump.  He has a prescription for Ozempic but is not using (low dose and felt it was not working) - discussed restarting and benefits at a higher dose   Weight hx: 295 lbs 12/07/20 288 lbs 11/04/2020 lost with habit changes and diuresis 296 lbs 09/30/2020 282 lbs 02/2020   Estimated needs:  2000 calories, 70 grams protein daily   Patient lives alone. He is a retired Chief Operating Officer. He occasionally walks due to increased hip pain and SOB and mild chest pain.  He does have a stationary bike but does not use.  He has no pain with the bike.  Weight 295 lb (133.8 kg). Body mass index is 46.2 kg/m.   Diabetes Self-Management  Education - 12/10/20 1000       Visit Information   Visit Type Follow-up      Pre-Education Assessment   Patient understands the diabetes disease and treatment process. Demonstrates understanding / competency    Patient understands incorporating nutritional management into lifestyle. Needs Review    Patient undertands incorporating physical activity into lifestyle. Needs Review    Patient understands using medications safely. Demonstrates understanding / competency    Patient understands monitoring blood glucose, interpreting and using results Demonstrates understanding / competency    Patient understands prevention, detection, and treatment of acute complications. Demonstrates understanding / competency    Patient understands prevention, detection, and treatment of chronic complications. Demonstrates understanding / competency    Patient understands how to develop strategies to address psychosocial issues. Demonstrates understanding / competency    Patient understands how to develop strategies to promote health/change behavior. Needs Review      Complications   Last HgB A1C per patient/outside source 6.8 %   12/07/20   How often do you check your blood sugar? > 4 times/day      Exercise   Exercise Type ADL's      Patient Education   Previous Diabetes Education Yes (please comment)    Nutrition management  Meal options for control of blood glucose level and chronic complications.  Psychosocial adjustment Worked with patient to identify barriers to care and solutions    Personal strategies to promote health Lifestyle issues that need to be addressed for better diabetes care      Individualized Goals (developed by patient)   Nutrition General guidelines for healthy choices and portions discussed;Other (comment)   carb count   Physical Activity Not Applicable    Medications take my medication as prescribed    Monitoring  test my blood glucose as discussed    Reducing Risk examine  blood glucose patterns      Patient Self-Evaluation of Goals - Patient rates self as meeting previously set goals (% of time)   Nutrition >75%    Physical Activity Not Applicable    Medications >75%    Monitoring >75%    Problem Solving >75%    Reducing Risk >75%    Health Coping >75%      Post-Education Assessment   Patient understands the diabetes disease and treatment process. Demonstrates understanding / competency    Patient understands incorporating nutritional management into lifestyle. Demonstrates understanding / competency    Patient undertands incorporating physical activity into lifestyle. Demonstrates understanding / competency    Patient understands using medications safely. Demonstrates understanding / competency    Patient understands monitoring blood glucose, interpreting and using results Demonstrates understanding / competency    Patient understands prevention, detection, and treatment of acute complications. Demonstrates understanding / competency    Patient understands prevention, detection, and treatment of chronic complications. Demonstrates understanding / competency    Patient understands how to develop strategies to address psychosocial issues. Demonstrates understanding / competency    Patient understands how to develop strategies to promote health/change behavior. Needs Review      Outcomes   Expected Outcomes Demonstrated interest in learning. Expect positive outcomes    Future DMSE 2 months    Program Status Not Completed      Subsequent Visit   Since your last visit have you experienced any weight changes? Gain    Weight Gain (lbs) 7             Individualized Plan for Diabetes Self-Management Training:   Learning Objective:  Patient will have a greater understanding of diabetes self-management. Patient education plan is to attend individual and/or group sessions per assessed needs and concerns.   Plan:   There are no Patient Instructions on  file for this visit.  Expected Outcomes:  Demonstrated interest in learning. Expect positive outcomes  Education material provided:   If problems or questions, patient to contact team via:  Phone  Future DSME appointment: 2 months

## 2020-12-10 ENCOUNTER — Encounter: Payer: Self-pay | Admitting: Dietician

## 2020-12-10 ENCOUNTER — Encounter: Payer: Self-pay | Admitting: Endocrinology

## 2020-12-10 ENCOUNTER — Ambulatory Visit: Payer: Medicare PPO | Admitting: Endocrinology

## 2020-12-10 ENCOUNTER — Other Ambulatory Visit: Payer: Self-pay

## 2020-12-10 VITALS — BP 150/62 | HR 69 | Ht 67.0 in | Wt 297.2 lb

## 2020-12-10 DIAGNOSIS — Z23 Encounter for immunization: Secondary | ICD-10-CM

## 2020-12-10 DIAGNOSIS — E1065 Type 1 diabetes mellitus with hyperglycemia: Secondary | ICD-10-CM | POA: Diagnosis not present

## 2020-12-10 DIAGNOSIS — I10 Essential (primary) hypertension: Secondary | ICD-10-CM

## 2020-12-10 DIAGNOSIS — R600 Localized edema: Secondary | ICD-10-CM | POA: Diagnosis not present

## 2020-12-10 NOTE — Progress Notes (Signed)
Patient ID: Paul Adkins, male   DOB: 06-Jun-1945, 75 y.o.   MRN: [id number]   Reason for visit:   follow-up  Diagnosis: Type 1 diabetes, date of onset 1978  PAST history: He has had persistently poorly controlled diabetes for several years. A1c has been high with usual range 8.2 -9, overall improved since 2013. Compliance with glucose monitoring and diet has been variable and he does better when he is checking more sugars. His A1c was better than usual in 3/14 at 7.5 but he was also having significant hypoglycemia overnight and also occasionally later in the day. At that time he was started on Invokana 300 mg daily which helped him with glucose control and mild weight loss  CURRENT insulin pump brand: T-insulin  PUMP SETTINGS are:   Basal rates: 2.8 from midnight- 4 AM. 4 AM = 2.75.  8 AM = 2.9.  8 PM = 3.2 Boluses : 1 unit for 4 g carbs at mealtimes  Glucose target 110-120, sensitivity 1: 20, with active insulin 5 hours.  Changing tubing every 1.9 days Recent insulin requirement average 149 units/day  Non-insulin HYPOGLYCEMIC drugs: Ozempic 1 mg weekly  RECENT history:   His A1c is improving now and back to 6.8 which is his best level  Current glycemic patterns, management and problems:  Glycemic patterns were reviewed from his pump download  Interpretation of the CGM for the last 2 weeks as follows: Blood sugars are generally on an average within the target range but going progressively higher after about 7 PM until about 3 AM with highest average about 220.  Also has more variability during those times but otherwise blood sugars are as low as 142 average in the morning segment  Hyperglycemia is most prominent in the evenings at variable times and continuing to a variable extent during the night  Only occasionally has relatively high readings after lunch  Hypoglycemia has been minimal with lowest reading 60 preceded by high sugars during the night  POSTPRANDIAL readings  are generally very controlled after breakfast and lunch but periodically are significantly high after evening meal and snacks  OVERNIGHT blood sugars are mostly high but variable especially after midnight  He is having generally good control except when he is getting more snacks late evening after supper  Carbohydrate coverage appears to be adequate at most meals with relatively good readings at breakfast and lunch  He thinks that most of his higher readings after supper are from excessive snacks which may be higher fat not controlled with even extra boluses  Again has difficulty watching his diet despite working with the dietitian Because of being in the sleep mode during the night he will frequently take 8 to 10 units bolus during the night, occasionally multiple times with variable improvement  Compared to his last visit his time in range is only slightly less  Continues to take Ozempic regularly Weight likely affected by his fluid status Again not exercising.   Recent 2-week data:    CGM use % of time 99  2-week average/SD 167/52  Time in range      67%  % Time Above 180 33  % Time above 250   % Time Below 70 0      PRE-MEAL  overnight  mornings  afternoon  evening Overall  Glucose range:       Averages: 180 142 155 191/58    PREVIOUSLY:   CGM use % of time 98  2-week average/SD 159/58  Time in range      70%  % Time Above 180 20  % Time above 250 8  % Time Below 70 1      PRE-MEAL  overnight  mornings  afternoon  evening Overall  Glucose range:       Averages:/SD 162/62 130 155 190/68        Wt Readings from Last 3 Encounters:  12/10/20 297 lb 3.2 oz (134.8 kg)  12/10/20 295 lb (133.8 kg)  11/08/20 288 lb (130.6 kg)    Lab Results  Component Value Date   HGBA1C 6.8 (H) 12/07/2020   HGBA1C 7.8 (H) 09/06/2020   HGBA1C 7.4 (H) 06/07/2020   Lab Results  Component Value Date   MICROALBUR 3.9 (H) 06/07/2020   LDLCALC 53 06/07/2020   CREATININE 1.32  12/07/2020    OTHER problems addressed today are in review of systems    Lab on 12/07/2020  Component Date Value Ref Range Status   Sodium 12/07/2020 139  135 - 145 mEq/L Final   Potassium 12/07/2020 3.9  3.5 - 5.1 mEq/L Final   Chloride 12/07/2020 108  96 - 112 mEq/L Final   CO2 12/07/2020 24  19 - 32 mEq/L Final   Glucose, Bld 12/07/2020 102 (A) 70 - 99 mg/dL Final   BUN 12/07/2020 23  6 - 23 mg/dL Final   Creatinine, Ser 12/07/2020 1.32  0.40 - 1.50 mg/dL Final   Total Bilirubin 12/07/2020 0.3  0.2 - 1.2 mg/dL Final   Alkaline Phosphatase 12/07/2020 67  39 - 117 U/L Final   AST 12/07/2020 20  0 - 37 U/L Final   ALT 12/07/2020 25  0 - 53 U/L Final   Total Protein 12/07/2020 6.4  6.0 - 8.3 g/dL Final   Albumin 12/07/2020 3.8  3.5 - 5.2 g/dL Final   GFR 12/07/2020 53.70 (A) >60.00 mL/min Final   Calculated using the CKD-EPI Creatinine Equation (2021)   Calcium 12/07/2020 8.9  8.4 - 10.5 mg/dL Final   Hgb A1c MFr Bld 12/07/2020 6.8 (A) 4.6 - 6.5 % Final   Glycemic Control Guidelines for People with Diabetes:Non Diabetic:  <6%Goal of Therapy: <7%Additional Action Suggested:  >8%     Allergies as of 12/10/2020       Reactions   Viagra [sildenafil Citrate]         Medication List        Accurate as of December 10, 2020 11:59 PM. If you have any questions, ask your nurse or doctor.          aspirin 81 MG tablet Take 81 mg by mouth daily.   atorvastatin 20 MG tablet Commonly known as: LIPITOR TAKE 1 TABLET BY MOUTH EVERY DAY   furosemide 20 MG tablet Commonly known as: LASIX TAKE 1 TABLET BY MOUTH EVERY DAY   glucose blood test strip Commonly known as: Visual merchandiser Next Test Use as instructed to check blood sugars 4 times per day dx code E10.65   insulin aspart 100 UNIT/ML injection Commonly known as: novoLOG USE MAX OF 170 UNITS UNDER THE SKIN DAILY VIA INSULIN PUMP.   isradipine 5 MG capsule Commonly known as: DYNACIRC TAKE 1 CAPSULE BY MOUTH 2 TIMES  DAILY.   methylphenidate 10 MG tablet Commonly known as: RITALIN Take 1 tablet up to twice a day   olmesartan 20 MG tablet Commonly known as: Benicar Take 1 tablet (20 mg total) by mouth daily. What changed: how much to take   Ozempic (1  MG/DOSE) 4 MG/3ML Sopn Generic drug: Semaglutide (1 MG/DOSE) INJECT 1 MG INTO THE SKIN ONCE A WEEK. INJECT 1MG  UNDER THE SKIN ONCE WEEKLY.   PARoxetine 40 MG tablet Commonly known as: PAXIL TAKE 1 TABLET BY MOUTH EVERYDAY AT BEDTIME   Testosterone 20.25 MG/ACT (1.62%) Gel APPLY 2 PUMPS ON EACH UPPER ARM DAILY AFTER SHOWER   Vitamin D 50 MCG (2000 UT) Caps Take by mouth. Takes 6000 daily        Allergies:  Allergies  Allergen Reactions   Viagra [Sildenafil Citrate]     Past Medical History:  Diagnosis Date   Carpal tunnel syndrome    Depression    Diabetes mellitus without complication (HCC)    ED (erectile dysfunction)    Edema    Hyperlipidemia    Hypertension    Lumbar disc disease    Neuropathy    Stroke [facility])     Past Surgical History:  Procedure Laterality Date   BACK SURGERY     VASECTOMY      Family History  Problem Relation Age of Onset   Cancer Father        Prostate   Diabetes Neg Hx     Social History:  reports that he has never smoked. He has never used smokeless tobacco. He reports that he does not drink alcohol and does not use drugs.  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   Had been on AndroGel 1.62%  pump   He is using 5 pumps daily with the following results  Testosterone level as follows:   Lab Results  Component Value Date   TESTOSTERONE 317.07 09/06/2020     HYPERTENSION: Now treated with 20 mg Benicar  His Benicar was reduced at some point  Since he was having edema on his last visit he was told to take isradipine instead of amlodipine but with this along with extra Lasix his blood pressure apparently came down to low normal levels  He thinks his blood pressure has been mostly about 323-557 systolic recently  He has been to the nephrologist and no changes made  Currently not taking any Lasix but he is starting to get some edema again  BP Readings from Last 3 Encounters:  12/10/20 (!) 150/62  09/09/20 (!) 155/60  06/09/20 (!) 160/82      His creatinine is variable  No microalbuminuria   Lab Results  Component Value Date   CREATININE 1.32 12/07/2020   CREATININE 1.47 09/06/2020   CREATININE 1.30 06/07/2020     Hypercholesterolemia: Has been controlled on Lipitor 20 mg, has low HDL`` LDL 62 in August 22  Lab Results  Component Value Date   CHOL 115 06/07/2020   HDL 35.80 (L) 06/07/2020   LDLCALC 53 06/07/2020   LDLDIRECT 67.0 08/02/2015   TRIG 131.0 06/07/2020   CHOLHDL 3 06/07/2020    Has history of retinopathy, has regular follow-up with specialist  Is complaining of recently feeling off balance although this is mostly a feeling  of dizziness when he is bending his head forward    EXAM:   BP (!) 150/62   Pulse 69   Ht 5\' 7"  (1.702 m)   Wt 297 lb 3.2 oz (134.8 kg)   SpO2 97%   BMI 46.55 kg/m   1+ lower leg edema present   ASSESSMENT/PLAN:  DIABETES Type I and obesity:  See history of present illness for detailed discussion on current blood sugar patterns from data obtained from download of devices and problems identified  His A1c is 6.8  He has been using the T-slim pump  His recent management and blood sugar patterns were  discussed in detail  Overall still has fairly good control except late evening and overnight partly related to excessive snacks which may be high fat also Not exercising and not losing weight even with Ozempic  Currently appears to be getting adequate carbohydrate coverage Although he is generally having high readings early part of the night this is not consistent He does take additional boluses for correction with sometimes no significant benefit  At present will not make any specific changes but he will try to work on his diet Consider 2 mg Ozempic when this is more freely available and also Mounjaro if this is Medicare approved Recommended that he try without the sleep mode to allow automatic corrections by the pump  HYPOGONADISM: To have testosterone level checked again on the next visit   Mild CKD: Creatinine appears somewhat better   HYPERTENSION: Blood pressure is high He has been followed by his PCP and also has been seen by nephrologist This may be related to his being off diuretics and getting edema Only on 20 mg Benicar For now we will have him take 20 mg of Lasix regularly, may consider either increasing his Benicar or adding back isradipine if blood pressure continues to be high   Patient Instructions  Take 20mg  Furosemide  Flu vaccine given  Elayne Snare 12/11/2020

## 2020-12-10 NOTE — Patient Instructions (Signed)
Take 20mg  Furosemide

## 2020-12-23 ENCOUNTER — Other Ambulatory Visit: Payer: Self-pay | Admitting: Endocrinology

## 2020-12-26 ENCOUNTER — Other Ambulatory Visit: Payer: Self-pay | Admitting: Endocrinology

## 2021-02-26 ENCOUNTER — Other Ambulatory Visit: Payer: Self-pay | Admitting: Endocrinology

## 2021-03-01 ENCOUNTER — Other Ambulatory Visit: Payer: Self-pay

## 2021-03-01 ENCOUNTER — Encounter (INDEPENDENT_AMBULATORY_CARE_PROVIDER_SITE_OTHER): Payer: Medicare PPO | Admitting: Ophthalmology

## 2021-03-01 DIAGNOSIS — H43813 Vitreous degeneration, bilateral: Secondary | ICD-10-CM

## 2021-03-01 DIAGNOSIS — E113391 Type 2 diabetes mellitus with moderate nonproliferative diabetic retinopathy without macular edema, right eye: Secondary | ICD-10-CM | POA: Diagnosis not present

## 2021-03-01 DIAGNOSIS — E113592 Type 2 diabetes mellitus with proliferative diabetic retinopathy without macular edema, left eye: Secondary | ICD-10-CM | POA: Diagnosis not present

## 2021-03-01 DIAGNOSIS — I10 Essential (primary) hypertension: Secondary | ICD-10-CM | POA: Diagnosis not present

## 2021-03-01 DIAGNOSIS — D3132 Benign neoplasm of left choroid: Secondary | ICD-10-CM

## 2021-03-01 DIAGNOSIS — H35033 Hypertensive retinopathy, bilateral: Secondary | ICD-10-CM

## 2021-03-10 ENCOUNTER — Other Ambulatory Visit: Payer: Self-pay

## 2021-03-10 ENCOUNTER — Other Ambulatory Visit (INDEPENDENT_AMBULATORY_CARE_PROVIDER_SITE_OTHER): Payer: Medicare PPO

## 2021-03-10 DIAGNOSIS — E1065 Type 1 diabetes mellitus with hyperglycemia: Secondary | ICD-10-CM

## 2021-03-10 LAB — BASIC METABOLIC PANEL
BUN: 26 mg/dL — ABNORMAL HIGH (ref 6–23)
CO2: 29 mEq/L (ref 19–32)
Calcium: 8.7 mg/dL (ref 8.4–10.5)
Chloride: 105 mEq/L (ref 96–112)
Creatinine, Ser: 1.47 mg/dL (ref 0.40–1.50)
GFR: 47.11 mL/min — ABNORMAL LOW (ref >60.00)
Glucose, Bld: 104 mg/dL — ABNORMAL HIGH (ref 70–99)
Potassium: 4.6 mEq/L (ref 3.5–5.1)
Sodium: 140 mEq/L (ref 135–145)

## 2021-03-10 LAB — HEMOGLOBIN A1C: Hgb A1c MFr Bld: 7.3 % — ABNORMAL HIGH (ref 4.6–6.5)

## 2021-03-14 ENCOUNTER — Other Ambulatory Visit: Payer: Self-pay

## 2021-03-14 ENCOUNTER — Ambulatory Visit: Payer: Medicare PPO | Admitting: Endocrinology

## 2021-03-14 ENCOUNTER — Encounter: Payer: Self-pay | Admitting: Endocrinology

## 2021-03-14 VITALS — BP 150/60 | HR 72 | Ht 67.0 in | Wt 297.8 lb

## 2021-03-14 DIAGNOSIS — I10 Essential (primary) hypertension: Secondary | ICD-10-CM

## 2021-03-14 DIAGNOSIS — E7800 Pure hypercholesterolemia, unspecified: Secondary | ICD-10-CM | POA: Diagnosis not present

## 2021-03-14 DIAGNOSIS — E291 Testicular hypofunction: Secondary | ICD-10-CM

## 2021-03-14 DIAGNOSIS — E1065 Type 1 diabetes mellitus with hyperglycemia: Secondary | ICD-10-CM

## 2021-03-14 MED ORDER — ISRADIPINE 2.5 MG PO CAPS: 2.5000 mg | ORAL_CAPSULE | Freq: Every day | ORAL | 2 refills | Status: DC

## 2021-03-14 NOTE — Patient Instructions (Signed)
Add isradipine in pm for blood pressure  [facility]

## 2021-03-14 NOTE — Progress Notes (Signed)
Patient ID: Paul Adkins, male   DOB: [birth year], 76 y.o.   MRN: [id number]   Reason for visit:   follow-up  Diagnosis: Type 1 diabetes, date of onset 1978  PAST history: He has had persistently poorly controlled diabetes for several years. A1c has been high with usual range 8.2 -9, overall improved since 2013. Compliance with glucose monitoring and diet has been variable and he does better when he is checking more sugars. His A1c was better than usual in 3/14 at 7.5 but he was also having significant hypoglycemia overnight and also occasionally later in the day. At that time he was started on Invokana 300 mg daily which helped him with glucose control and mild weight loss  CURRENT insulin pump brand: T-slim  PUMP SETTINGS are:   Basal rates: 2.8 from midnight- 4 AM. 4 AM = 2.75.  8 AM = 2.9.  8 PM = 3.2 Boluses : 1 unit for 4 g carbs at mealtimes  Glucose target 110-120, sensitivity 1: 20, with active insulin 5 hours.  Changing tubing every 1.9 days Recent insulin requirement average 149 units/day  Non-insulin HYPOGLYCEMIC drugs: off Ozempic 1 mg weekly  RECENT history:   His A1c is now 7.3 compared to 6.8 which was his best level  Current glycemic patterns, management and problems:  Glycemic patterns were reviewed from his pump download  Interpretation of the Dexcom data for the last 2 weeks as follows: Blood sugars are controlled during the daytime but significantly higher late evening although has significant variability at that time also Hypoglycemia has been infrequent but may occur between 5-7 PM, occasionally preceded by bolus a couple of hours before POSTPRANDIAL readings are generally level with the Premeal readings and usually not rising excessively after lunch However blood sugars are variable in the evenings and frequently tend to rise progressively until late at night  To be related to snacks OVERNIGHT blood sugars are generally high around bedtime and then  gradually decreasing after 1 AM, usually back down to near target levels on an average by 6-8 AM  No hypoglycemia overnight  Insulin pump management: He is having much higher sugars frequently late in the evenings from eating snacks excessively and usually not covering them with boluses  However even with correction boluses for extra boluses his blood sugars are very frequently over 200 after about 8-9 PM  Currently also has SLEEP mode turned on overnight  Carbohydrate coverage appears to be adequate at most meals and he is sometimes just boluses without carbohydrates  He also said that sometimes he will take extra insulin with injections to control high sugars without much success usually  At times will take up to 20 units overnight to correct his high readings  He stopped taking Ozempic because he did not think it was helping However since late November he has been going off his diet Weight likely fluctuating depending his fluid status although he thinks his weight was down to 283 pounds about 3 weeks ago Again not motivated to do any exercise.   The last 2-week statistics:    CGM use % of time   2-week average/SD   Time in range        %  % Time Above 180   % Time above 250   % Time Below 70       PRE-MEAL  overnight  mornings  afternoon  evening Overall  Glucose range:       Averages: 175 132 163 203  Previously:   CGM use % of time 99  2-week average/SD 167/52  Time in range      67%  % Time Above 180 33  % Time above 250   % Time Below 70 0      PRE-MEAL  overnight  mornings  afternoon  evening Overall  Glucose range:       Averages: 180 142 155 191/58       Wt Readings from Last 3 Encounters:  03/14/21 297 lb 12.8 oz (135.1 kg)  12/10/20 297 lb 3.2 oz (134.8 kg)  12/10/20 295 lb (133.8 kg)    Lab Results  Component Value Date   HGBA1C 7.3 (H) 03/10/2021   HGBA1C 6.8 (H) 12/07/2020   HGBA1C 7.8 (H) 09/06/2020   Lab Results  Component Value Date    MICROALBUR 3.9 (H) 06/07/2020   LDLCALC 53 06/07/2020   CREATININE 1.47 03/10/2021    OTHER problems addressed today are in review of systems    Lab on 03/10/2021  Component Date Value Ref Range Status   Sodium 03/10/2021 140  135 - 145 mEq/L Final   Potassium 03/10/2021 4.6  3.5 - 5.1 mEq/L Final   Chloride 03/10/2021 105  96 - 112 mEq/L Final   CO2 03/10/2021 29  19 - 32 mEq/L Final   Glucose, Bld 03/10/2021 104 (H)  70 - 99 mg/dL Final   BUN 03/10/2021 26 (H)  6 - 23 mg/dL Final   Creatinine, Ser 03/10/2021 1.47  0.40 - 1.50 mg/dL Final   GFR 03/10/2021 47.11 (L)  >60.00 mL/min Final   Calculated using the CKD-EPI Creatinine Equation (2021)   Calcium 03/10/2021 8.7  8.4 - 10.5 mg/dL Final   Hgb A1c MFr Bld 03/10/2021 7.3 (H)  4.6 - 6.5 % Final   Glycemic Control Guidelines for People with Diabetes:Non Diabetic:  <6%Goal of Therapy: <7%Additional Action Suggested:  >8%     Allergies as of 03/14/2021       Reactions   Viagra [sildenafil Citrate]         Medication List        Accurate as of March 14, 2021  3:24 PM. If you have any questions, ask your nurse or doctor.          aspirin 81 MG tablet Take 81 mg by mouth daily.   atorvastatin 20 MG tablet Commonly known as: LIPITOR TAKE 1 TABLET BY MOUTH EVERY DAY   furosemide 20 MG tablet Commonly known as: LASIX TAKE 1 TABLET BY MOUTH EVERY DAY   glucose blood test strip Commonly known as: Visual merchandiser Next Test Use as instructed to check blood sugars 4 times per day dx code E10.65   insulin aspart 100 UNIT/ML injection Commonly known as: novoLOG USE MAX OF 170 UNITS UNDER THE SKIN DAILY VIA INSULIN PUMP.   isradipine 5 MG capsule Commonly known as: DYNACIRC TAKE 1 CAPSULE BY MOUTH 2 TIMES DAILY.   methylphenidate 10 MG tablet Commonly known as: RITALIN Take 1 tablet up to twice a day   olmesartan 20 MG tablet Commonly known as: Benicar Take 1 tablet (20 mg total) by mouth daily. What  changed: how much to take   Ozempic (1 MG/DOSE) 4 MG/3ML Sopn Generic drug: Semaglutide (1 MG/DOSE) INJECT 1 MG INTO THE SKIN ONCE A WEEK. INJECT 1MG  UNDER THE SKIN ONCE WEEKLY.   PARoxetine 40 MG tablet Commonly known as: PAXIL TAKE 1 TABLET BY MOUTH EVERYDAY AT BEDTIME   Testosterone 20.25 MG/ACT (1.62%)  Gel APPLY 2 PUMPS ON EACH UPPER ARM DAILY AFTER SHOWER   Vitamin D 50 MCG (2000 UT) Caps Take by mouth. Takes 6000 daily        Allergies:  Allergies  Allergen Reactions   Viagra [Sildenafil Citrate]     Past Medical History:  Diagnosis Date   Carpal tunnel syndrome    Depression    Diabetes mellitus without complication (HCC)    ED (erectile dysfunction)    Edema    Hyperlipidemia    Hypertension    Lumbar disc disease    Neuropathy    Stroke [facility])     Past Surgical History:  Procedure Laterality Date   BACK SURGERY     VASECTOMY      Family History  Problem Relation Age of Onset   Cancer Father        Prostate   Diabetes Neg Hx     Social History:  reports that he has never smoked. He has never used smokeless tobacco. He reports that he does not drink alcohol and does not use drugs.  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   Had been on AndroGel 1.62% pump   He is using 5 pumps daily with the following results  Testosterone level as follows:   Lab Results  Component Value Date   TESTOSTERONE 317.07 09/06/2020     HYPERTENSION: treated with  20-40 mg Benicar  His Benicar was reduced at some point  Since he was having edema on his last visit he was told to take isradipine 5 mg instead of amlodipine but with this along with extra Lasix his blood pressure decreased excessively and he did not take it anymore  He reports his blood pressure has been mostly about [id number] systolic recently  He has been to the nephrologist periodically  Currently taking Lasix 20 to 40 mg daily adjusting based on his level of swelling in his ankles  BP Readings from Last 3 Encounters:  03/14/21 (!) 150/60  12/10/20 (!) 150/62  09/09/20 (!) 155/60     His creatinine is variable, was 1.7 in December  No microalbuminuria   Lab Results  Component Value Date   CREATININE 1.47 03/10/2021   CREATININE 1.32 12/07/2020   CREATININE 1.47 09/06/2020     Hypercholesterolemia: Has been controlled on Lipitor 20 mg, has low HDL LDL 62 in August 22  Lab Results  Component Value Date   CHOL 115 06/07/2020   HDL 35.80 (L) 06/07/2020   LDLCALC 53 06/07/2020   LDLDIRECT 67.0 08/02/2015   TRIG 131.0 06/07/2020   CHOLHDL 3 06/07/2020    Has history of retinopathy, has regular follow-up with specialist  He had an episode of significant pain running around his abdomen for about a month and skin sensitivity but CT scan was normal, he was better with gabapentin No rash appeared after this episode    EXAM:   BP (!) 150/60    Pulse 72    Ht 5\' 7"  (  1.702 m)    Wt 297 lb 12.8 oz (135.1 kg)    SpO2 97%    BMI 46.64 kg/m      ASSESSMENT/PLAN:  DIABETES Type I and obesity:  See history of present illness for detailed discussion on current blood sugar patterns from data obtained from download of devices and problems identified  His A1c is 7.3 compared to 6.8  He has been using the T-slim pump  His recent management and blood sugar patterns are as above and discussed causes of hyperglycemia with him He thinks he has difficulty managing his  diet However he is not wanting to go back to Ozempic or even try Victoza which is better available  Other recommendations: Sleep mode was turned off in the office Reduce basal rate between 5 PM-8 PM down to 2.6 to eliminate hypoglycemia that occasionally occurs and causes rebound hyperglycemia INCREASE basal rate between 8 PM up to 2.6 through midnight He will try to work on cutting back on his carbohydrate and snack intake in the evenings  HYPOGONADISM: To have testosterone level checked again on the next visit   Mild CKD: Creatinine appears somewhat better than last time  Episode of radiculopathy in the abdomen area: 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  Flu vaccine given  Elayne Snare 03/14/2021 Q

## 2021-03-28 ENCOUNTER — Encounter: Payer: Self-pay | Admitting: Endocrinology

## 2021-03-30 ENCOUNTER — Other Ambulatory Visit: Payer: Self-pay | Admitting: Endocrinology

## 2021-03-30 ENCOUNTER — Telehealth: Payer: Self-pay

## 2021-03-30 NOTE — Telephone Encounter (Signed)
Pharmacy is requesting an alternate. Medication is on backorder.

## 2021-04-07 NOTE — Telephone Encounter (Signed)
Dr Dwyane Dee sent in diltiazem on 03/30/21

## 2021-05-20 ENCOUNTER — Other Ambulatory Visit: Payer: Self-pay | Admitting: Endocrinology

## 2021-05-28 ENCOUNTER — Other Ambulatory Visit: Payer: Self-pay | Admitting: Endocrinology

## 2021-06-07 ENCOUNTER — Other Ambulatory Visit (INDEPENDENT_AMBULATORY_CARE_PROVIDER_SITE_OTHER): Payer: Medicare PPO

## 2021-06-07 DIAGNOSIS — E291 Testicular hypofunction: Secondary | ICD-10-CM | POA: Diagnosis not present

## 2021-06-07 DIAGNOSIS — E7800 Pure hypercholesterolemia, unspecified: Secondary | ICD-10-CM | POA: Diagnosis not present

## 2021-06-07 DIAGNOSIS — E1065 Type 1 diabetes mellitus with hyperglycemia: Secondary | ICD-10-CM | POA: Diagnosis not present

## 2021-06-07 LAB — CBC
HCT: 40.3 % (ref 39.0–52.0)
Hemoglobin: 13.5 g/dL (ref 13.0–17.0)
MCHC: 33.5 g/dL (ref 30.0–36.0)
MCV: 90 fl (ref 78.0–100.0)
Platelets: 198 10*3/uL (ref 150.0–400.0)
RBC: 4.48 Mil/uL (ref 4.22–5.81)
RDW: 14 % (ref 11.5–15.5)
WBC: 5.7 10*3/uL (ref 4.0–10.5)

## 2021-06-07 LAB — BASIC METABOLIC PANEL
BUN: 41 mg/dL — ABNORMAL HIGH (ref 6–23)
CO2: 28 mEq/L (ref 19–32)
Calcium: 8.6 mg/dL (ref 8.4–10.5)
Chloride: 103 mEq/L (ref 96–112)
Creatinine, Ser: 1.57 mg/dL — ABNORMAL HIGH (ref 0.40–1.50)
GFR: 43.46 mL/min — ABNORMAL LOW (ref >60.00)
Glucose, Bld: 147 mg/dL — ABNORMAL HIGH (ref 70–99)
Potassium: 4.4 mEq/L (ref 3.5–5.1)
Sodium: 138 mEq/L (ref 135–145)

## 2021-06-07 LAB — LIPID PANEL
Cholesterol: 122 mg/dL (ref 0–200)
HDL: 35.5 mg/dL — ABNORMAL LOW (ref >39.00)
LDL Cholesterol: 50 mg/dL (ref 0–99)
NonHDL: 86.2
Total CHOL/HDL Ratio: 3
Triglycerides: 183 mg/dL — ABNORMAL HIGH (ref 0.0–149.0)
VLDL: 36.6 mg/dL (ref 0.0–40.0)

## 2021-06-07 LAB — HEMOGLOBIN A1C: Hgb A1c MFr Bld: 7.6 % — ABNORMAL HIGH (ref 4.6–6.5)

## 2021-06-07 LAB — TESTOSTERONE: Testosterone: 695.77 ng/dL (ref 300.00–890.00)

## 2021-06-09 ENCOUNTER — Encounter: Payer: Self-pay | Admitting: Endocrinology

## 2021-06-09 ENCOUNTER — Ambulatory Visit: Payer: Medicare PPO | Admitting: Endocrinology

## 2021-06-09 VITALS — BP 146/76 | HR 69 | Ht 67.0 in | Wt 298.8 lb

## 2021-06-09 DIAGNOSIS — N1831 Chronic kidney disease, stage 3a: Secondary | ICD-10-CM | POA: Diagnosis not present

## 2021-06-09 DIAGNOSIS — E1065 Type 1 diabetes mellitus with hyperglycemia: Secondary | ICD-10-CM

## 2021-06-09 DIAGNOSIS — E291 Testicular hypofunction: Secondary | ICD-10-CM | POA: Diagnosis not present

## 2021-06-09 DIAGNOSIS — I10 Essential (primary) hypertension: Secondary | ICD-10-CM

## 2021-06-09 NOTE — Progress Notes (Signed)
? ? ?Patient ID: Paul Adkins, male   DOB: 12-Jun-1945, 76 y.o.   MRN: [id number] ? ? ?Reason for visit:   follow-up ? ?Diagnosis: Type 1 diabetes, date of onset 1978 ? ?PAST history: He has had persistently poorly controlled diabetes for several years. A1c has been high with usual range 8.2 -9, overall improved since 2013. Compliance with glucose monitoring and diet has been variable and he does better when he is checking more sugars. His A1c was better than usual in 3/14 at 7.5 but he was also having significant hypoglycemia overnight and also occasionally later in the day. At that time he was started on Invokana 300 mg daily which helped him with glucose control and mild weight loss ? ?CURRENT insulin pump brand: T-slim ? ?PUMP SETTINGS are:  ? ?Basal rates: 2.8 from midnight- 4 AM. 4 AM = 2.75.  8 AM = 2.9.  8 PM = 3.2 ?Boluses : 1 unit for 4 g carbs at mealtimes  ?Glucose target 110-120, sensitivity 1: 20, with active insulin 5 hours.  ?Changing tubing every 1.9 days ?Recent insulin requirement average 149 units/day ? ? ?RECENT history:  ? ?His A1c is now 7.6 and gradually increasing ? ?Current glycemic patterns, management and problems: ? ?Glycemic patterns were reviewed from his pump download ? ?Interpretation of the Dexcom data for the last 2 weeks as follows: ?HIGHEST blood sugars on an average are about 8-9 PM and lowest 4-5 AM  ?HYPERGLYCEMIC episodes occur inconsistently midday and late evening after 8 PM but inconsistently ?POSTPRANDIAL readings are\on an average not rising excessively, highest after DINNER when the average is about 190 with moderate variability ?Blood sugars are only occasionally rising after lunch above target but otherwise well controlled ?OVERNIGHT blood sugars are within the target range, averaging about 155 at midnight and also tending to rise gradually after about 5-6 AM on most days  ?No hypoglycemia overnight although occasionally blood sugars may be low normal around 3  AM ? ?Insulin pump management: ?Not clear why his A1c has gone up since his recent blood sugars 1 time in range is improved and excellent  ?Previously was having high blood sugars late in the evening from getting more snacks and now this is a lot of problem  ?He still gets periodic hyperglycemia after some of his meals but usually not overnight ?He thinks that sometimes he has sugars from infusion site issues  ?He is trying to rotate his infusion sites but he still thinks he has significant scar tissue on his abdomen  ?As before not able to do much exercise  ?By mistake he appears to be using the exercise mode and this appears to be turned on at all times ?Bolus timing is usually fairly good even he thinks he is bolusing before starting to eat ?However sometimes not entering his carbohydrate intake and empirically bolusing as much as 18 units for meals ?Some of his postprandial sugars may be related to either high fat intake or extra snacks not adequately covered ?Currently about 75% of his boluses have been overridden and total BOLUS insulin is 55% of his daily insulin usage ? ? ?The last 2-week statistics: ? ?  ?CGM use % of time 98  ?2-week average/SD 155  ?Time in range      80%  ?% Time Above 180 17  ?% Time above 250 3  ?% Time Below 70 0  ? ?  ? ?PRE-MEAL  overnight  mornings  afternoon  evening Overall  ?Glucose  range:       ?Averages: 146 157 148  168   ? ?Previously: ?  ?CGM use % of time   ?2-week average/SD 163/47  ?Time in range 69     %  ?% Time Above 180 30  ?% Time above 250   ?% Time Below 70   ? ?  ? ?PRE-MEAL  overnight  mornings  afternoon  evening Overall  ?Glucose range:       ?Averages: 175 132 163 203   ? ? ?Wt Readings from Last 3 Encounters:  ?06/09/21 298 lb 12.8 oz (135.5 kg)  ?03/14/21 297 lb 12.8 oz (135.1 kg)  ?12/10/20 297 lb 3.2 oz (134.8 kg)  ? ? ?Lab Results  ?Component Value Date  ? HGBA1C 7.6 (H) 06/07/2021  ? HGBA1C 7.3 (H) 03/10/2021  ? HGBA1C 6.8 (H) 12/07/2020  ? ?Lab Results   ?Component Value Date  ? MICROALBUR 3.9 (H) 06/07/2020  ? [place] 50 06/07/2021  ? CREATININE 1.57 (H) 06/07/2021  ? ? ?OTHER problems addressed today are in review of systems ? ? ? ?Lab on 06/07/2021  ?Component Date Value Ref Range Status  ? Cholesterol 06/07/2021 122  0 - 200 mg/dL Final  ? ATP III Classification       Desirable:  < 200 mg/dL               Borderline High:  200 - 239 mg/dL          High:  > = 240 mg/dL  ? Triglycerides 06/07/2021 183.0 (H)  0.0 - 149.0 mg/dL Final  ? Normal:  <150 mg/dLBorderline High:  150 - 199 mg/dL  ? HDL 06/07/2021 35.50 (L)  >39.00 mg/dL Final  ? VLDL 06/07/2021 36.6  0.0 - 40.0 mg/dL Final  ? LDL Cholesterol 06/07/2021 50  0 - 99 mg/dL Final  ? Total CHOL/HDL Ratio 06/07/2021 3   Final  ?                Men          Women1/2 Average Risk     3.4          3.3Average Risk          5.0          4.42X Average Risk          9.6          7.13X Average Risk          15.0          11.0                      ? NonHDL 06/07/2021 86.20   Final  ? NOTE:  Non-HDL goal should be 30 mg/dL higher than patient's LDL goal (i.e. LDL goal of < 70 mg/dL, would have non-HDL goal of < 100 mg/dL)  ? WBC 06/07/2021 5.7  4.0 - 10.5 K/uL Final  ? RBC 06/07/2021 4.48  4.22 - 5.81 Mil/uL Final  ? Platelets 06/07/2021 198.0  150.0 - 400.0 K/uL Final  ? Hemoglobin 06/07/2021 13.5  13.0 - 17.0 g/dL Final  ? HCT 06/07/2021 40.3  39.0 - 52.0 % Final  ? MCV 06/07/2021 90.0  78.0 - 100.0 fl Final  ? MCHC 06/07/2021 33.5  30.0 - 36.0 g/dL Final  ? RDW 06/07/2021 14.0  11.5 - 15.5 % Final  ? Testosterone 06/07/2021 695.77  300.00 - 890.00 ng/dL Final  ? Sodium 06/07/2021 138  135 - 145 mEq/L Final  ? Potassium 06/07/2021 4.4  3.5 - 5.1 mEq/L Final  ? Chloride 06/07/2021 103  96 - 112 mEq/L Final  ? CO2 06/07/2021 28  19 - 32 mEq/L Final  ? Glucose, Bld 06/07/2021 147 (H)  70 - 99 mg/dL Final  ? BUN 06/07/2021 41 (H)  6 - 23 mg/dL Final  ? Creatinine, Ser 06/07/2021 1.57 (H)  0.40 - 1.50 mg/dL Final  ? GFR  06/07/2021 43.46 (L)  >60.00 mL/min Final  ? Calculated using the CKD-EPI Creatinine Equation (2021)  ? Calcium 06/07/2021 8.6  8.4 - 10.5 mg/dL Final  ? Hgb A1c MFr Bld 06/07/2021 7.6 (H)  4.6 - 6.5 % Final  ? Glycemic Control Guidelines for People with Diabetes:Non Diabetic:  <6%Goal of Therapy: <7%Additional Action Suggested:  >8%   ? ? ?Allergies as of 06/09/2021   ? ?   Reactions  ? Viagra [sildenafil Citrate]   ? ?  ? ?  ?Medication List  ?  ? ?  ? Accurate as of June 09, 2021  4:30 PM. If you have any questions, ask your nurse or doctor.  ?  ?  ? ?  ? ?STOP taking these medications   ? ?diltiazem 120 MG 24 hr capsule ?Commonly known as: CARDIZEM CD ?Stopped by: Elayne Snare, MD ?  ?Vitamin D 50 MCG (2000 UT) Caps ?Stopped by: Elayne Snare, MD ?  ? ?  ? ?TAKE these medications   ? ?aspirin 81 MG tablet ?Take 81 mg by mouth daily. ?  ?atorvastatin 20 MG tablet ?Commonly known as: LIPITOR ?TAKE 1 TABLET BY MOUTH EVERY DAY ?  ?furosemide 20 MG tablet ?Commonly known as: LASIX ?TAKE 1 TABLET BY MOUTH EVERY DAY ?  ?glucose blood test strip ?Commonly known as: Visual merchandiser Next Test ?Use as instructed to check blood sugars 4 times per day dx code E10.65 ?  ?insulin aspart 100 UNIT/ML injection ?Commonly known as: novoLOG ?USE MAX OF 170 UNITS UNDER THE SKIN DAILY VIA INSULIN PUMP. ?  ?methylphenidate 10 MG tablet ?Commonly known as: RITALIN ?Take 1 tablet up to twice a day ?  ?olmesartan 20 MG tablet ?Commonly known as: Benicar ?Take 1 tablet (20 mg total) by mouth daily. ?What changed: how much to take ?  ?PARoxetine 40 MG tablet ?Commonly known as: PAXIL ?TAKE 1 TABLET BY MOUTH EVERYDAY AT BEDTIME ?  ?Testosterone 20.25 MG/ACT (1.62%) Gel ?APPLY 2 PUMPS ON EACH UPPER ARM DAILY AFTER SHOWER ?  ? ?  ? ? ?Allergies:  ?Allergies  ?Allergen Reactions  ? Viagra [Sildenafil Citrate]   ? ? ?Past Medical History:  ?Diagnosis Date  ? Carpal tunnel syndrome   ? Depression   ? Diabetes mellitus without complication ([place])   ? ED  (erectile dysfunction)   ? Edema   ? Hyperlipidemia   ? Hypertension   ? Lumbar disc disease   ? Neuropathy   ? Stroke [facility])   ? ? ?Past Surgical History:  ?Procedure Laterality Date  ? BACK SURGERY    ? V

## 2021-06-09 NOTE — Patient Instructions (Signed)
Take 5 pumps daily of Androgel ?

## 2021-06-17 ENCOUNTER — Encounter: Payer: Self-pay | Admitting: Endocrinology

## 2021-06-17 ENCOUNTER — Other Ambulatory Visit: Payer: Self-pay | Admitting: Endocrinology

## 2021-06-20 ENCOUNTER — Other Ambulatory Visit: Payer: Self-pay | Admitting: Endocrinology

## 2021-06-20 ENCOUNTER — Encounter: Payer: Self-pay | Admitting: Endocrinology

## 2021-06-20 DIAGNOSIS — E559 Vitamin D deficiency, unspecified: Secondary | ICD-10-CM

## 2021-06-21 ENCOUNTER — Ambulatory Visit: Payer: Medicare PPO | Admitting: Family

## 2021-06-21 ENCOUNTER — Encounter: Payer: Self-pay | Admitting: Family

## 2021-06-21 VITALS — BP 136/64 | HR 74 | Temp 98.4°F | Resp 18 | Ht 67.0 in | Wt 297.4 lb

## 2021-06-21 DIAGNOSIS — I10 Essential (primary) hypertension: Secondary | ICD-10-CM

## 2021-06-21 DIAGNOSIS — N183 Chronic kidney disease, stage 3 unspecified: Secondary | ICD-10-CM

## 2021-06-21 DIAGNOSIS — E291 Testicular hypofunction: Secondary | ICD-10-CM | POA: Diagnosis not present

## 2021-06-21 DIAGNOSIS — Z23 Encounter for immunization: Secondary | ICD-10-CM

## 2021-06-21 DIAGNOSIS — F32A Depression, unspecified: Secondary | ICD-10-CM

## 2021-06-21 DIAGNOSIS — E108 Type 1 diabetes mellitus with unspecified complications: Secondary | ICD-10-CM

## 2021-06-21 DIAGNOSIS — E7800 Pure hypercholesterolemia, unspecified: Secondary | ICD-10-CM | POA: Diagnosis not present

## 2021-06-21 NOTE — Progress Notes (Signed)
?Paul Adkins is a 76 y.o. male with the following history as recorded in EpicCare:  ?Patient Active Problem List  ? Diagnosis Date Noted  ? Conjunctivitis 09/19/2018  ? Obstructive sleep apnea 08/26/2013  ? Type 1 diabetes mellitus with complications ([place]) 47/65/4650  ? Essential hypertension, benign 09/04/2012  ? Hypogonadism male 09/04/2012  ? Depression 09/04/2012  ? Pure hypercholesterolemia 09/04/2012  ? Severe obesity (BMI >= 40) ([place]) 09/04/2012  ?  ?Current Outpatient Medications  ?Medication Sig Dispense Refill  ? aspirin 81 MG tablet Take 81 mg by mouth daily.    ? atorvastatin (LIPITOR) 20 MG tablet TAKE 1 TABLET BY MOUTH EVERY DAY 90 tablet 1  ? furosemide (LASIX) 40 MG tablet Take by mouth.    ? glucose blood (BAYER CONTOUR NEXT TEST) test strip Use as instructed to check blood sugars 4 times per day dx code E10.65 150 each 5  ? insulin aspart (NOVOLOG) 100 UNIT/ML injection USE MAX OF 170 UNITS UNDER THE SKIN DAILY VIA INSULIN PUMP. 60 mL PRN  ? methylphenidate (RITALIN) 10 MG tablet Take 1 tablet up to twice a day 60 tablet 0  ? olmesartan (BENICAR) 20 MG tablet Take 1 tablet (20 mg total) by mouth daily. 30 tablet 3  ? PARoxetine (PAXIL) 40 MG tablet TAKE 1 TABLET BY MOUTH EVERYDAY AT BEDTIME 90 tablet 2  ? Polyethylene Glycol 3350 (MIRALAX PO) Take by mouth every other day.    ? Testosterone 20.25 MG/ACT (1.62%) GEL APPLY 2 PUMPS ON EACH UPPER ARM DAILY AFTER SHOWER 150 g 1  ? VITAMIN D, CHOLECALCIFEROL, PO Take 6,000 Int'l Units/day by mouth.    ? ?No current facility-administered medications for this visit.  ?  ?Allergies: Patient has no active allergies.  ?Past Medical History:  ?Diagnosis Date  ? Carpal tunnel syndrome   ? Chronic kidney disease   ? Depression   ? Diabetes mellitus without complication ([place])   ? ED (erectile dysfunction)   ? Edema   ? Hyperlipidemia   ? Hypertension   ? Lumbar disc disease   ? Neuropathy   ? Sleep apnea   ? Stroke [facility])   ?  ?Past Surgical History:   ?Procedure Laterality Date  ? BACK SURGERY    ? VASECTOMY    ?  ?Family History  ?Problem Relation Age of Onset  ? Osteoarthritis Mother   ? Cancer Father   ?     Prostate  ? Cancer Brother   ? Drug abuse Brother   ? Diabetes Neg Hx   ?  ?Social History  ? ?Tobacco Use  ? Smoking status: Never  ? Smokeless tobacco: Never  ?Substance Use Topics  ? Alcohol use: No  ?  ?Subjective:  ? ?Presents today as a new patient; history of Type 1 Diabetes, hypertension, hyperlipidemia, CKD; under care of endocrinology, nephrology; also works with dietician Antonieta Iba with [employer]);  ?Sees nephrology every 6 months;  ?Colonoscopy done in 11/2019; ?Ophthalmology every 6 months- does have documented retinopathy; 3 laser treatments in the past 5 years; ? 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 ?Objective:  ?Vitals:  ? 06/21/21 0856  ?BP: 136/64  ?Pulse: 74  ?Resp: 18  ?Temp: 98.4 ?F (36.9 ?C)  ?TempSrc: Oral  ?SpO2: 97%  ?Weight: 297 lb 6.4 oz (134.9 kg)  ?Height: '5\' 7"'$  (1.702 m)  ?  ?General: Well developed, well nourished, in no acute distress  ?Skin : Warm and  dry.  ?Head: Normocephalic and atraumatic  ?Lungs: Respirations unlabored; clear to auscultation bilaterally without wheeze, rales, rhonchi  ?CVS exam: normal rate and regular rhythm.  ?Neurologic: Alert and oriented; speech intact; face symmetrical; moves all extremities well; CNII-XII intact without focal deficit  ? ?Assessment:  ?1. Type 1 diabetes mellitus with complications ([place])   ?2. Need for pneumococcal 20-valent conjugate vaccination   ?3. Essential hypertension, benign   ?4. Pure hypercholesterolemia   ?5. Hypogonadism male   ?6. Stage 3 chronic kidney disease, unspecified whether stage 3a or 3b CKD ([place])   ?7. Depression, unspecified depression type   ?  ?Plan:  ?Continue working with endocrinology; ?Prevnar 20 updated today; ?Stable; continue same medications; ?Stable; continue same medication; ?Under care  of endocrinology;  ?Stable; continue same medication; seeing nephrology regularly; ?Stable; continue same medication;  ? ?Plan for CPE here in 6 months, sooner prn;  ? ?This visit occurred during the SARS-CoV-2 public health emergency.  Safety protocols were in place, including screening questions prior to the visit, additional usage of staff PPE, and extensive cleaning of exam room while observing appropriate contact time as indicated for disinfecting solutions.  ? ? ?Return in about 6 months (around 12/21/2021) for CPE.  ?Orders Placed This Encounter  ?Procedures  ? Pneumococcal conjugate vaccine 20-valent (Prevnar 20)  ?  ?Requested Prescriptions  ? ? No prescriptions requested or ordered in this encounter  ?  ? ?

## 2021-06-24 ENCOUNTER — Encounter: Payer: Self-pay | Admitting: Endocrinology

## 2021-07-12 ENCOUNTER — Telehealth: Payer: Self-pay

## 2021-07-12 ENCOUNTER — Other Ambulatory Visit (HOSPITAL_COMMUNITY): Payer: Self-pay

## 2021-07-12 NOTE — Telephone Encounter (Signed)
Patient Advocate Encounter ? ?Prior Authorization for Testosterone 20.25 MG/ACT(1.62%) gel has been approved.   ? ?PA# [id number] ?Effective dates: 02/27/2021 through 02/26/2022 ? ?Patients co-pay is $10.  ? ?

## 2021-07-12 NOTE — Telephone Encounter (Signed)
Patient Advocate Encounter ?  ?Received notification that prior authorization for Testosterone 20.25 MG/ACT(1.62%) gel is required. ?  ?PA submitted on 07/12/2021 ?Key [id number]  ?Status is pending ?

## 2021-08-29 ENCOUNTER — Encounter (INDEPENDENT_AMBULATORY_CARE_PROVIDER_SITE_OTHER): Payer: Medicare PPO | Admitting: Ophthalmology

## 2021-08-29 DIAGNOSIS — I10 Essential (primary) hypertension: Secondary | ICD-10-CM | POA: Diagnosis not present

## 2021-08-29 DIAGNOSIS — E113592 Type 2 diabetes mellitus with proliferative diabetic retinopathy without macular edema, left eye: Secondary | ICD-10-CM

## 2021-08-29 DIAGNOSIS — E113391 Type 2 diabetes mellitus with moderate nonproliferative diabetic retinopathy without macular edema, right eye: Secondary | ICD-10-CM | POA: Diagnosis not present

## 2021-08-29 DIAGNOSIS — H43813 Vitreous degeneration, bilateral: Secondary | ICD-10-CM

## 2021-08-29 DIAGNOSIS — D3132 Benign neoplasm of left choroid: Secondary | ICD-10-CM

## 2021-08-29 DIAGNOSIS — H35033 Hypertensive retinopathy, bilateral: Secondary | ICD-10-CM | POA: Diagnosis not present

## 2021-09-05 ENCOUNTER — Ambulatory Visit (INDEPENDENT_AMBULATORY_CARE_PROVIDER_SITE_OTHER): Payer: Medicare PPO

## 2021-09-05 VITALS — Ht 67.0 in | Wt 297.0 lb

## 2021-09-05 DIAGNOSIS — Z0000 Encounter for general adult medical examination without abnormal findings: Secondary | ICD-10-CM

## 2021-09-05 NOTE — Progress Notes (Signed)
Subjective:   Paul Adkins is a 76 y.o. male who presents for an Initial Medicare Annual Wellness Visit.  I connected with Paul Adkins today by telephone and verified that I am speaking with the correct person using two identifiers. Location patient: home Location provider: work Persons participating in the virtual visit: patient, Marine scientist.    I discussed the limitations, risks, security and privacy concerns of performing an evaluation and management service by telephone and the availability of in person appointments. I also discussed with the patient that there may be a patient responsible charge related to this service. The patient expressed understanding and verbally consented to this telephonic visit.    Interactive audio and video telecommunications were attempted between this provider and patient, however failed, due to patient having technical difficulties OR patient did not have access to video capability.  We continued and completed visit with audio only.  Some vital signs may be absent or patient reported.   Time Spent with patient on telephone encounter: 20 minutes   Review of Systems     Cardiac Risk Factors include: diabetes mellitus;advanced age (>62mn, >>9women);male gender;hypertension;dyslipidemia;obesity (BMI >30kg/m2);sedentary lifestyle     Objective:    Today's Vitals   09/05/21 0901  Weight: 297 lb (134.7 kg)  Height: '5\' 7"'$  (1.702 m)   Body mass index is 46.52 kg/m.     09/05/2021    9:05 AM 06/21/2021    9:09 AM 09/30/2020   10:56 AM 10/20/2015    1:34 PM  Advanced Directives  Does Patient Have a Medical Advance Directive? Yes Yes Yes No  Type of AParamed[place]of [place] will [place] will    Does patient want to make changes to medical advance directive?  No - Patient declined    Copy of [place]? No - copy requested No - copy requested      Current Medications  (verified) Outpatient Encounter Medications as of 09/05/2021  Medication Sig   aspirin 81 MG tablet Take 81 mg by mouth daily.   atorvastatin (LIPITOR) 20 MG tablet TAKE 1 TABLET BY MOUTH EVERY DAY   furosemide (LASIX) 40 MG tablet Take by mouth.   glucose blood (BAYER CONTOUR NEXT TEST) test strip Use as instructed to check blood sugars 4 times per day dx code E10.65   insulin aspart (NOVOLOG) 100 UNIT/ML injection USE MAX OF 170 UNITS UNDER THE SKIN DAILY VIA INSULIN PUMP.   methylphenidate (RITALIN) 10 MG tablet Take 1 tablet up to twice a day   olmesartan (BENICAR) 20 MG tablet Take 1 tablet (20 mg total) by mouth daily.   PARoxetine (PAXIL) 40 MG tablet TAKE 1 TABLET BY MOUTH EVERYDAY AT BEDTIME   Polyethylene Glycol 3350 (MIRALAX PO) Take by mouth every other day.   Testosterone 20.25 MG/ACT (1.62%) GEL APPLY 2 PUMPS ON EACH UPPER ARM DAILY AFTER SHOWER   VITAMIN D, CHOLECALCIFEROL, PO Take 6,000 Int'l Units/day by mouth.   No facility-administered encounter medications on file as of 09/05/2021.    Allergies (verified) Patient has no active allergies.   History: Past Medical History:  Diagnosis Date   Carpal tunnel syndrome    Chronic kidney disease    Depression    Diabetes mellitus without complication (HCC)    ED (erectile dysfunction)    Edema    Hyperlipidemia    Hypertension    Lumbar disc disease    Neuropathy    Sleep apnea  Stroke [facility])    Past Surgical History:  Procedure Laterality Date   BACK SURGERY     VASECTOMY     Family History  Problem Relation Age of Onset   Osteoarthritis Mother    Cancer Father        Prostate   Cancer Brother    Drug abuse Brother    Diabetes Neg Hx    Social History   Socioeconomic History   Marital status: Single    Spouse name: Not on file   Number of children: Not on file   Years of education: Not on file   Highest education level: Not on file  Occupational History   Not on file  Tobacco Use   Smoking  status: Never   Smokeless tobacco: Never  Substance and Sexual Activity   Alcohol use: No   Drug use: No   Sexual activity: Not on file  Other Topics Concern   Not on file  Social History Narrative   Not on file   Social Determinants of Health   Financial Resource Strain: Low Risk  (09/05/2021)   Overall Financial Resource Strain (CARDIA)    Difficulty of Paying Living Expenses: Not hard at all  Food Insecurity: No Food Insecurity (09/05/2021)   Hunger Vital Sign    Worried About Running Out of Food in the Last Year: Never true    [place] in the Last Year: Never true  Transportation Needs: No Transportation Needs (09/05/2021)   PRAPARE - Hydrologist (Medical): No    Lack of Transportation (Non-Medical): No  Physical Activity: Inactive (09/05/2021)   Exercise Vital Sign    Days of Exercise per Week: 0 days    Minutes of Exercise per Session: 0 min  Stress: No Stress Concern Present (09/05/2021)   [place]    Feeling of Stress : Not at all  Social Connections: Moderately Isolated (09/05/2021)   Social Connection and Isolation Panel [NHANES]    Frequency of Communication with Friends and Family: More than three times a week    Frequency of Social Gatherings with Friends and Family: Twice a week    Attends Religious Services: Never    Marine scientist or Organizations: Yes    Attends Music therapist: More than 4 times per year    Marital Status: Divorced    Tobacco Counseling Counseling given: Not Answered   Clinical Intake:  Pre-visit preparation completed: Yes  Pain : No/denies pain     BMI - recorded: 46.52 Nutritional Status: BMI > 30  Obese Nutritional Risks: None Diabetes: Yes CBG done?: No Did pt. bring in CBG monitor from home?: No (phone visit)  How often do you need to have someone help you when you read instructions, pamphlets, or  other written materials from your doctor or pharmacy?: 1 - Never Diabetes:  Is the patient diabetic?  Yes  If diabetic, was a CBG obtained today?  No  Did the patient bring in their glucometer from home?  No phone How often do you monitor your CBG's? Has an insulin pump.   Financial Strains and Diabetes Management:  Are you having any financial strains with the device, your supplies or your medication? No .  Does the patient want to be seen by Chronic Care Management for management of their diabetes?  No  Would the patient like to be referred to a Nutritionist or for Diabetic  Management?  No   Diabetic Exams:  Diabetic Eye Exam: Completed 08/2021. Awaiting notes  Diabetic Foot Exam: Pt has been advised about the importance in completing this exam. To be completed by PCP.    Interpreter Needed?: No  Information entered by :: Caroleen Hamman LPN   Activities of Daily Living    09/05/2021    9:08 AM  In your present state of health, do you have any difficulty performing the following activities:  Hearing? 1  Comment Hearing aids  Vision? 0  Difficulty concentrating or making decisions? 0  Walking or climbing stairs? 1  Comment stairs  Dressing or bathing? 0  Doing errands, shopping? 0  Preparing Food and eating ? N  Using the Toilet? N  In the past six months, have you accidently leaked urine? N  Do you have problems with loss of bowel control? N  Managing your Medications? N  Managing your Finances? N  Housekeeping or managing your Housekeeping? N    Patient Care Team: Marrian Salvage, FNP as PCP - General (Internal Medicine)  Indicate any recent Medical Services you may have received from other than [facility] providers in the past year (date may be approximate).     Assessment:   This is a routine wellness examination for Paul Adkins.  Hearing/Vision screen Hearing Screening - Comments:: Bilateral hearing aids Vision Screening - Comments:: Last eye exam-08/2021-Dr.  Zigmund Daniel  Dietary issues and exercise activities discussed: Current Exercise Habits: The patient does not participate in regular exercise at present, Exercise limited by: None identified   Goals Addressed             This Visit's Progress    Patient Stated       Would like to lose some weight & walk more       Depression Screen    09/05/2021    9:08 AM 09/30/2020   10:55 AM  PHQ 2/9 Scores  PHQ - 2 Score 0 0    Fall Risk    09/05/2021    9:06 AM 09/30/2020   10:55 AM  Fall Risk   Falls in the past year? 0 0  Number falls in past yr: 0   Injury with Fall? 0   Follow up Falls prevention discussed     [place]:  Any stairs in or around the home? No  Home free of loose throw rugs in walkways, pet beds, electrical cords, etc? Yes  Adequate lighting in your home to reduce risk of falls? Yes   ASSISTIVE DEVICES UTILIZED TO PREVENT FALLS:  Life alert? No  Use of a cane, walker or w/c? No  Grab bars in the bathroom? Yes  Shower chair or bench in shower? No  Elevated toilet seat or a handicapped toilet? No   TIMED UP AND GO:  Was the test performed? No . Phone visit   Cognitive Function:Normal cognitive status assessed by this Nurse Health Advisor. No abnormalities found.          Immunizations Immunization History  Administered Date(s) Administered   Fluad Quad(high Dose 65+) 12/17/2018, 12/10/2020   Influenza, High Dose Seasonal PF 11/25/2015, 12/14/2016, 12/17/2018, 12/13/2019, 12/10/2020   Influenza,inj,Quad PF,6+ Mos 12/05/2012, 11/26/2013, 11/11/2014, 11/02/2017   PFIZER(Purple Top)SARS-COV-2 Vaccination 04/10/2019, 05/05/2019, 12/15/2019   PNEUMOCOCCAL CONJUGATE-20 07/03/2020, 06/21/2021   Pfizer Covid-19 Vaccine Bivalent Booster 68yr & up 11/02/2020   Pneumococcal Polysaccharide-23 01/21/2020   Tdap 02/25/2014   Zoster Recombinat (Shingrix) 01/31/2019, 08/07/2019  TDAP status: Up to date  Flu Vaccine status:  Up to date  Pneumococcal vaccine status: Up to date  Covid-19 vaccine status: Completed vaccines  Qualifies for Shingles Vaccine? No   Zostavax completed No   Shingrix Completed?: Yes  Screening Tests Health Maintenance  Topic Date Due   Hepatitis C Screening  Never done   FOOT EXAM  04/28/2018   OPHTHALMOLOGY EXAM  08/05/2020   COVID-19 Vaccine (5 - Pfizer series) 03/04/2021   INFLUENZA VACCINE  09/27/2021   HEMOGLOBIN A1C  12/07/2021   TETANUS/TDAP  02/26/2024   COLONOSCOPY (Pts 45-67yr Insurance coverage will need to be confirmed)  12/04/2029   Pneumonia Vaccine 74 Years old  Completed   Zoster Vaccines- Shingrix  Completed   HPV VACCINES  Aged Out    Health Maintenance  Health Maintenance Due  Topic Date Due   Hepatitis C Screening  Never done   FOOT EXAM  04/28/2018   OPHTHALMOLOGY EXAM  08/05/2020   COVID-19 Vaccine (5 - Pfizer series) 03/04/2021    Colorectal cancer screening: Type of screening: Colonoscopy. Completed 12/05/2019. Repeat every 10 years  Lung Cancer Screening: (Low Dose CT Chest recommended if Age 74-80years, 30 pack-year currently smoking OR have quit w/in 15years.) does not qualify.     Additional Screening:  Hepatitis C Screening: does qualify; To be discussed with PCP at next visit  Vision Screening: Recommended annual ophthalmology exams for early detection of glaucoma and other disorders of the eye. Is the patient up to date with their annual eye exam?  Yes  Who is the provider or what is the name of the office in which the patient attends annual eye exams? Dr. MZigmund Daniel  Dental Screening: Recommended annual dental exams for proper oral hygiene  Community Resource Referral / Chronic Care Management: CRR required this visit?  No   CCM required this visit?  No      Plan:     I have personally reviewed and noted the following in the patient's chart:   Medical and social history Use of alcohol, tobacco or illicit drugs   Current medications and supplements including opioid prescriptions. Patient is not currently taking opioid prescriptions. Functional ability and status Nutritional status Physical activity Advanced directives List of other physicians Hospitalizations, surgeries, and ER visits in previous 12 months Vitals Screenings to include cognitive, depression, and falls Referrals and appointments  In addition, I have reviewed and discussed with patient certain preventive protocols, quality metrics, and best practice recommendations. A written personalized care plan for preventive services as well as general preventive health recommendations were provided to patient.   Due to this being a telephonic visit, the after visit summary with patients personalized plan was offered to patient via mail or my-chart. Patient would like to access on my-chart.  MMarta Antu LPN   [id number] Nurse Health Advisor  Nurse Notes: None

## 2021-09-05 NOTE — Patient Instructions (Signed)
Paul Adkins , Thank you for taking time to complete your Medicare Wellness Visit. I appreciate your ongoing commitment to your health goals. Please review the following plan we discussed and let me know if I can assist you in the future.   Screening recommendations/referrals: Colonoscopy: Completed 12/05/2019-No longer required. Recommended yearly ophthalmology/optometry visit for glaucoma screening and checkup Recommended yearly dental visit for hygiene and checkup  Vaccinations: Influenza vaccine: Up to date Pneumococcal vaccine: Up to date Tdap vaccine: Up to date Shingles vaccine: Completed vaccines   Covid-19: Up to date  Advanced directives: Please bring a copy of Living Will and/or Healthcare Power of Attorney for your chart.   Conditions/risks identified: See problem list  Next appointment: Follow up in one year for your annual wellness visit.   Preventive Care 69 Years and Older, Male Preventive care refers to lifestyle choices and visits with your health care provider that can promote health and wellness. What does preventive care include? A yearly physical exam. This is also called an annual well check. Dental exams once or twice a year. Routine eye exams. Ask your health care provider how often you should have your eyes checked. Personal lifestyle choices, including: Daily care of your teeth and gums. Regular physical activity. Eating a healthy diet. Avoiding tobacco and drug use. Limiting alcohol use. Practicing safe sex. Taking low doses of aspirin every day. Taking vitamin and mineral supplements as recommended by your health care provider. What happens during an annual well check? The services and screenings done by your health care provider during your annual well check will depend on your age, overall health, lifestyle risk factors, and family history of disease. Counseling  Your health care provider may ask you questions about your: Alcohol use. Tobacco  use. Drug use. Emotional well-being. Home and relationship well-being. Sexual activity. Eating habits. History of falls. Memory and ability to understand (cognition). Work and work Statistician. Screening  You may have the following tests or measurements: Height, weight, and BMI. Blood pressure. Lipid and cholesterol levels. These may be checked every 5 years, or more frequently if you are over 80 years old. Skin check. Lung cancer screening. You may have this screening every year starting at age 75 if you have a 30-pack-year history of smoking and currently smoke or have quit within the past 15 years. Fecal occult blood test (FOBT) of the stool. You may have this test every year starting at age 65. Flexible sigmoidoscopy or colonoscopy. You may have a sigmoidoscopy every 5 years or a colonoscopy every 10 years starting at age 72. Prostate cancer screening. Recommendations will vary depending on your family history and other risks. Hepatitis C blood test. Hepatitis B blood test. Sexually transmitted disease (STD) testing. Diabetes screening. This is done by checking your blood sugar (glucose) after you have not eaten for a while (fasting). You may have this done every 1-3 years. Abdominal aortic aneurysm (AAA) screening. You may need this if you are a current or former smoker. Osteoporosis. You may be screened starting at age 74 if you are at high risk. Talk with your health care provider about your test results, treatment options, and if necessary, the need for more tests. Vaccines  Your health care provider may recommend certain vaccines, such as: Influenza vaccine. This is recommended every year. Tetanus, diphtheria, and acellular pertussis (Tdap, Td) vaccine. You may need a Td booster every 10 years. Zoster vaccine. You may need this after age 32. Pneumococcal 13-valent conjugate (PCV13) vaccine. One dose  is recommended after age 74. Pneumococcal polysaccharide (PPSV23) vaccine.  One dose is recommended after age 60. Talk to your health care provider about which screenings and vaccines you need and how often you need them. This information is not intended to replace advice given to you by your health care provider. Make sure you discuss any questions you have with your health care provider. Document Released: 03/12/2015 Document Revised: 11/03/2015 Document Reviewed: 12/15/2014 Elsevier Interactive Patient Education  2017 [place] Prevention in the Home Falls can cause injuries. They can happen to people of all ages. There are many things you can do to make your home safe and to help prevent falls. What can I do on the outside of my home? Regularly fix the edges of walkways and driveways and fix any cracks. Remove anything that might make you trip as you walk through a door, such as a raised step or threshold. Trim any bushes or trees on the path to your home. Use bright outdoor lighting. Clear any walking paths of anything that might make someone trip, such as rocks or tools. Regularly check to see if handrails are loose or broken. Make sure that both sides of any steps have handrails. Any raised decks and porches should have guardrails on the edges. Have any leaves, snow, or ice cleared regularly. Use sand or salt on walking paths during winter. Clean up any spills in your garage right away. This includes oil or grease spills. What can I do in the bathroom? Use night lights. Install grab bars by the toilet and in the tub and shower. Do not use towel bars as grab bars. Use non-skid mats or decals in the tub or shower. If you need to sit down in the shower, use a plastic, non-slip stool. Keep the floor dry. Clean up any water that spills on the floor as soon as it happens. Remove soap buildup in the tub or shower regularly. Attach bath mats securely with double-sided non-slip rug tape. Do not have throw rugs and other things on the floor that can make  you trip. What can I do in the bedroom? Use night lights. Make sure that you have a light by your bed that is easy to reach. Do not use any sheets or blankets that are too big for your bed. They should not hang down onto the floor. Have a firm chair that has side arms. You can use this for support while you get dressed. Do not have throw rugs and other things on the floor that can make you trip. What can I do in the kitchen? Clean up any spills right away. Avoid walking on wet floors. Keep items that you use a lot in easy-to-reach places. If you need to reach something above you, use a strong step stool that has a grab bar. Keep electrical cords out of the way. Do not use floor polish or wax that makes floors slippery. If you must use wax, use non-skid floor wax. Do not have throw rugs and other things on the floor that can make you trip. What can I do with my stairs? Do not leave any items on the stairs. Make sure that there are handrails on both sides of the stairs and use them. Fix handrails that are broken or loose. Make sure that handrails are as long as the stairways. Check any carpeting to make sure that it is firmly attached to the stairs. Fix any carpet that is loose or worn. Avoid  having throw rugs at the top or bottom of the stairs. If you do have throw rugs, attach them to the floor with carpet tape. Make sure that you have a light switch at the top of the stairs and the bottom of the stairs. If you do not have them, ask someone to add them for you. What else can I do to help prevent falls? Wear shoes that: Do not have high heels. Have rubber bottoms. Are comfortable and fit you well. Are closed at the toe. Do not wear sandals. If you use a stepladder: Make sure that it is fully opened. Do not climb a closed stepladder. Make sure that both sides of the stepladder are locked into place. Ask someone to hold it for you, if possible. Clearly mark and make sure that you can  see: Any grab bars or handrails. First and last steps. Where the edge of each step is. Use tools that help you move around (mobility aids) if they are needed. These include: Canes. Walkers. Scooters. Crutches. Turn on the lights when you go into a dark area. Replace any light bulbs as soon as they burn out. Set up your furniture so you have a clear path. Avoid moving your furniture around. If any of your floors are uneven, fix them. If there are any pets around you, be aware of where they are. Review your medicines with your doctor. Some medicines can make you feel dizzy. This can increase your chance of falling. Ask your doctor what other things that you can do to help prevent falls. This information is not intended to replace advice given to you by your health care provider. Make sure you discuss any questions you have with your health care provider. Document Released: 12/10/2008 Document Revised: 07/22/2015 Document Reviewed: 03/20/2014 Elsevier Interactive Patient Education  2017 [employer].

## 2021-09-12 ENCOUNTER — Other Ambulatory Visit (INDEPENDENT_AMBULATORY_CARE_PROVIDER_SITE_OTHER): Payer: Medicare PPO

## 2021-09-12 DIAGNOSIS — E291 Testicular hypofunction: Secondary | ICD-10-CM

## 2021-09-12 DIAGNOSIS — E1065 Type 1 diabetes mellitus with hyperglycemia: Secondary | ICD-10-CM | POA: Diagnosis not present

## 2021-09-12 DIAGNOSIS — E559 Vitamin D deficiency, unspecified: Secondary | ICD-10-CM | POA: Diagnosis not present

## 2021-09-12 LAB — COMPREHENSIVE METABOLIC PANEL
ALT: 19 U/L (ref 0–53)
AST: 16 U/L (ref 0–37)
Albumin: 3.9 g/dL (ref 3.5–5.2)
Alkaline Phosphatase: 62 U/L (ref 39–117)
BUN: 45 mg/dL — ABNORMAL HIGH (ref 6–23)
CO2: 28 mEq/L (ref 19–32)
Calcium: 9 mg/dL (ref 8.4–10.5)
Chloride: 100 mEq/L (ref 96–112)
Creatinine, Ser: 1.9 mg/dL — ABNORMAL HIGH (ref 0.40–1.50)
GFR: 34.5 mL/min — ABNORMAL LOW (ref >60.00)
Glucose, Bld: 120 mg/dL — ABNORMAL HIGH (ref 70–99)
Potassium: 4.5 mEq/L (ref 3.5–5.1)
Sodium: 141 mEq/L (ref 135–145)
Total Bilirubin: 0.4 mg/dL (ref 0.2–1.2)
Total Protein: 6.1 g/dL (ref 6.0–8.3)

## 2021-09-12 LAB — MICROALBUMIN / CREATININE URINE RATIO
Creatinine,U: 112.7 mg/dL
Microalb Creat Ratio: 0.6 mg/g (ref 0.0–30.0)
Microalb, Ur: <0.7 mg/dL (ref 0.0–1.9)

## 2021-09-12 LAB — VITAMIN D 25 HYDROXY (VIT D DEFICIENCY, FRACTURES): VITD: 59.13 ng/mL (ref 30.00–100.00)

## 2021-09-12 LAB — TESTOSTERONE: Testosterone: 294.59 ng/dL — ABNORMAL LOW (ref 300.00–890.00)

## 2021-09-12 LAB — HEMOGLOBIN A1C: Hgb A1c MFr Bld: 7.5 % — ABNORMAL HIGH (ref 4.6–6.5)

## 2021-09-15 ENCOUNTER — Encounter: Payer: Self-pay | Admitting: Endocrinology

## 2021-09-15 ENCOUNTER — Ambulatory Visit: Payer: Medicare PPO | Admitting: Endocrinology

## 2021-09-15 VITALS — BP 138/66 | HR 81 | Ht 67.0 in | Wt 296.8 lb

## 2021-09-15 DIAGNOSIS — E291 Testicular hypofunction: Secondary | ICD-10-CM

## 2021-09-15 DIAGNOSIS — I10 Essential (primary) hypertension: Secondary | ICD-10-CM

## 2021-09-15 DIAGNOSIS — E1165 Type 2 diabetes mellitus with hyperglycemia: Secondary | ICD-10-CM | POA: Diagnosis not present

## 2021-09-15 DIAGNOSIS — Z794 Long term (current) use of insulin: Secondary | ICD-10-CM | POA: Diagnosis not present

## 2021-09-15 DIAGNOSIS — R42 Dizziness and giddiness: Secondary | ICD-10-CM | POA: Diagnosis not present

## 2021-09-15 NOTE — Progress Notes (Unsigned)
Patient ID: Paul Adkins, male   DOB: 06-08-1945, 76 y.o.   MRN: [id number]   Reason for visit:   follow-up  Diagnosis: Type 1 diabetes, date of onset 1978  PAST history: He has had persistently poorly controlled diabetes for several years. A1c has been high with usual range 8.2 -9, overall improved since 2013. Compliance with glucose monitoring and diet has been variable and he does better when he is checking more sugars. His A1c was better than usual in 3/14 at 7.5 but he was also having significant hypoglycemia overnight and also occasionally later in the day. At that time he was started on Invokana 300 mg daily which helped him with glucose control and mild weight loss  CURRENT insulin pump brand: T-slim  PUMP SETTINGS are:   Basal rates: 2.8 from midnight- 4 AM. 4 AM = 2.75.  8 AM = 2.9.  8 PM = 3.2 Boluses : 1 unit for 4 g carbs at mealtimes  Glucose target 110-120, sensitivity 1: 20, with active insulin 5 hours.  Changing tubing every 1.9 days Recent insulin requirement average 149 units/day   RECENT history:   His A1c is now 7.6 and gradually increasing  Current glycemic patterns, management and problems:  Glycemic patterns were reviewed from his pump download  Interpretation of the Dexcom data for the last 2 weeks as follows: HIGHEST blood sugars on an average are about 8-9 PM and lowest 4-5 AM  HYPERGLYCEMIC episodes occur inconsistently midday and late evening after 8 PM but inconsistently POSTPRANDIAL readings are\on an average not rising excessively, highest after DINNER when the average is about 190 with moderate variability Blood sugars are only occasionally rising after lunch above target but otherwise well controlled OVERNIGHT blood sugars are within the target range, averaging about 155 at midnight and also tending to rise gradually after about 5-6 AM on most days  No hypoglycemia overnight although occasionally blood sugars may be low normal around 3  AM  Insulin pump management: Not clear why his A1c has gone up since his recent blood sugars 1 time in range is improved and excellent  Previously was having high blood sugars late in the evening from getting more snacks and now this is a lot of problem  He still gets periodic hyperglycemia after some of his meals but usually not overnight He thinks that sometimes he has sugars from infusion site issues  He is trying to rotate his infusion sites but he still thinks he has significant scar tissue on his abdomen  As before not able to do much exercise  By mistake he appears to be using the exercise mode and this appears to be turned on at all times Bolus timing is usually fairly good even he thinks he is bolusing before starting to eat However sometimes not entering his carbohydrate intake and empirically bolusing as much as 18 units for meals Some of his postprandial sugars may be related to either high fat intake or extra snacks not adequately covered Currently about 75% of his boluses have been overridden and total BOLUS insulin is 55% of his daily insulin usage   The last 2-week statistics:    CGM use % of time 98  2-week average/SD 49  Time in range     84   %  % Time Above 180 16  % Time above 250   % Time Below 70       PRE-MEAL  overnight  mornings  afternoon  evening Overall  Glucose range:       Averages: 141 145 151 157    Previously:   CGM use % of time 98  2-week average/SD 155  Time in range      80%  % Time Above 180 17  % Time above 250 3  % Time Below 70 0      PRE-MEAL  overnight  mornings  afternoon  evening Overall  Glucose range:       Averages: 146 157 148  168      Wt Readings from Last 3 Encounters:  09/15/21 296 lb 12.8 oz (134.6 kg)  09/05/21 297 lb (134.7 kg)  06/21/21 297 lb 6.4 oz (134.9 kg)    Lab Results  Component Value Date   HGBA1C 7.5 (H) 09/12/2021   HGBA1C 7.6 (H) 06/07/2021   HGBA1C 7.3 (H) 03/10/2021   Lab Results   Component Value Date   MICROALBUR <0.7 09/12/2021   LDLCALC 50 06/07/2021   CREATININE 1.90 (H) 09/12/2021    OTHER problems addressed today are in review of systems    Lab on 09/12/2021  Component Date Value Ref Range Status   VITD 09/12/2021 59.13  30.00 - 100.00 ng/mL Final   Testosterone 09/12/2021 294.59 (L)  300.00 - 890.00 ng/dL Final   Microalb, Ur 09/12/2021 <0.7  0.0 - 1.9 mg/dL Final   Creatinine,U 09/12/2021 112.7  mg/dL Final   Microalb Creat Ratio 09/12/2021 0.6  0.0 - 30.0 mg/g Final   Sodium 09/12/2021 141  135 - 145 mEq/L Final   Potassium 09/12/2021 4.5  3.5 - 5.1 mEq/L Final   Chloride 09/12/2021 100  96 - 112 mEq/L Final   CO2 09/12/2021 28  19 - 32 mEq/L Final   Glucose, Bld 09/12/2021 120 (H)  70 - 99 mg/dL Final   BUN 09/12/2021 45 (H)  6 - 23 mg/dL Final   Creatinine, Ser 09/12/2021 1.90 (H)  0.40 - 1.50 mg/dL Final   Total Bilirubin 09/12/2021 0.4  0.2 - 1.2 mg/dL Final   Alkaline Phosphatase 09/12/2021 62  39 - 117 U/L Final   AST 09/12/2021 16  0 - 37 U/L Final   ALT 09/12/2021 19  0 - 53 U/L Final   Total Protein 09/12/2021 6.1  6.0 - 8.3 g/dL Final   Albumin 09/12/2021 3.9  3.5 - 5.2 g/dL Final   GFR 09/12/2021 34.50 (L)  >60.00 mL/min Final   Calculated using the CKD-EPI Creatinine Equation (2021)   Calcium 09/12/2021 9.0  8.4 - 10.5 mg/dL Final   Hgb A1c MFr Bld 09/12/2021 7.5 (H)  4.6 - 6.5 % Final   Glycemic Control Guidelines for People with Diabetes:Non Diabetic:  <6%Goal of Therapy: <7%Additional Action Suggested:  >8%     Allergies as of 09/15/2021   No Known Allergies      Medication List        Accurate as of September 15, 2021  1:52 PM. If you have any questions, ask your nurse or doctor.          aspirin 81 MG tablet Take 81 mg by mouth daily.   atorvastatin 20 MG tablet Commonly known as: LIPITOR TAKE 1 TABLET BY MOUTH EVERY DAY   furosemide 40 MG tablet Commonly known as: LASIX Take by mouth.   glucose blood test  strip Commonly known as: Visual merchandiser Next Test Use as instructed to check blood sugars 4 times per day dx code E10.65   insulin aspart 100 UNIT/ML injection Commonly known  as: novoLOG USE MAX OF 170 UNITS UNDER THE SKIN DAILY VIA INSULIN PUMP.   methylphenidate 10 MG tablet Commonly known as: RITALIN Take 1 tablet up to twice a day   MIRALAX PO Take by mouth every other day.   olmesartan 20 MG tablet Commonly known as: Benicar Take 1 tablet (20 mg total) by mouth daily.   PARoxetine 40 MG tablet Commonly known as: PAXIL TAKE 1 TABLET BY MOUTH EVERYDAY AT BEDTIME   Testosterone 20.25 MG/ACT (1.62%) Gel APPLY 2 PUMPS ON EACH UPPER ARM DAILY AFTER SHOWER   VITAMIN D (CHOLECALCIFEROL) PO Take 6,000 Int'l Units/day by mouth.        Allergies:  No Known Allergies   Past Medical History:  Diagnosis Date   Carpal tunnel syndrome    Chronic kidney disease    Depression    Diabetes mellitus without complication (HCC)    ED (erectile dysfunction)    Edema    Hyperlipidemia    Hypertension    Lumbar disc disease    Neuropathy    Sleep apnea    Stroke [facility])     Past Surgical History:  Procedure Laterality Date   BACK SURGERY     VASECTOMY      Family History  Problem Relation Age of Onset   Osteoarthritis Mother    Cancer Father        Prostate   Cancer Brother    Drug abuse Brother    Diabetes Neg Hx     Social History:  reports that he has never smoked. He has never used smokeless tobacco. He reports that he does not drink alcohol and does not use drugs.  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   Had been on AndroGel 1.62% pump   He is using 6 pumps daily, although he was asked to reduce his dose to 5 pumps previously because of a high level he has continued using 6 pumps recently Does not miss any doses He does complain of fatigue  Testosterone level as follows:   Lab Results  Component Value Date   TESTOSTERONE 294.59 (L) 09/12/2021     HYPERTENSION: treated with 20 mg Benicar  Since he was having edema on his last visit he was told to take isradipine 5 mg instead of amlodipine but with this along with extra Lasix his blood pressure decreased excessively and he did not take it anymore  He reports his blood pressure has been mostly about 132 systolic recently  He has been to the nephrologist periodically  Currently taking Lasix 80 mg daily as prescribed by his nephrologist  BP Readings from Last 3 Encounters:  09/15/21 138/66  06/21/21 136/64  06/09/21 (!) 146/76     His creatinine is variable and recently higher  No microalbuminuria   Lab Results  Component Value Date   CREATININE 1.90 (H) 09/12/2021   CREATININE 1.57 (H) 06/07/2021   CREATININE 1.47 03/10/2021     Hypercholesterolemia: Has been controlled on  Lipitor 20 mg, has low HDL LDL 62 in August 22  Lab Results  Component Value Date   CHOL 122 06/07/2021   HDL 35.50 (L) 06/07/2021   LDLCALC 50 06/07/2021   LDLDIRECT 67.0 08/02/2015   TRIG 183.0 (H) 06/07/2021   CHOLHDL 3 06/07/2021    Has history of retinopathy, has regular follow-up with specialist  DIZZINESS: He says he feels some lightheadedness transiently when he first stands up or when getting out of bed.  At times this may be also a feeling of swimmy headedness.  No headaches Has not discussed this with his PCP  EXAM:   BP 138/66 (BP Location: Left Arm, Patient Position: Sitting, Cuff  Size: Normal)   Pulse 81   Ht '5\' 7"'$  (1.702 m)   Wt 296 lb 12.8 oz (134.6 kg)   SpO2 98%   BMI 46.49 kg/m   Standing blood pressure 130/70   ASSESSMENT/PLAN:  DIABETES Type I and obesity:  See history of present illness for detailed discussion on current blood sugar patterns from data obtained from download of devices and problems identified  His A1c is 7.5 compared to 7.6  He has been using the T-slim pump using control IQ consistently  His A1c is relatively higher than recent blood sugars Also has excellent time in range 84% He does have periodic postprandial hyperglycemia especially after lunch or dinner but not consistently This is related to variability in his diet and possibly from not bolusing before starting to eat consistently  He says that recently with an eating less snacks also he may be doing better on weight is not down   For now we will continue the same regimen, encouraged him to bolus 10 to 15 minutes before starting to eat and also additional boluses if blood sugars are higher from any high fat meals  HYPOGONADISM: Testosterone level is surprisingly low even with good compliance and not even reducing his dose from 6 down to 5 pumps as directed Since he is not completely absorbing his testosterone gel and giving variable results he can be given a trial of Gaynelle Cage which is likely to be more consistently absorbed Discussed how this is different, timing of taking the medication and dosage adjustment He will start with 198 mg twice daily, may need prior authorization  To have testosterone level checked again on the next visit   Mild CKD: Creatinine is fluctuating, may recently be higher from taking more Lasix  Dizziness and fatigue: Does not appear to be having orthostatic hypotension and needs to follow-up with PCP  HYPERTENSION: Blood pressure is better controlled  Needs to follow-up with nephrologist    There are no Patient Instructions on file for this  visit.   Elayne Snare 09/15/2021

## 2021-09-16 MED ORDER — JATENZO 198 MG PO CAPS: ORAL_CAPSULE | ORAL | 3 refills | Status: DC

## 2021-09-21 ENCOUNTER — Encounter: Payer: Self-pay | Admitting: Endocrinology

## 2021-09-26 ENCOUNTER — Telehealth: Payer: Self-pay

## 2021-09-26 NOTE — Telephone Encounter (Signed)
Insurance requesting PA to be done on Jatenzo '198mg'$  capsule. Please start PA

## 2021-09-27 ENCOUNTER — Other Ambulatory Visit (HOSPITAL_COMMUNITY): Payer: Self-pay

## 2021-09-27 ENCOUNTER — Telehealth: Payer: Self-pay | Admitting: Pharmacy Technician

## 2021-09-27 NOTE — Telephone Encounter (Signed)
Patient Advocate Encounter   Received notification from Office that prior authorization for Paul Adkins is required/requested.   PA submitted on 09/27/21 to [facility] via [place]  PA Case ID: [id number] Status is pending  Pharmacy Patient Advocate Fax:  [phone]

## 2021-09-27 NOTE — Telephone Encounter (Signed)
Patient Advocate Encounter  Prior Authorization for [employer] '198mg'$  has been approved.    PA# PA Case ID: [id number] Effective dates: 02/27/21 through 02/26/22  Per Test Claim Patients co-pay is $100.

## 2021-09-28 ENCOUNTER — Other Ambulatory Visit: Payer: Self-pay | Admitting: Endocrinology

## 2021-09-28 NOTE — Telephone Encounter (Signed)
Patient called back and he would like to try and will pick up from pharmacy.

## 2021-10-05 ENCOUNTER — Other Ambulatory Visit: Payer: Self-pay | Admitting: Endocrinology

## 2021-10-11 ENCOUNTER — Other Ambulatory Visit: Payer: Self-pay

## 2021-10-11 MED ORDER — INSULIN ASPART 100 UNIT/ML IJ SOLN: INTRAMUSCULAR | 99 refills | Status: DC

## 2021-10-27 ENCOUNTER — Other Ambulatory Visit (INDEPENDENT_AMBULATORY_CARE_PROVIDER_SITE_OTHER): Payer: Medicare PPO

## 2021-10-27 DIAGNOSIS — E291 Testicular hypofunction: Secondary | ICD-10-CM | POA: Diagnosis not present

## 2021-10-27 LAB — TESTOSTERONE: Testosterone: 423.37 ng/dL (ref 300.00–890.00)

## 2021-11-04 ENCOUNTER — Other Ambulatory Visit: Payer: Self-pay | Admitting: Endocrinology

## 2021-12-15 ENCOUNTER — Other Ambulatory Visit: Payer: Self-pay | Admitting: *Deleted

## 2021-12-15 DIAGNOSIS — I872 Venous insufficiency (chronic) (peripheral): Secondary | ICD-10-CM

## 2021-12-20 ENCOUNTER — Other Ambulatory Visit (INDEPENDENT_AMBULATORY_CARE_PROVIDER_SITE_OTHER): Payer: Medicare PPO

## 2021-12-20 ENCOUNTER — Other Ambulatory Visit: Payer: Self-pay | Admitting: Endocrinology

## 2021-12-20 DIAGNOSIS — E1065 Type 1 diabetes mellitus with hyperglycemia: Secondary | ICD-10-CM | POA: Diagnosis not present

## 2021-12-20 DIAGNOSIS — E291 Testicular hypofunction: Secondary | ICD-10-CM | POA: Diagnosis not present

## 2021-12-20 DIAGNOSIS — E7800 Pure hypercholesterolemia, unspecified: Secondary | ICD-10-CM

## 2021-12-20 LAB — COMPREHENSIVE METABOLIC PANEL
ALT: 25 U/L (ref 0–53)
AST: 21 U/L (ref 0–37)
Albumin: 3.7 g/dL (ref 3.5–5.2)
Alkaline Phosphatase: 50 U/L (ref 39–117)
BUN: 31 mg/dL — ABNORMAL HIGH (ref 6–23)
CO2: 29 mEq/L (ref 19–32)
Calcium: 9 mg/dL (ref 8.4–10.5)
Chloride: 106 mEq/L (ref 96–112)
Creatinine, Ser: 1.66 mg/dL — ABNORMAL HIGH (ref 0.40–1.50)
GFR: 40.49 mL/min — ABNORMAL LOW (ref >60.00)
Glucose, Bld: 121 mg/dL — ABNORMAL HIGH (ref 70–99)
Potassium: 4.5 mEq/L (ref 3.5–5.1)
Sodium: 140 mEq/L (ref 135–145)
Total Bilirubin: 0.3 mg/dL (ref 0.2–1.2)
Total Protein: 6.2 g/dL (ref 6.0–8.3)

## 2021-12-20 LAB — LIPID PANEL
Cholesterol: 98 mg/dL (ref 0–200)
HDL: 32.2 mg/dL — ABNORMAL LOW (ref >39.00)
LDL Cholesterol: 39 mg/dL (ref 0–99)
NonHDL: 65.32
Total CHOL/HDL Ratio: 3
Triglycerides: 134 mg/dL (ref 0.0–149.0)
VLDL: 26.8 mg/dL (ref 0.0–40.0)

## 2021-12-20 LAB — TESTOSTERONE: Testosterone: 260.64 ng/dL — ABNORMAL LOW (ref 300.00–890.00)

## 2021-12-20 LAB — CBC
HCT: 39.6 % (ref 39.0–52.0)
Hemoglobin: 13.3 g/dL (ref 13.0–17.0)
MCHC: 33.5 g/dL (ref 30.0–36.0)
MCV: 90.4 fl (ref 78.0–100.0)
Platelets: 217 10*3/uL (ref 150.0–400.0)
RBC: 4.38 Mil/uL (ref 4.22–5.81)
RDW: 14.2 % (ref 11.5–15.5)
WBC: 7.6 10*3/uL (ref 4.0–10.5)

## 2021-12-20 LAB — HEMOGLOBIN A1C: Hgb A1c MFr Bld: 7.3 % — ABNORMAL HIGH (ref 4.6–6.5)

## 2021-12-22 ENCOUNTER — Encounter: Payer: Self-pay | Admitting: Family

## 2021-12-22 ENCOUNTER — Ambulatory Visit (INDEPENDENT_AMBULATORY_CARE_PROVIDER_SITE_OTHER): Payer: Medicare PPO | Admitting: Family

## 2021-12-22 VITALS — BP 168/60 | HR 68 | Temp 97.9°F | Ht 67.0 in | Wt 307.4 lb

## 2021-12-22 DIAGNOSIS — M79604 Pain in right leg: Secondary | ICD-10-CM

## 2021-12-22 DIAGNOSIS — Z23 Encounter for immunization: Secondary | ICD-10-CM | POA: Diagnosis not present

## 2021-12-22 DIAGNOSIS — Z0001 Encounter for general adult medical examination with abnormal findings: Secondary | ICD-10-CM | POA: Diagnosis not present

## 2021-12-22 DIAGNOSIS — Z0000 Encounter for general adult medical examination without abnormal findings: Secondary | ICD-10-CM

## 2021-12-22 MED ORDER — FUROSEMIDE 40 MG PO TABS: ORAL_TABLET | ORAL | Status: DC

## 2021-12-22 NOTE — Patient Instructions (Signed)
Please let the nephrologist know that your blood pressure has not been controlled since she made the changes to your regimen;

## 2021-12-22 NOTE — Progress Notes (Signed)
Paul Adkins is a 76 y.o. male with the following history as recorded in EpicCare:  Patient Active Problem List   Diagnosis Date Noted   Conjunctivitis 09/19/2018   Obstructive sleep apnea 08/26/2013   Type 1 diabetes mellitus with complications ([place]) 54/65/6812   Essential hypertension, benign 09/04/2012   Hypogonadism male 09/04/2012   Depression 09/04/2012   Pure hypercholesterolemia 09/04/2012   Severe obesity (BMI >= 40) (HCC) 09/04/2012    Current Outpatient Medications  Medication Sig Dispense Refill   atorvastatin (LIPITOR) 20 MG tablet TAKE 1 TABLET BY MOUTH EVERY DAY 90 tablet 1   glucose blood (BAYER CONTOUR NEXT TEST) test strip Use as instructed to check blood sugars 4 times per day dx code E10.65 150 each 5   insulin aspart (NOVOLOG) 100 UNIT/ML injection USE MAX OF 170 UNITS UNDER THE SKIN DAILY VIA INSULIN PUMP. E11.65 60 mL PRN   methylphenidate (RITALIN) 10 MG tablet Take 1 tablet up to twice a day 60 tablet 0   olmesartan (BENICAR) 20 MG tablet Take 1 tablet (20 mg total) by mouth daily. (Patient taking differently: Take 40 mg by mouth daily.) 30 tablet 3   PARoxetine (PAXIL) 40 MG tablet TAKE 1 TABLET BY MOUTH EVERYDAY AT BEDTIME 90 tablet 2   Polyethylene Glycol 3350 (MIRALAX PO) Take by mouth every other day.     Testosterone Undecanoate (JATENZO) 198 MG CAPS 1 tab with Bfst and 1 with dinner 60 capsule 3   VITAMIN D, CHOLECALCIFEROL, PO Take 6,000 Int'l Units/day by mouth.     furosemide (LASIX) 40 MG tablet Take 2 per day 60 tablet    No current facility-administered medications for this visit.    Allergies: Patient has no known allergies.  Past Medical History:  Diagnosis Date   Carpal tunnel syndrome    Chronic kidney disease    Depression    Diabetes mellitus without complication (HCC)    ED (erectile dysfunction)    Edema    Hyperlipidemia    Hypertension    Lumbar disc disease    Neuropathy    Sleep apnea    Stroke [facility])     Past Surgical  History:  Procedure Laterality Date   BACK SURGERY     VASECTOMY      Family History  Problem Relation Age of Onset   Osteoarthritis Mother    Cancer Father        Prostate   Cancer Brother    Drug abuse Brother    Diabetes Neg Hx     Social History   Tobacco Use   Smoking status: Never   Smokeless tobacco: Never  Substance Use Topics   Alcohol use: No    Subjective: Patient presents for yearly CPE; majority of care is managed by specialists including endocrinology and nephrologist;  Takes Paxil 40 mg daily; may take Ritalin 1-2 x per year- these prescriptions are written by endocrinologist and he does see psychologist;  Has been having right hamstring pain x 3 months- limited movement as a result;  Scheduled to see endocrinology tomorrow;  Increased fatigue- already wears CPAP;  Did not take Lasix today- only on Benicar 40 mg daily;   Review of Systems  Constitutional:  Positive for malaise/fatigue.  HENT: Negative.    Eyes: Negative.   Respiratory: Negative.    Cardiovascular: Negative.   Gastrointestinal: Negative.   Genitourinary: Negative.   Musculoskeletal: Negative.   Skin: Negative.   Neurological: Negative.   Endo/Heme/Allergies: Negative.   Psychiatric/Behavioral: Negative.  Objective:  Vitals:   12/22/21 0906  BP: (!) 170/60  Pulse: 68  Temp: 97.9 F (36.6 C)  TempSrc: Oral  SpO2: 98%  Weight: (!) 307 lb 6.4 oz (139.4 kg)  Height: '5\' 7"'$  (1.702 m)    General: Well developed, well nourished, in no acute distress  Skin : Warm and dry.  Head: Normocephalic and atraumatic  Eyes: Sclera and conjunctiva clear; pupils round and reactive to light; extraocular movements intact  Ears: External normal; canals clear; tympanic membranes normal  Oropharynx: Pink, supple. No suspicious lesions  Neck: Supple without thyromegaly, adenopathy  Lungs: Respirations unlabored; clear to auscultation bilaterally without wheeze, rales, rhonchi  CVS exam: normal  rate and regular rhythm.  Abdomen: Soft; nontender; nondistended; normoactive bowel sounds; no masses or hepatosplenomegaly  Musculoskeletal: No deformities; no active joint inflammation  Extremities: No edema, cyanosis, clubbing  Vessels: Symmetric bilaterally  Neurologic: Alert and oriented; speech intact; face symmetrical; moves all extremities well; CNII-XII intact without focal deficit   Assessment:  1. PE (physical exam), annual   2. 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  Orders Placed This Encounter  Procedures   Ambulatory referral to Physical Therapy    Referral Priority:   Routine    Referral Type:   Physical Medicine    Referral Reason:   Specialty Services Required    Requested Specialty:   Physical Therapy    Number of Visits Requested:   1    Requested Prescriptions   Signed Prescriptions Disp Refills   furosemide (LASIX) 40 MG tablet 60 tablet     Sig: Take 2 per day

## 2021-12-23 ENCOUNTER — Encounter: Payer: Self-pay | Admitting: Endocrinology

## 2021-12-23 ENCOUNTER — Ambulatory Visit: Payer: Medicare PPO | Admitting: Endocrinology

## 2021-12-23 VITALS — BP 164/60 | HR 74 | Ht 67.0 in | Wt 307.4 lb

## 2021-12-23 DIAGNOSIS — N1831 Chronic kidney disease, stage 3a: Secondary | ICD-10-CM | POA: Diagnosis not present

## 2021-12-23 DIAGNOSIS — E291 Testicular hypofunction: Secondary | ICD-10-CM | POA: Diagnosis not present

## 2021-12-23 DIAGNOSIS — E1065 Type 1 diabetes mellitus with hyperglycemia: Secondary | ICD-10-CM

## 2021-12-23 NOTE — Progress Notes (Unsigned)
Patient ID: Paul Adkins, male   DOB: 1945-04-14, 76 y.o.   MRN: [id number]   Reason for visit:   follow-up  Diagnosis: Type 1 diabetes, date of onset 1978  PAST history: He has had persistently poorly controlled diabetes for several years. A1c has been high with usual range 8.2 -9, overall improved since 2013. Compliance with glucose monitoring and diet has been variable and he does better when he is checking more sugars. His A1c was better than usual in 3/14 at 7.5 but he was also having significant hypoglycemia overnight and also occasionally later in the day. At that time he was started on Invokana 300 mg daily which helped him with glucose control and mild weight loss  CURRENT insulin pump brand: T-slim  PUMP SETTINGS are:   Basal rates: 2.8 from midnight- 4 AM. 4 AM = 2.75.  8 AM = 2.9.  8 PM = 3.2 Boluses : 1 unit for 4 g carbs at mealtimes  Glucose target 110-120, sensitivity 1: 20, with active insulin 5 hours.  Changing tubing every 1.9 days Recent insulin requirement average 149 units/day   RECENT history:   His A1c is 7.5 and not much changed Recent CGM average 149  Current glycemic patterns, management and problems:  Glycemic patterns were reviewed from his T-Slim pump download  Interpretation of the Dexcom data for the last 2 weeks as follows: HIGHEST blood sugars on an average are as before in the evenings but occasionally much higher in the late afternoons  AVERAGE blood sugar at any given time is within the target range of 70-180 Also by segment his average blood sugar is between about 140-160 at all times   HYPERGLYCEMIC episodes occur periodically late afternoon and after about 7 PM and occasionally after breakfast also  Only rarely blood sugars are over 250 POSTPRANDIAL readings are\on an average relatively well controlled but periodically going above the target after lunch and dinner and occasionally after breakfast This may be sometimes related to  late boluses  OVERNIGHT blood sugars are generally very stable with only occasional higher readings early part of the night  No hypoglycemia overnight, occasionally low normal readings are on 5 AM-6 AM   Insulin pump management: He says that he is tending to snack less lately as he is more occupied with taking care of a new puppy  However still not doing much exercise  Some of his boluses are late as discussed above and may cause high readings but occasionally will have inadequate boluses also  However blood sugars are generally very stable with standard deviation only 36  Still has significant difficulty losing weight   CGM 2-week statistics:     CGM use % of time   2-week average/SD 151  Time in range      77  %  % Time Above 180   % Time above 250   % Time Below 70       PRE-MEAL  overnight  mornings  afternoon  evening Overall  Glucose range:       Averages:            CGM use % of time 98  2-week average/SD 149/36  Time in range     84   %  % Time Above 180 16  % Time above 250   % Time Below 70       PRE-MEAL  overnight  mornings  afternoon  evening Overall  Glucose range:  Averages: 141 145 151 157    Previously:   CGM use % of time 98  2-week average/SD 155  Time in range      80%  % Time Above 180 17  % Time above 250 3  % Time Below 70 0      PRE-MEAL  overnight  mornings  afternoon  evening Overall  Glucose range:       Averages: 146 157 148  168      Wt Readings from Last 3 Encounters:  12/23/21 (!) 307 lb 6.4 oz (139.4 kg)  12/22/21 (!) 307 lb 6.4 oz (139.4 kg)  09/15/21 296 lb 12.8 oz (134.6 kg)    Lab Results  Component Value Date   HGBA1C 7.3 (H) 12/20/2021   HGBA1C 7.5 (H) 09/12/2021   HGBA1C 7.6 (H) 06/07/2021   Lab Results  Component Value Date   MICROALBUR <0.7 09/12/2021   LDLCALC 39 12/20/2021   CREATININE 1.66 (H) 12/20/2021    OTHER problems addressed today are in review of systems    Lab on 12/20/2021   Component Date Value Ref Range Status   WBC 12/20/2021 7.6  4.0 - 10.5 K/uL Final   RBC 12/20/2021 4.38  4.22 - 5.81 Mil/uL Final   Platelets 12/20/2021 217.0  150.0 - 400.0 K/uL Final   Hemoglobin 12/20/2021 13.3  13.0 - 17.0 g/dL Final   HCT 12/20/2021 39.6  39.0 - 52.0 % Final   MCV 12/20/2021 90.4  78.0 - 100.0 fl Final   MCHC 12/20/2021 33.5  30.0 - 36.0 g/dL Final   RDW 12/20/2021 14.2  11.5 - 15.5 % Final   Testosterone 12/20/2021 260.64 (L)  300.00 - 890.00 ng/dL Final   Cholesterol 12/20/2021 98  0 - 200 mg/dL Final   ATP III Classification       Desirable:  < 200 mg/dL               Borderline High:  200 - 239 mg/dL          High:  > = 240 mg/dL   Triglycerides 12/20/2021 134.0  0.0 - 149.0 mg/dL Final   Normal:  <150 mg/dLBorderline High:  150 - 199 mg/dL   HDL 12/20/2021 32.20 (L)  >39.00 mg/dL Final   VLDL 12/20/2021 26.8  0.0 - 40.0 mg/dL Final   LDL Cholesterol 12/20/2021 39  0 - 99 mg/dL Final   Total CHOL/HDL Ratio 12/20/2021 3   Final                  Men          Women1/2 Average Risk     3.4          3.3Average Risk          5.0          4.42X Average Risk          9.6          7.13X Average Risk          15.0          11.0                       NonHDL 12/20/2021 65.32   Final   NOTE:  Non-HDL goal should be 30 mg/dL higher than patient's LDL goal (i.e. LDL goal of < 70 mg/dL, would have non-HDL goal of < 100 mg/dL)   Sodium 12/20/2021 140  135 - 145 mEq/L  Final   Potassium 12/20/2021 4.5  3.5 - 5.1 mEq/L Final   Chloride 12/20/2021 106  96 - 112 mEq/L Final   CO2 12/20/2021 29  19 - 32 mEq/L Final   Glucose, Bld 12/20/2021 121 (H)  70 - 99 mg/dL Final   BUN 12/20/2021 31 (H)  6 - 23 mg/dL Final   Creatinine, Ser 12/20/2021 1.66 (H)  0.40 - 1.50 mg/dL Final   Total Bilirubin 12/20/2021 0.3  0.2 - 1.2 mg/dL Final   Alkaline Phosphatase 12/20/2021 50  39 - 117 U/L Final   AST 12/20/2021 21  0 - 37 U/L Final   ALT 12/20/2021 25  0 - 53 U/L Final   Total Protein  12/20/2021 6.2  6.0 - 8.3 g/dL Final   Albumin 12/20/2021 3.7  3.5 - 5.2 g/dL Final   GFR 12/20/2021 40.49 (L)  >60.00 mL/min Final   Calculated using the CKD-EPI Creatinine Equation (2021)   Calcium 12/20/2021 9.0  8.4 - 10.5 mg/dL Final   Hgb A1c MFr Bld 12/20/2021 7.3 (H)  4.6 - 6.5 % Final   Glycemic Control Guidelines for People with Diabetes:Non Diabetic:  <6%Goal of Therapy: <7%Additional Action Suggested:  >8%     Allergies as of 12/23/2021   No Known Allergies      Medication List        Accurate as of December 23, 2021  9:51 AM. If you have any questions, ask your nurse or doctor.          atorvastatin 20 MG tablet Commonly known as: LIPITOR TAKE 1 TABLET BY MOUTH EVERY DAY   furosemide 40 MG tablet Commonly known as: LASIX Take 2 per day   glucose blood test strip Commonly known as: Visual merchandiser Next Test Use as instructed to check blood sugars 4 times per day dx code E10.65   insulin aspart 100 UNIT/ML injection Commonly known as: NovoLOG USE MAX OF 170 UNITS UNDER THE SKIN DAILY VIA INSULIN PUMP. E11.65   Jatenzo 198 MG Caps Generic drug: Testosterone Undecanoate 1 tab with Bfst and 1 with dinner   methylphenidate 10 MG tablet Commonly known as: RITALIN Take 1 tablet up to twice a day   MIRALAX PO Take by mouth every other day.   olmesartan 20 MG tablet Commonly known as: Benicar Take 1 tablet (20 mg total) by mouth daily.   olmesartan 40 MG tablet Commonly known as: BENICAR Take 40 mg by mouth daily.   PARoxetine 40 MG tablet Commonly known as: PAXIL TAKE 1 TABLET BY MOUTH EVERYDAY AT BEDTIME   VITAMIN D (CHOLECALCIFEROL) PO Take 6,000 Int'l Units/day by mouth.        Allergies:  No Known Allergies   Past Medical History:  Diagnosis Date   Carpal tunnel syndrome    Chronic kidney disease    Depression    Diabetes mellitus without complication (HCC)    ED (erectile dysfunction)    Edema    Hyperlipidemia    Hypertension     Lumbar disc disease    Neuropathy    Sleep apnea    Stroke [facility])     Past Surgical History:  Procedure Laterality Date   BACK SURGERY     VASECTOMY      Family History  Problem Relation Age of Onset   Osteoarthritis Mother    Cancer Father        Prostate   Cancer Brother    Drug abuse Brother    Diabetes Neg Hx  Social History:  reports that he has never smoked. He has never used smokeless tobacco. He reports that he does not drink alcohol and does not use drugs.  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   Had been on AndroGel 1.62% pump   Does not miss any doses He does not complain of fatigue  Testosterone level as follows:   Lab Results  Component Value Date   TESTOSTERONE 260.64 (L) 12/20/2021     HYPERTENSION: treated with 40 mg Benicar  Since he was having edema on his last visit he was told to take isradipine 5 mg instead of amlodipine but with this along with extra Lasix his blood pressure decreased excessively and he did not take it anymore  He reports his blood pressure has been mostly about 400 systolic recently  He has been to the nephrologist periodically  Currently taking Lasix 80 mg daily as prescribed by his nephrologist   BP Readings from Last 3 Encounters:  12/23/21 (!) 164/60  12/22/21 (!) 168/60  09/15/21 138/66     His creatinine is variable and recently higher  No microalbuminuria   Lab Results  Component Value Date    CREATININE 1.66 (H) 12/20/2021   CREATININE 1.90 (H) 09/12/2021   CREATININE 1.57 (H) 06/07/2021     Hypercholesterolemia: Has been controlled on Lipitor 20 mg, has low HDL LDL 62 in August 22  Lab Results  Component Value Date   CHOL 98 12/20/2021   HDL 32.20 (L) 12/20/2021   LDLCALC 39 12/20/2021   LDLDIRECT 67.0 08/02/2015   TRIG 134.0 12/20/2021   CHOLHDL 3 12/20/2021    Has history of retinopathy, has regular follow-up with specialist Dr  Zigmund Daniel   EXAM:   BP (!) 164/60   Pulse 74   Ht '5\' 7"'$  (1.702 m)   Wt (!) 307 lb 6.4 oz (139.4 kg)   SpO2 97%   BMI 48.15 kg/m     ASSESSMENT/PLAN:  DIABETES Type I and obesity:  See history of present illness for detailed discussion on current blood sugar patterns from data obtained from download of devices and problems identified  His A1c is 7.5 compared to 7.6  He has been using the T-slim pump using control IQ consistently  His A1c is relatively higher than recent blood sugars Also has excellent time in range 84% He does have periodic postprandial hyperglycemia especially after lunch or dinner but not consistently This is related to variability in his diet and possibly from not bolusing before starting to eat consistently  He says that recently with an eating less snacks also he may be doing better on weight is not down   For now we will continue the same regimen, encouraged him to bolus 10 to 15 minutes before starting to eat and also additional boluses if blood sugars are higher from any high fat meals  HYPOGONADISM: Testosterone level is surprisingly low even with good compliance and not even reducing his dose from 6 down to 5  pumps as directed Since he is not completely absorbing his testosterone gel and giving variable results he can be given a trial of Gaynelle Cage which is likely to be more consistently absorbed Discussed how this is different, timing of taking the medication and dosage adjustment He will start with  198 mg twice daily, may need prior authorization  To have testosterone level checked again on the next visit   Mild CKD: Creatinine is fluctuating, may recently be higher from taking more Lasix  Dizziness and fatigue:  Does not appear to be having orthostatic hypotension and needs to follow-up with PCP  HYPERTENSION: Blood pressure is better controlled  Needs to follow-up with nephrologist    There are no Patient Instructions on file for this visit.   Elayne Snare 12/23/2021

## 2021-12-23 NOTE — Patient Instructions (Signed)
Ask about Control IQ boluses  ? Need more diuretic

## 2021-12-26 MED ORDER — JATENZO 237 MG PO CAPS: ORAL_CAPSULE | ORAL | 3 refills | Status: DC

## 2021-12-26 NOTE — Progress Notes (Unsigned)
[facility]           [place]  History and Physical   Paul Adkins is a 76 y.o. male who presents with BLE swelling.    He states that he has had leg swelling for a long time (can't remember when it started).  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 Hollie Salk.  He is on Lasix but he is not really sure it is working.  He does not have hx of DVT or varicose veins.  He does not have family hx of varicose veins or leg swelling.  He has worn compression on trip [place].  He does not really notice any difference with the swelling after he has been in bed all night.  He states that he pulled his right hamstring a couple of months ago and has had leg pain from that.  He does not endorse any cramping with walking.  He does not have any skin color changes to his lower legs. He denies any hx of heart failure.    He has hx of poorly controlled DM on insulin pump and is followed by endocrinology.  He also has hx of CVA, depression, ADD, HTN, HLD.  He was evaluated by cardiology in 2022 and had normal EF and negative nuclear scan.    The pt is on a statin for cholesterol management.  The pt is not on a daily aspirin.   Other AC:  none The pt is on diuretic, ARB for hypertension.   The pt is diabetic.   Tobacco hx:  never  Pt does not have family hx of AAA.  Past Medical History:  Diagnosis Date   Carpal tunnel syndrome    Chronic kidney disease    Depression    Diabetes mellitus without complication (HCC)    ED (erectile dysfunction)    Edema    Hyperlipidemia    Hypertension    Lumbar disc disease    Neuropathy    Sleep apnea    Stroke [facility])     Past Surgical History:  Procedure Laterality Date   BACK SURGERY     VASECTOMY      Social History   Socioeconomic History   Marital status: Single    Spouse name: Not on file   Number of children: Not on file   Years of education: Not on file   Highest education level: Not on file  Occupational  History   Not on file  Tobacco Use   Smoking status: Never   Smokeless tobacco: Never  Substance and Sexual Activity   Alcohol use: No   Drug use: No   Sexual activity: Not on file  Other Topics Concern   Not on file  Social History Narrative   Not on file   Social Determinants of Health   Financial Resource Strain: Low Risk  (09/05/2021)   Overall Financial Resource Strain (CARDIA)    Difficulty of Paying Living Expenses: Not hard at all  Food Insecurity: No Food Insecurity (09/05/2021)   Hunger Vital Sign    Worried About Running Out of Food in the Last Year: Never true    Ran Out of Food in the Last Year: Never true  Transportation Needs: No Transportation Needs (09/05/2021)   PRAPARE - Hydrologist (Medical): No    Lack of Transportation (Non-Medical): No  Physical Activity: Inactive (09/05/2021)   Exercise Vital Sign  Days of Exercise per Week: 0 days    Minutes of Exercise per Session: 0 min  Stress: No Stress Concern Present (09/05/2021)   [place]    Feeling of Stress : Not at all  Social Connections: Moderately Isolated (09/05/2021)   Social Connection and Isolation Panel [NHANES]    Frequency of Communication with Friends and Family: More than three times a week    Frequency of Social Gatherings with Friends and Family: Twice a week    Attends Religious Services: Never    Marine scientist or Organizations: Yes    Attends Music therapist: More than 4 times per year    Marital Status: Divorced  Intimate Partner Violence: Not At Risk (09/05/2021)   Humiliation, Afraid, Rape, and Kick questionnaire    Fear of Current or Ex-Partner: No    Emotionally Abused: No    Physically Abused: No    Sexually Abused: No     Family History  Problem Relation Age of Onset   Osteoarthritis Mother    Cancer Father        Prostate   Cancer Brother    Drug abuse  Brother    Diabetes Neg Hx     Current Outpatient Medications  Medication Sig Dispense Refill   atorvastatin (LIPITOR) 20 MG tablet TAKE 1 TABLET BY MOUTH EVERY DAY 90 tablet 1   furosemide (LASIX) 40 MG tablet Take 2 per day 60 tablet    glucose blood (BAYER CONTOUR NEXT TEST) test strip Use as instructed to check blood sugars 4 times per day dx code E10.65 150 each 5   insulin aspart (NOVOLOG) 100 UNIT/ML injection USE MAX OF 170 UNITS UNDER THE SKIN DAILY VIA INSULIN PUMP. E11.65 60 mL PRN   methylphenidate (RITALIN) 10 MG tablet Take 1 tablet up to twice a day 60 tablet 0   olmesartan (BENICAR) 20 MG tablet Take 1 tablet (20 mg total) by mouth daily. (Patient not taking: Reported on 12/23/2021) 30 tablet 3   olmesartan (BENICAR) 40 MG tablet Take 40 mg by mouth daily.     PARoxetine (PAXIL) 40 MG tablet TAKE 1 TABLET BY MOUTH EVERYDAY AT BEDTIME 90 tablet 2   Polyethylene Glycol 3350 (MIRALAX PO) Take by mouth every other day.     Testosterone Undecanoate (JATENZO) 237 MG CAPS 1 capsule with breakfast and 1 with dinner 60 capsule 3   VITAMIN D, CHOLECALCIFEROL, PO Take 6,000 Int'l Units/day by mouth.     No current facility-administered medications for this visit.    No Known Allergies  REVIEW OF SYSTEMS:   '[X]'$  denotes positive finding, '[ ]'$  denotes negative finding Cardiac  Comments:  Chest pain or chest pressure:    Shortness of breath upon exertion: x   Short of breath when lying flat:    Irregular heart rhythm:        Vascular    Pain in calf, thigh, or hip brought on by ambulation: x See HPI  Pain in feet at night that wakes you up from your sleep:     Blood clot in your veins:    Leg swelling:  x       Pulmonary    Oxygen at home:    Productive cough:     Wheezing:         Neurologic    Sudden weakness in arms or legs:     Sudden numbness in arms or legs:  Sudden onset of difficulty speaking or slurred speech:    Temporary loss of vision in one eye:      Problems with dizziness:         Gastrointestinal    Blood in stool:     Vomited blood:         Genitourinary    Burning when urinating:     Blood in urine:        Psychiatric    Major depression:         Hematologic    Bleeding problems:    Problems with blood clotting too easily:        Skin    Rashes or ulcers:        Constitutional    Fever or chills:      PHYSICAL EXAMINATION:  Today's Vitals   12/27/21 0856  BP: (!) 173/68  Pulse: 68  Resp: 20  Temp: 98.4 F (36.9 C)  TempSrc: Temporal  SpO2: 97%  Weight: (!) 305 lb 4.8 oz (138.5 kg)  Height: '5\' 7"'$  (1.702 m)  PainSc: 5    Body mass index is 47.82 kg/m.   General:  WDWN in NAD; vital signs documented above Gait: Not observed HENT: WNL, normocephalic Pulmonary: normal non-labored breathing without wheezing Cardiac: regular HR; without carotid bruits Abdomen: soft/obese, NT, aortic pulse is not palpable Skin: without rashes Vascular Exam/Pulses:  Right Left  Radial 2+ (normal) 2+ (normal)  DP 2+ normal 2+ normal   Extremities: BLE swelling; no ischemic changes; no non healing wounds; no skin color changes  Neurologic: A&O X 3;  moving all extremities equally Psychiatric:  The pt has Normal affect.   Non-Invasive Vascular Imaging:   Venous duplex on 12/27/2021: +--------------+---------+------+-----------+------------+--------+  RIGHT         Reflux NoRefluxReflux TimeDiameter cmsComments                          Yes                                   +--------------+---------+------+-----------+------------+--------+  CFV                     yes   >1 second                       +--------------+---------+------+-----------+------------+--------+  FV prox       no                                              +--------------+---------+------+-----------+------------+--------+  FV mid        no                                               +--------------+---------+------+-----------+------------+--------+  FV dist       no                                              +--------------+---------+------+-----------+------------+--------+  Popliteal     no                                              +--------------+---------+------+-----------+------------+--------+  GSV at [facility]              yes    >500 ms      0.78              +--------------+---------+------+-----------+------------+--------+  GSV prox thigh          yes    >500 ms      0.38              +--------------+---------+------+-----------+------------+--------+  GSV mid thigh           yes    >500 ms      0.43    branches  +--------------+---------+------+-----------+------------+--------+  GSV dist thigh          yes    >500 ms      0.21              +--------------+---------+------+-----------+------------+--------+  GSV at knee             yes    >500 ms      0.31    branches  +--------------+---------+------+-----------+------------+--------+  GSV prox calf           yes    >500 ms      0.25              +--------------+---------+------+-----------+------------+--------+  SSV Pop Fossa           yes    >500 ms      0.21              +--------------+---------+------+-----------+------------+--------+  SSV prox calf           yes    >500 ms      0.16              +--------------+---------+------+-----------+------------+--------+   Summary:  Right:  - No evidence of deep vein thrombosis seen in the right lower extremity, from the common femoral through the popliteal veins.  - No evidence of superficial venous thrombosis in the right lower extremity.     - Venous reflux is noted in the right common femoral vein.  - Venous reflux is noted in the right sapheno-femoral junction.  - Venous reflux is noted in the right greater saphenous vein in the thigh.  - Venous reflux is noted in the right  greater saphenous vein in the calf.  - Venous reflux is noted in the right short saphenous vein.     Paul Adkins is a 76 y.o. male who presents with: BLE swelling with hx of CKD IIIb   -pt does have CKD.  He is on a diuretic but he is not sure it is working for his swelling.  He does not have hx of heart failure.  -pt has palpable DP pedal pulses bilaterally -pt does not have evidence of DVT.  Pt does have venous reflux in the right CFV as well as the GSV at the [facility] and throughout the GSV and the SSV.  Unfortunately, he is not a candidate for laser ablation as the diameter of the vein is not amendable for the procedure.   -discussed with pt about wearing knee high 15-20 mmHg compression stockings and pt was measured for these today.   Discussed putting them on before getting out of bed and taking them off at night.   -discussed the importance of leg elevation and how to elevate properly - pt is advised to elevate their legs and a diagram is given to them to demonstrate for pt to  lay flat on their back with knees elevated and slightly bent with their feet higher than their knees, which puts their feet higher than their heart for 15 minutes per day.  If pt cannot lay flat, advised to lay as flat as possible.  -pt is advised to continue as much walking as possible and avoid sitting or standing for long periods of time.  -discussed importance of weight loss and exercise and that water aerobics would also be beneficial.  He is not interested in weight loss surgery. -handout with recommendations given -pt will f/u as needed.    Leontine Locket, [facility] [phone]  Clinic MD:  Carlis Abbott

## 2021-12-27 ENCOUNTER — Encounter: Payer: Self-pay | Admitting: Endocrinology

## 2021-12-27 ENCOUNTER — Ambulatory Visit (HOSPITAL_COMMUNITY)
Admission: RE | Admit: 2021-12-27 | Discharge: 2021-12-27 | Disposition: A | Discharge status: Home / Self Care | Payer: Medicare PPO | Source: Ambulatory Visit | Attending: Vascular Surgery | Admitting: Vascular Surgery

## 2021-12-27 ENCOUNTER — Ambulatory Visit: Payer: Medicare PPO | Admitting: Physician Assistant

## 2021-12-27 VITALS — BP 173/68 | HR 68 | Temp 98.4°F | Resp 20 | Ht 67.0 in | Wt 305.3 lb

## 2021-12-27 DIAGNOSIS — I872 Venous insufficiency (chronic) (peripheral): Secondary | ICD-10-CM | POA: Insufficient documentation

## 2021-12-27 DIAGNOSIS — M7989 Other specified soft tissue disorders: Secondary | ICD-10-CM | POA: Diagnosis not present

## 2021-12-29 ENCOUNTER — Encounter: Payer: Self-pay | Admitting: Family

## 2022-01-10 NOTE — Therapy (Signed)
OUTPATIENT PHYSICAL THERAPY LOWER EXTREMITY EVALUATION   Patient Name: Paul Adkins MRN: [id number] DOB:[birth year], 76 y.o., male Today's Date: 01/11/2022   PT End of Session - 01/11/22 0853     Visit Number 1    Number of Visits 12    Date for PT Re-Evaluation 02/22/22    Authorization Type [employer] MCR    Authorization Time Period 01/11/22-02/22/22    Authorization - Visit Number 1    Authorization - Number of Visits 12    Progress Note Due on Visit 10    PT Start Time 0853    PT Stop Time 0944    PT Time Calculation (min) 51 min    Activity Tolerance Patient tolerated treatment well    Behavior During Therapy [facility] for tasks assessed/performed             Past Medical History:  Diagnosis Date   Carpal tunnel syndrome    Chronic kidney disease    Depression    Diabetes mellitus without complication (HCC)    ED (erectile dysfunction)    Edema    Hyperlipidemia    Hypertension    Lumbar disc disease    Neuropathy    Sleep apnea    Stroke [facility])    Past Surgical History:  Procedure Laterality Date   BACK SURGERY     VASECTOMY     Patient Active Problem List   Diagnosis Date Noted   Conjunctivitis 09/19/2018   Obstructive sleep apnea 08/26/2013   Type 1 diabetes mellitus with complications (HCC) 09/04/2012   Essential hypertension, benign 09/04/2012   Hypogonadism male 09/04/2012   Depression 09/04/2012   Pure hypercholesterolemia 09/04/2012   Severe obesity (BMI >= 40) (HCC) 09/04/2012    PCP: Olive Bass, FNP   REFERRING PROVIDER: Olive Bass, FNP   REFERRING DIAG: [id number] (ICD-10-CM) - Right leg pain   THERAPY DIAG:  Pain in right leg  Pain in left leg  Other abnormalities of gait and mobility  Cramp and spasm  Rationale for Evaluation and Treatment: Rehabilitation  ONSET DATE: 3-4 months ago  SUBJECTIVE:   SUBJECTIVE STATEMENT: I'm limited with walking (like halfway through grocery store) His hips start to hurt  and take little steps.Marland Kitchen 3-4 months ago was kicking off his pants and his right leg spasmed. He continues to get cramps when bending both knees. He walks very carefully. Has not been on any stairs since this happened. (More a cardio problem). Standing limited to a few minutes. Walking limited to 1 minute.   PERTINENT HISTORY: CKD, DM, HTN, depression, neuropathy abdomen and thighs, lumbar fusion, CVA, severe obesity PAIN:  Are you having pain? Yes: NPRS scale: 5/10 Pain location: hips and legs Pain description: ache and muscles don't want to move anymore Aggravating factors: standing and walking Relieving factors: sitting  PRECAUTIONS: None  WEIGHT BEARING RESTRICTIONS: No  FALLS:  Has patient fallen in last 6 months? 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 No DVT, thrombosis.  PATIENT SURVEYS:  LEFS 17 / 80 = 21.3 %  COGNITION: Overall cognitive status: Within functional limits for tasks assessed     SENSATION: Light touch: Impaired  and bil outer  thighs  EDEMA: Reports significant edema; hasn't been determined why.   MUSCLE LENGTH: XB:JYNWGN less than 45 deg bil Quads:mod tightness bil ITB: NT Piriformis:mod tightness Hip Flexors:NT Heelcords: WNL   POSTURE: No Significant postural limitations  PALPATION: Palpation: TTP at post R knee and HS. Increased tissue tension in right HS  LUMBAR ROM:   Active  A/PROM  eval  Flexion Limited by tight HS (hands to knees)  Extension WNL some pain in low back  Right lateral flexion full  Left lateral flexion full  Right rotation   Left rotation    (Blank rows = not tested)    LOWER EXTREMITY  MMT:   ROM Right eval Left eval  Hip flexion 4+ 4-  Hip extension 4+ 4+  Hip abduction 5 4+  Hip adduction 5 5  Hip internal rotation    Hip external rotation    Knee flexion 4+ (mild pain behind knee) 5  Knee extension 5 5  Ankle dorsiflexion 5 5   (Blank rows = not tested)  LOWER EXTREMITY ROM: WFL (limited by decreased flexibility and nerve tension)  GAIT: Distance walked: 40 Assistive device utilized: None Level of assistance: Modified independence Comments: Short step and stride lengths, bil hip ER, wide stance    TODAY'S TREATMENT:                                                                                                                              DATE:   01/11/22 See pt ed  PATIENT EDUCATION:  Education details: PT eval findings, anticipated POC, need for further assessment of LE nerve tension, initial HEP, and benefits of aquatic therapy.  Person educated: Patient Education method: Explanation, Demonstration, and Handouts Education comprehension: verbalized understanding and returned demonstration  HOME EXERCISE PROGRAM: Access Code: [email or web address] URL: [email or web address] Date: 01/11/2022 Prepared by: Raynelle Fanning  Exercises - Standing Hip Flexion with Counter Support  - 2 x daily - 7 x weekly - 1 sets - 10 reps - Seated Hamstring Stretch  - 2 x daily - 7 x weekly - 1 sets - 3 reps - 30-60 sec hold  ASSESSMENT:  CLINICAL IMPRESSION: Paul Adkins is a 76 y.o. male who was seen today for physical therapy evaluation and treatment for bil leg pain. Patient originally experienced a R HS strain 3-4 months ago, but now reports bil hip and leg pain that limits walking and standing. He has markedly tight HS bil and increased tone and pain in the Right HS. He has N/T in bil lateral thighs, pain in bil adductors and positive sciatic nerve tension bil.  Pt would benefit from further assessment of low back re: nerve pain. Co-morbidities are considerable and  make pt a very good candidate for aquatic PT to address general deconditioning and pain. Paul Adkins will benefit from skilled PT to address these deficits.  OBJECTIVE IMPAIRMENTS: decreased activity tolerance, decreased endurance, difficulty walking, decreased ROM, decreased strength, increased edema, increased muscle spasms, impaired  flexibility, impaired sensation, obesity, and pain.   ACTIVITY LIMITATIONS: standing, stairs, and locomotion level  PARTICIPATION LIMITATIONS:  affects all ADLs  PERSONAL FACTORS: Fitness and 3+ comorbidities: CKD, DM, HTN, depression, neuropathy abdomen and thighs, lumbar fusion, CVA, severe obesity  are also affecting patient's functional outcome.   REHAB POTENTIAL: Good  CLINICAL DECISION MAKING: Evolving/moderate complexity  EVALUATION COMPLEXITY: Moderate   GOALS: Goals reviewed with patient? Yes  SHORT TERM GOALS: Target date: 01/24/2022  Remove Blue [employer])  Patient will be independent with initial HEP. Baseline:  Goal status: INITIAL    LONG TERM GOALS: Target date: 02/22/2022 (Remove Blue Hyperlink)  Patient will be independent with advanced/ongoing HEP to improve outcomes and carryover.  Baseline:  Goal status: INITIAL  2.  Patient will report at least 75% improvement in bil leg pain to improve QOL and allow for improved gait mechanics. Baseline:  Goal status: INITIAL  3.  Patient will demonstrate negative nerve tension tests in BLE. Baseline:  Goal status: INITIAL  4.  Patient will demonstrate improved LE strength to 5/5 to ease transfers. Baseline:  Goal status: INITIAL  5.  Patient will report improved tolerance to standing and walking by 25-50% to improve access to community.  Baseline:  Goal status: INITIAL  6. Patient will report no spasms in R hamstrings with activity indicating improved flexibility. Baseline:  Goal status: INITIAL  7.  Patient will report >= 26/80 on LEFS  to demonstrate improved functional  ability. Baseline: 17 / 80 = 21.3 % Goal status: INITIAL    PLAN:  PT FREQUENCY: 2x/week  PT DURATION: 6 weeks  PLANNED INTERVENTIONS: Therapeutic exercises, Therapeutic activity, Neuromuscular re-education, Balance training, Gait training, Patient/Family education, Self Care, Joint mobilization, Aquatic Therapy, Dry Needling, Electrical stimulation, Spinal mobilization, Cryotherapy, Moist heat, Taping, Ultrasound, Ionotophoresis 4mg /ml Dexamethasone, and Manual therapy  PLAN FOR NEXT SESSION: General conditioning and LE strengthening, (For PT: Assess back further to determine source of nerve tension), DN and/or manual therapy to R hamstrings,bil hip Adductors, gentle HS strengthening and bil HS/quad flexibility. Gait and balance as indicated.   Kellis Topete, PT 01/11/2022, 10:32 AM

## 2022-01-11 ENCOUNTER — Encounter: Payer: Self-pay | Admitting: Physical Therapy

## 2022-01-11 ENCOUNTER — Ambulatory Visit: Payer: Medicare PPO | Attending: Family | Admitting: Physical Therapy

## 2022-01-11 ENCOUNTER — Other Ambulatory Visit: Payer: Self-pay

## 2022-01-11 DIAGNOSIS — R252 Cramp and spasm: Secondary | ICD-10-CM | POA: Insufficient documentation

## 2022-01-11 DIAGNOSIS — M79604 Pain in right leg: Secondary | ICD-10-CM | POA: Insufficient documentation

## 2022-01-11 DIAGNOSIS — M79605 Pain in left leg: Secondary | ICD-10-CM | POA: Insufficient documentation

## 2022-01-11 DIAGNOSIS — R2689 Other abnormalities of gait and mobility: Secondary | ICD-10-CM | POA: Diagnosis present

## 2022-01-17 ENCOUNTER — Ambulatory Visit: Payer: Medicare PPO

## 2022-01-17 DIAGNOSIS — M79605 Pain in left leg: Secondary | ICD-10-CM

## 2022-01-17 DIAGNOSIS — R2689 Other abnormalities of gait and mobility: Secondary | ICD-10-CM

## 2022-01-17 DIAGNOSIS — M79604 Pain in right leg: Secondary | ICD-10-CM

## 2022-01-17 DIAGNOSIS — R252 Cramp and spasm: Secondary | ICD-10-CM

## 2022-01-17 NOTE — Therapy (Signed)
OUTPATIENT PHYSICAL THERAPY TREATMENT   Patient Name: Paul Adkins MRN: [id number] DOB:15-Apr-1945, 76 y.o., male Today's Date: 01/17/2022   PT End of Session - 01/17/22 0851     Visit Number 2    Number of Visits 12    Date for PT Re-Evaluation 02/22/22    Authorization Type [employer] MCR    Authorization Time Period 01/11/22-02/22/22    Authorization - Visit Number 2    Authorization - Number of Visits 12    Progress Note Due on Visit 10    PT Start Time 0800    PT Stop Time 3151    PT Time Calculation (min) 43 min    Activity Tolerance Patient tolerated treatment well    Behavior During Therapy [facility] for tasks assessed/performed              Past Medical History:  Diagnosis Date   Carpal tunnel syndrome    Chronic kidney disease    Depression    Diabetes mellitus without complication ([place])    ED (erectile dysfunction)    Edema    Hyperlipidemia    Hypertension    Lumbar disc disease    Neuropathy    Sleep apnea    Stroke [facility])    Past Surgical History:  Procedure Laterality Date   BACK SURGERY     VASECTOMY     Patient Active Problem List   Diagnosis Date Noted   Conjunctivitis 09/19/2018   Obstructive sleep apnea 08/26/2013   Type 1 diabetes mellitus with complications ([facility]) 76/16/0737   Essential hypertension, benign 09/04/2012   Hypogonadism male 09/04/2012   Depression 09/04/2012   Pure hypercholesterolemia 09/04/2012   Severe obesity (BMI >= 40) ([place]) 09/04/2012    PCP: Marrian Salvage, FNP   REFERRING PROVIDER: Marrian Salvage, FNP   REFERRING DIAG: [phone] (ICD-10-CM) - Right leg pain   THERAPY DIAG:  Pain in right leg  Pain in left leg  Other abnormalities of gait and mobility  Cramp and spasm  Rationale for Evaluation and Treatment: Rehabilitation  ONSET DATE: 3-4 months ago  SUBJECTIVE:   SUBJECTIVE STATEMENT: Pt reports discomfort that continues in his legs and he feels that he gets out of breath when  walking.  PERTINENT HISTORY: CKD, DM, HTN, depression, neuropathy abdomen and thighs, lumbar fusion, CVA, severe obesity PAIN:  Are you having pain? Yes: NPRS scale: 3-4/10 Pain location: hips and legs Pain description: ache and muscles don't want to move anymore Aggravating factors: standing and walking Relieving factors: sitting  PRECAUTIONS: None  WEIGHT BEARING RESTRICTIONS: No  FALLS:  Has patient fallen in last 6 months? 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 No DVT, thrombosis.  PATIENT SURVEYS:  LEFS 17 / 80 = 21.3 %  COGNITION: Overall cognitive status: Within functional limits for tasks assessed     SENSATION: Light touch: Impaired  and bil outer thighs  EDEMA: Reports significant edema; hasn't been determined why.   MUSCLE LENGTH: SW:NIOEVO less than 45 deg bil Quads:mod tightness bil ITB: NT Piriformis:mod tightness Hip Flexors:NT Heelcords: WNL   POSTURE: No Significant postural limitations  PALPATION: Palpation: TTP at post R knee and HS. Increased tissue tension  in right HS  LUMBAR ROM:   Active  A/PROM  eval  Flexion Limited by tight HS (hands to knees)  Extension WNL some pain in low back  Right lateral flexion full  Left lateral flexion full  Right rotation   Left rotation    (Blank rows = not tested)    LOWER EXTREMITY MMT:   ROM Right eval Left eval  Hip flexion 4+ 4-  Hip extension 4+ 4+  Hip abduction 5 4+  Hip adduction 5 5  Hip internal rotation    Hip external rotation    Knee flexion 4+ (mild pain behind knee) 5  Knee extension 5 5  Ankle dorsiflexion 5 5   (Blank rows = not  tested)  LOWER EXTREMITY ROM: WFL (limited by decreased flexibility and nerve tension)  GAIT: Distance walked: 40 Assistive device utilized: None Level of assistance: Modified independence Comments: Short step and stride lengths, bil hip ER, wide stance    TODAY'S TREATMENT:                                                                                                                              DATE:   01/17/22 Therapeutic Exercise: to improve strength and mobility.  Demo, verbal and tactile cues throughout for technique.  Nustep [id number] - limited by SOB Seated hamstring stretch 2x30 sec BLE Standing hip flexion x 10 BLE Standing hip abd and ext - limited by pain Seated hip ADD ball squeeze 5x5" Seated march x 5 BLE Seated hip ABD blue TB x 5 BLE Supine LTR 5 reps both ways Supine heel slides x 10  01/11/22 See pt ed  PATIENT EDUCATION:  Education details: PT eval findings, anticipated POC, need for further assessment of LE nerve tension, initial HEP, and benefits of aquatic therapy.  Person educated: Patient Education method: Explanation, Demonstration, and Handouts Education comprehension: verbalized understanding and returned demonstration  HOME EXERCISE PROGRAM: Access Code: [email or web address] URL: [email or web address] Date: 01/17/2022 Prepared by: BClarene Essex Exercises - Standing Hip Flexion with Counter Support  - 2 x daily - 7 x weekly - 1 sets - 10 reps - Seated Hamstring Stretch  - 2 x daily - 7 x weekly - 1 sets - 3 reps - 30-60 sec hold - Seated March  - 1 x daily - 7 x weekly - 2 sets - 5 reps - Seated Hip Abduction with Resistance  - 1 x daily - 7 x weekly - 2 sets - 5 reps - Supine Heel Slide  - 1 x daily - 7 x weekly - 2 sets - 10 reps - Supine Lower Trunk Rotation  - 1 x daily - 7 x weekly - 2 sets - 5 reps  ASSESSMENT:  CLINICAL IMPRESSION: Pt was mostly limited by general fatigue during session. We decreased repetitions to 5 for most  exercises d/t fatigue. We needed to take frequent breaks throughout the session to allow for recovery. Started HEP  with seated exercises, he would benefit from gentle progression of exercises from sitting to standing.  OBJECTIVE IMPAIRMENTS: decreased activity tolerance, decreased endurance, difficulty walking, decreased ROM, decreased strength, increased edema, increased muscle spasms, impaired flexibility, impaired sensation, obesity, and pain.   ACTIVITY LIMITATIONS: standing, stairs, and locomotion level  PARTICIPATION LIMITATIONS:  affects all ADLs  PERSONAL FACTORS: Fitness and 3+ comorbidities: CKD, DM, HTN, depression, neuropathy abdomen and thighs, lumbar fusion, CVA, severe obesity  are also affecting patient's functional outcome.   REHAB POTENTIAL: Good  CLINICAL DECISION MAKING: Evolving/moderate complexity  EVALUATION COMPLEXITY: Moderate   GOALS: Goals reviewed with patient? Yes  SHORT TERM GOALS: Target date: 01/24/2022  Remove Blue Hyperlink)  Patient will be independent with initial HEP. Baseline:  Goal status: INITIAL    LONG TERM GOALS: Target date: 02/22/2022 (Remove Blue Hyperlink)  Patient will be independent with advanced/ongoing HEP to improve outcomes and carryover.  Baseline:  Goal status: INITIAL  2.  Patient will report at least 75% improvement in bil leg pain to improve QOL and allow for improved gait mechanics. Baseline:  Goal status: INITIAL  3.  Patient will demonstrate negative nerve tension tests in BLE. Baseline:  Goal status: INITIAL  4.  Patient will demonstrate improved LE strength to 5/5 to ease transfers. Baseline:  Goal status: INITIAL  5.  Patient will report improved tolerance to standing and walking by 25-50% to improve access to community.  Baseline:  Goal status: INITIAL  6. Patient will report no spasms in R hamstrings with activity indicating improved flexibility. Baseline:  Goal status: INITIAL  7.  Patient will  report >= 26/80 on LEFS  to demonstrate improved functional ability. Baseline: 17 / 80 = 21.3 % Goal status: INITIAL    PLAN:  PT FREQUENCY: 2x/week  PT DURATION: 6 weeks  PLANNED INTERVENTIONS: Therapeutic exercises, Therapeutic activity, Neuromuscular re-education, Balance training, Gait training, Patient/Family education, Self Care, Joint mobilization, Aquatic Therapy, Dry Needling, Electrical stimulation, Spinal mobilization, Cryotherapy, Moist heat, Taping, Ultrasound, Ionotophoresis '4mg'$ /ml Dexamethasone, and Manual therapy  PLAN FOR NEXT SESSION: General conditioning and LE strengthening, (For PT: Assess back further to determine source of nerve tension), DN and/or manual therapy to R hamstrings,bil hip Adductors, gentle HS strengthening and bil HS/quad flexibility. Gait and balance as indicated.   Artist Pais, PTA 01/17/2022, 8:53 AM

## 2022-01-23 NOTE — Therapy (Signed)
OUTPATIENT PHYSICAL THERAPY TREATMENT   Patient Name: Paul Adkins MRN: [id number] DOB:Jul 25, 1945, 76 y.o., male Today's Date: 01/24/2022   PT End of Session - 01/24/22 0847     Visit Number 3    Number of Visits 12    Date for PT Re-Evaluation 02/22/22    Authorization Type [employer] MCR    Authorization Time Period 01/11/22-02/22/22    Authorization - Visit Number 3    Authorization - Number of Visits 12    Progress Note Due on Visit 10    PT Start Time 0845    PT Stop Time 0930    PT Time Calculation (min) 45 min    Activity Tolerance Patient tolerated treatment well    Behavior During Therapy WFL for tasks assessed/performed              Past Medical History:  Diagnosis Date   Carpal tunnel syndrome    Chronic kidney disease    Depression    Diabetes mellitus without complication ([place])    ED (erectile dysfunction)    Edema    Hyperlipidemia    Hypertension    Lumbar disc disease    Neuropathy    Sleep apnea    Stroke [facility])    Past Surgical History:  Procedure Laterality Date   BACK SURGERY     VASECTOMY     Patient Active Problem List   Diagnosis Date Noted   Conjunctivitis 09/19/2018   Obstructive sleep apnea 08/26/2013   Type 1 diabetes mellitus with complications ([place]) 38/75/6433   Essential hypertension, benign 09/04/2012   Hypogonadism male 09/04/2012   Depression 09/04/2012   Pure hypercholesterolemia 09/04/2012   Severe obesity (BMI >= 40) ([place]) 09/04/2012    PCP: Marrian Salvage, FNP   REFERRING PROVIDER: Marrian Salvage, FNP   REFERRING DIAG: [phone] (ICD-10-CM) - Right leg pain   THERAPY DIAG:  Pain in right leg  Pain in left leg  Other abnormalities of gait and mobility  Cramp and spasm  Rationale for Evaluation and Treatment: Rehabilitation  ONSET DATE: 3-4 months ago  SUBJECTIVE:   SUBJECTIVE STATEMENT: No more pain than usual  PERTINENT HISTORY: CKD, DM, HTN, depression, neuropathy abdomen and  thighs, lumbar fusion, CVA, severe obesity PAIN:  Are you having pain? Yes: NPRS scale: 3-4/10 Pain location: hips and legs Pain description: ache and muscles don't want to move anymore Aggravating factors: standing and walking Relieving factors: sitting  PRECAUTIONS: None  WEIGHT BEARING RESTRICTIONS: No  FALLS:  Has patient fallen in last 6 months? 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 No DVT, thrombosis.  PATIENT SURVEYS:  LEFS 17 / 80 = 21.3 %  COGNITION: Overall cognitive status: Within functional limits for tasks assessed     SENSATION: Light touch: Impaired  and bil outer thighs  EDEMA: Reports significant edema; hasn't been determined why.   MUSCLE LENGTH: CZ:YSAYTK less than 45 deg bil Quads:mod tightness bil ITB: NT Piriformis:mod tightness Hip Flexors:NT Heelcords: WNL   POSTURE: No Significant postural limitations  PALPATION: Palpation: TTP at post R knee and HS. Increased tissue tension in right HS  LUMBAR ROM:   Active  A/PROM  eval  Flexion Limited by tight HS (hands to knees)  Extension WNL some pain in low back  Right lateral flexion full  Left lateral flexion full  Right rotation   Left rotation    (Blank rows = not tested)    LOWER EXTREMITY MMT:   ROM Right eval Left eval Right 01/24/22 Left 01/24/22  Hip flexion 4+ 4-    Hip extension 4+ 4+ 5 4+  Hip abduction 5 4+    Hip adduction 5 5    Hip internal rotation      Hip external rotation      Knee flexion 4+ (mild pain behind knee) 5    Knee extension 5 5    Ankle dorsiflexion 5 5     (Blank rows = not tested)  LOWER EXTREMITY ROM: WFL  (limited by decreased flexibility and nerve tension)  GAIT: Distance walked: 40 Assistive device utilized: None Level of assistance: Modified independence Comments: Short step and stride lengths, bil hip ER, wide stance    TODAY'S TREATMENT:                                                                                                                              DATE:   01/24/22 Therapeutic Exercise: to improve strength and mobility.  Demo, verbal and tactile cues throughout for technique.  Nustep L2x52mn - limited by SOB and leg fatigue. O2 97 and HR 87 bpm after 4 min Attempted prone lying but difficulty with breathing. SDLY hip ABD L 2x5 with PT pressure to stop from rolling back, R 2x5. Supine LTR 5 reps both ways Supine SLR 2 x 5 bil Seated marching 3x5 Seated nerve glide x 10 each side 5 sec hold (knee ext) Seated piriformis stretch bil 2 x 30 sec     01/17/22 Therapeutic Exercise: to improve strength and mobility.  Demo, verbal and tactile cues throughout for technique.  Nustep L4x525m - limited by SOB Seated hamstring stretch 2x30 sec BLE Standing hip flexion x 10 BLE Standing hip abd and ext - limited by pain Seated hip ADD ball squeeze 5x5" Seated march x 5 BLE Seated hip ABD blue TB x 5 BLE Supine LTR 5 reps both ways Supine heel slides x 10    01/11/22 See pt ed  PATIENT EDUCATION:  Education details: HEP update  Person educated: Patient Education method: Explanation, Demonstration, and Handouts Education comprehension: verbalized understanding and returned demonstration  HOME EXERCISE PROGRAM: Access Code: [email or web address] URL: [email or web address] Date: 01/24/2022 Prepared by: JuAlmyra FreeExercises - Standing Hip Flexion with Counter Support  - 2 x daily - 7 x weekly - 1 sets - 10 reps - Seated Hamstring Stretch  - 2 x daily - 7 x weekly - 1 sets - 3 reps - 30-60 sec hold - Seated March  - 1 x daily - 7 x weekly - 2 sets - 5 reps - Seated  Hip Abduction with Resistance  - 1 x daily -  7 x weekly - 2 sets - 5 reps - Supine Heel Slide  - 1 x daily - 7 x weekly - 2 sets - 10 reps - Supine Lower Trunk Rotation  - 1 x daily - 7 x weekly - 2 sets - 5 reps - Seated Long Arc Quad  - 1 x daily - 7 x weekly - 3 sets - 10 reps - 5 sec hold - Seated Table Piriformis Stretch  - 2 x daily - 7 x weekly - 1 sets - 3 reps - 30-60 sec hold  ASSESSMENT:  CLINICAL IMPRESSION: Gaelen continues to be limited with TE by SOB and feelings of LE weakness. He requires only short breaks to resume TE and his O2 sats and HR were good during aerobic TE. He did report his blood sugar was increasing during TE and self administered insulin. Pt advised to hold HS stretch as he is really experiencing nerve pain vs. Muscle stretch. Nerve glide issued instead with 5 sec holds. Brainard continues to demonstrate potential for improvement and would benefit from continued skilled therapy to address impairments.    OBJECTIVE IMPAIRMENTS: decreased activity tolerance, decreased endurance, difficulty walking, decreased ROM, decreased strength, increased edema, increased muscle spasms, impaired flexibility, impaired sensation, obesity, and pain.   ACTIVITY LIMITATIONS: standing, stairs, and locomotion level  PARTICIPATION LIMITATIONS:  affects all ADLs  PERSONAL FACTORS: Fitness and 3+ comorbidities: CKD, DM, HTN, depression, neuropathy abdomen and thighs, lumbar fusion, CVA, severe obesity  are also affecting patient's functional outcome.   REHAB POTENTIAL: Good  CLINICAL DECISION MAKING: Evolving/moderate complexity  EVALUATION COMPLEXITY: Moderate   GOALS: Goals reviewed with patient? Yes  SHORT TERM GOALS: Target date: 01/24/2022  Remove Blue Hyperlink)  Patient will be independent with initial HEP. Baseline:  Goal status: MET    LONG TERM GOALS: Target date: 02/22/2022 (Remove Blue Hyperlink)  Patient will be independent with advanced/ongoing HEP to improve  outcomes and carryover.  Baseline:  Goal status: INITIAL  2.  Patient will report at least 75% improvement in bil leg pain to improve QOL and allow for improved gait mechanics. Baseline:  Goal status: INITIAL  3.  Patient will demonstrate negative nerve tension tests in BLE. Baseline:  Goal status: INITIAL  4.  Patient will demonstrate improved LE strength to 5/5 to ease transfers. Baseline:  Goal status: INITIAL  5.  Patient will report improved tolerance to standing and walking by 25-50% to improve access to community.  Baseline:  Goal status: INITIAL  6. Patient will report no spasms in R hamstrings with activity indicating improved flexibility. Baseline:  Goal status: INITIAL  7.  Patient will report >= 26/80 on LEFS  to demonstrate improved functional ability. Baseline: 17 / 80 = 21.3 % Goal status: INITIAL    PLAN:  PT FREQUENCY: 2x/week  PT DURATION: 6 weeks  PLANNED INTERVENTIONS: Therapeutic exercises, Therapeutic activity, Neuromuscular re-education, Balance training, Gait training, Patient/Family education, Self Care, Joint mobilization, Aquatic Therapy, Dry Needling, Electrical stimulation, Spinal mobilization, Cryotherapy, Moist heat, Taping, Ultrasound, Ionotophoresis 62m/ml Dexamethasone, and Manual therapy  PLAN FOR NEXT SESSION: General conditioning and LE strengthening, DN and/or manual therapy to R hamstrings,bil hip Adductors, gentle HS strengthening and bil HS/quad flexibility. Gait and balance as indicated.   Cordero Surette, PT 01/24/2022, 1:04 PM

## 2022-01-24 ENCOUNTER — Ambulatory Visit: Payer: Medicare PPO | Admitting: Physical Therapy

## 2022-01-24 ENCOUNTER — Encounter: Payer: Self-pay | Admitting: Physical Therapy

## 2022-01-24 DIAGNOSIS — R2689 Other abnormalities of gait and mobility: Secondary | ICD-10-CM

## 2022-01-24 DIAGNOSIS — M79604 Pain in right leg: Secondary | ICD-10-CM

## 2022-01-24 DIAGNOSIS — R252 Cramp and spasm: Secondary | ICD-10-CM

## 2022-01-24 DIAGNOSIS — M79605 Pain in left leg: Secondary | ICD-10-CM

## 2022-01-27 ENCOUNTER — Ambulatory Visit: Payer: Medicare PPO | Attending: Family

## 2022-01-27 DIAGNOSIS — R252 Cramp and spasm: Secondary | ICD-10-CM | POA: Diagnosis present

## 2022-01-27 DIAGNOSIS — M79604 Pain in right leg: Secondary | ICD-10-CM | POA: Diagnosis present

## 2022-01-27 DIAGNOSIS — M79605 Pain in left leg: Secondary | ICD-10-CM | POA: Diagnosis present

## 2022-01-27 DIAGNOSIS — R2689 Other abnormalities of gait and mobility: Secondary | ICD-10-CM | POA: Insufficient documentation

## 2022-01-27 NOTE — Therapy (Signed)
OUTPATIENT PHYSICAL THERAPY TREATMENT   Patient Name: Paul Adkins MRN: [id number] DOB:01-Dec-1945, 76 y.o., male Today's Date: 01/27/2022   PT End of Session - 01/27/22 0933     Visit Number 4    Number of Visits 12    Date for PT Re-Evaluation 02/22/22    Authorization Type [employer] MCR    Authorization Time Period 01/11/22-02/22/22    Authorization - Visit Number 4    Authorization - Number of Visits 12    Progress Note Due on Visit 10    PT Start Time 0845    PT Stop Time 0929    PT Time Calculation (min) 44 min    Activity Tolerance Patient tolerated treatment well    Behavior During Therapy [facility] for tasks assessed/performed               Past Medical History:  Diagnosis Date   Carpal tunnel syndrome    Chronic kidney disease    Depression    Diabetes mellitus without complication ([place])    ED (erectile dysfunction)    Edema    Hyperlipidemia    Hypertension    Lumbar disc disease    Neuropathy    Sleep apnea    Stroke [facility])    Past Surgical History:  Procedure Laterality Date   BACK SURGERY     VASECTOMY     Patient Active Problem List   Diagnosis Date Noted   Conjunctivitis 09/19/2018   Obstructive sleep apnea 08/26/2013   Type 1 diabetes mellitus with complications ([place]) 19/41/7408   Essential hypertension, benign 09/04/2012   Hypogonadism male 09/04/2012   Depression 09/04/2012   Pure hypercholesterolemia 09/04/2012   Severe obesity (BMI >= 40) ([place]) 09/04/2012    PCP: Marrian Salvage, [place]   REFERRING PROVIDER: Marrian Salvage, FNP   REFERRING DIAG: [phone] (ICD-10-CM) - Right leg pain   THERAPY DIAG:  Pain in right leg  Pain in left leg  Other abnormalities of gait and mobility  Cramp and spasm  Rationale for Evaluation and Treatment: Rehabilitation  ONSET DATE: 3-4 months ago  SUBJECTIVE:   SUBJECTIVE STATEMENT: No pain at the moment, still has to make himself do the exercises.  PERTINENT HISTORY: CKD, DM,  HTN, depression, neuropathy abdomen and thighs, lumbar fusion, CVA, severe obesity PAIN:  Are you having pain? Yes: NPRS scale: 0/10 Pain location: hips and legs Pain description: ache and muscles don't want to move anymore Aggravating factors: standing and walking Relieving factors: sitting  PRECAUTIONS: None  WEIGHT BEARING RESTRICTIONS: No  FALLS:  Has patient fallen in last 6 months? 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 No DVT, thrombosis.  PATIENT SURVEYS:  LEFS 17 / 80 = 21.3 %  COGNITION: Overall cognitive status: Within functional limits for tasks assessed     SENSATION: Light touch: Impaired  and bil outer thighs  EDEMA: Reports significant edema; hasn't been determined why.   MUSCLE LENGTH: HU:DJSHFW less than 45 deg bil Quads:mod tightness bil ITB: NT Piriformis:mod tightness Hip Flexors:NT Heelcords: WNL   POSTURE: No Significant postural limitations  PALPATION: Palpation: TTP at post R knee and HS. Increased tissue tension in right HS  LUMBAR  ROM:   Active  A/PROM  eval  Flexion Limited by tight HS (hands to knees)  Extension WNL some pain in low back  Right lateral flexion full  Left lateral flexion full  Right rotation   Left rotation    (Blank rows = not tested)    LOWER EXTREMITY MMT:   ROM Right eval Left eval Right 01/24/22 Left 01/24/22  Hip flexion 4+ 4-    Hip extension 4+ 4+ 5 4+  Hip abduction 5 4+    Hip adduction 5 5    Hip internal rotation      Hip external rotation      Knee flexion 4+ (mild pain behind knee) 5    Knee extension 5 5    Ankle dorsiflexion 5 5     (Blank rows = not  tested)  LOWER EXTREMITY ROM: WFL (limited by decreased flexibility and nerve tension)  GAIT: Distance walked: 40 Assistive device utilized: None Level of assistance: Modified independence Comments: Short step and stride lengths, bil hip ER, wide stance    TODAY'S TREATMENT:                                                                                                                              DATE:   01/27/22 Therapeutic Exercise: to improve strength and mobility.  Demo, verbal and tactile cues throughout for technique.  Nustep L2x14mn - frequent breaks d/t SOB : SpO2 96% at rest Supine LTR 5x for 5 sec holds Supine march 5x BLE Supine isometric hip ext against mat 2x5 BLE Passive hamstring stretch 3x10" BLE Seated hip up and over with blue TB x 10 BLE Forward stepping x 10 BLE - pre-gait activity  01/24/22 Therapeutic Exercise: to improve strength and mobility.  Demo, verbal and tactile cues throughout for technique.  Nustep L2x524m - limited by SOB and leg fatigue. O2 97 and HR 87 bpm after 4 min Attempted prone lying but difficulty with breathing. SDLY hip ABD L 2x5 with PT pressure to stop from rolling back, R 2x5. Supine LTR 5 reps both ways Supine SLR 2 x 5 bil Seated marching 3x5 Seated nerve glide x 10 each side 5 sec hold (knee ext) Seated piriformis stretch bil 2 x 30 sec     01/17/22 Therapeutic Exercise: to improve strength and mobility.  Demo, verbal and tactile cues throughout for technique.  Nustep L4x5m66m- limited by SOB Seated hamstring stretch 2x30 sec BLE Standing hip flexion x 10 BLE Standing hip abd and ext - limited by pain Seated hip ADD ball squeeze 5x5" Seated march x 5 BLE Seated hip ABD blue TB x 5 BLE Supine LTR 5 reps both ways Supine heel slides x 10    01/11/22 See pt ed  PATIENT EDUCATION:  Education details: HEP update  Person educated: Patient Education method: Explanation, Demonstration, and Handouts Education  comprehension: verbalized understanding and returned demonstration  HOME EXERCISE PROGRAM: Access Code: [email or web address]  URL: [email or web address] Date: 01/24/2022 Prepared by: Almyra Free  Exercises - Standing Hip Flexion with Counter Support  - 2 x daily - 7 x weekly - 1 sets - 10 reps - Seated Hamstring Stretch  - 2 x daily - 7 x weekly - 1 sets - 3 reps - 30-60 sec hold - Seated March  - 1 x daily - 7 x weekly - 2 sets - 5 reps - Seated Hip Abduction with Resistance  - 1 x daily - 7 x weekly - 2 sets - 5 reps - Supine Heel Slide  - 1 x daily - 7 x weekly - 2 sets - 10 reps - Supine Lower Trunk Rotation  - 1 x daily - 7 x weekly - 2 sets - 5 reps - Seated Long Arc Quad  - 1 x daily - 7 x weekly - 3 sets - 10 reps - 5 sec hold - Seated Table Piriformis Stretch  - 2 x daily - 7 x weekly - 1 sets - 3 reps - 30-60 sec hold  ASSESSMENT:  CLINICAL IMPRESSION: Earle continues to be limited with TE by SOB and feelings of LE weakness. He requires only short breaks to resume TE and his O2 sats and HR were good during aerobic TE. He shows to have balance deficits as well, with doing pre gait activities with posterior sway during fwd and back swaying. Would recommend incorporating balance activities to improve function.   OBJECTIVE IMPAIRMENTS: decreased activity tolerance, decreased endurance, difficulty walking, decreased ROM, decreased strength, increased edema, increased muscle spasms, impaired flexibility, impaired sensation, obesity, and pain.   ACTIVITY LIMITATIONS: standing, stairs, and locomotion level  PARTICIPATION LIMITATIONS:  affects all ADLs  PERSONAL FACTORS: Fitness and 3+ comorbidities: CKD, DM, HTN, depression, neuropathy abdomen and thighs, lumbar fusion, CVA, severe obesity  are also affecting patient's functional outcome.   REHAB POTENTIAL: Good  CLINICAL DECISION MAKING: Evolving/moderate complexity  EVALUATION COMPLEXITY: Moderate   GOALS: Goals reviewed with  patient? Yes  SHORT TERM GOALS: Target date: 01/24/2022  Remove Blue Hyperlink)  Patient will be independent with initial HEP. Baseline:  Goal status: MET    LONG TERM GOALS: Target date: 02/22/2022 (Remove Blue Hyperlink)  Patient will be independent with advanced/ongoing HEP to improve outcomes and carryover.  Baseline:  Goal status: INITIAL  2.  Patient will report at least 75% improvement in bil leg pain to improve QOL and allow for improved gait mechanics. Baseline:  Goal status: INITIAL  3.  Patient will demonstrate negative nerve tension tests in BLE. Baseline:  Goal status: INITIAL  4.  Patient will demonstrate improved LE strength to 5/5 to ease transfers. Baseline:  Goal status: INITIAL  5.  Patient will report improved tolerance to standing and walking by 25-50% to improve access to community.  Baseline:  Goal status: INITIAL  6. Patient will report no spasms in R hamstrings with activity indicating improved flexibility. Baseline:  Goal status: INITIAL  7.  Patient will report >= 26/80 on LEFS  to demonstrate improved functional ability. Baseline: 17 / 80 = 21.3 % Goal status: INITIAL    PLAN:  PT FREQUENCY: 2x/week  PT DURATION: 6 weeks  PLANNED INTERVENTIONS: Therapeutic exercises, Therapeutic activity, Neuromuscular re-education, Balance training, Gait training, Patient/Family education, Self Care, Joint mobilization, Aquatic Therapy, Dry Needling, Electrical stimulation, Spinal mobilization, Cryotherapy, Moist heat, Taping, Ultrasound, Ionotophoresis 2m/ml Dexamethasone, and Manual therapy  PLAN FOR NEXT SESSION: General conditioning and LE strengthening, DN and/or manual therapy to R hamstrings,bil hip  Adductors, gentle HS strengthening and bil HS/quad flexibility. Gait and balance as indicated.   Artist Pais, PTA 01/27/2022, 9:33 AM

## 2022-01-30 ENCOUNTER — Ambulatory Visit (HOSPITAL_BASED_OUTPATIENT_CLINIC_OR_DEPARTMENT_OTHER): Payer: Medicare PPO | Attending: Family | Admitting: Physical Therapy

## 2022-01-30 ENCOUNTER — Other Ambulatory Visit (INDEPENDENT_AMBULATORY_CARE_PROVIDER_SITE_OTHER): Payer: Medicare PPO

## 2022-01-30 ENCOUNTER — Encounter (HOSPITAL_BASED_OUTPATIENT_CLINIC_OR_DEPARTMENT_OTHER): Payer: Self-pay | Admitting: Physical Therapy

## 2022-01-30 DIAGNOSIS — M79604 Pain in right leg: Secondary | ICD-10-CM | POA: Insufficient documentation

## 2022-01-30 DIAGNOSIS — R2689 Other abnormalities of gait and mobility: Secondary | ICD-10-CM | POA: Diagnosis not present

## 2022-01-30 DIAGNOSIS — E291 Testicular hypofunction: Secondary | ICD-10-CM | POA: Diagnosis not present

## 2022-01-30 DIAGNOSIS — R252 Cramp and spasm: Secondary | ICD-10-CM | POA: Insufficient documentation

## 2022-01-30 DIAGNOSIS — M79605 Pain in left leg: Secondary | ICD-10-CM | POA: Insufficient documentation

## 2022-01-30 LAB — TESTOSTERONE: Testosterone: 365.03 ng/dL (ref 300.00–890.00)

## 2022-01-30 NOTE — Therapy (Signed)
OUTPATIENT PHYSICAL THERAPY TREATMENT   Patient Name: Paul Adkins MRN: [id number] DOB:[birth year], 76 y.o., male Today's Date: 01/30/2022   PT End of Session - 01/30/22 0732     Visit Number 5    Number of Visits 12    Date for PT Re-Evaluation 02/22/22    Authorization Type [employer] MCR    Authorization Time Period 01/11/22-02/22/22    Authorization - Number of Visits 12    Progress Note Due on Visit 10    PT Start Time 0730    PT Stop Time 0811    PT Time Calculation (min) 41 min    Activity Tolerance Patient tolerated treatment well    Behavior During Therapy [facility] for tasks assessed/performed               Past Medical History:  Diagnosis Date   Carpal tunnel syndrome    Chronic kidney disease    Depression    Diabetes mellitus without complication ([place])    ED (erectile dysfunction)    Edema    Hyperlipidemia    Hypertension    Lumbar disc disease    Neuropathy    Sleep apnea    Stroke [facility])    Past Surgical History:  Procedure Laterality Date   BACK SURGERY     VASECTOMY     Patient Active Problem List   Diagnosis Date Noted   Conjunctivitis 09/19/2018   Obstructive sleep apnea 08/26/2013   Type 1 diabetes mellitus with complications ([place]) 62/69/4854   Essential hypertension, benign 09/04/2012   Hypogonadism male 09/04/2012   Depression 09/04/2012   Pure hypercholesterolemia 09/04/2012   Severe obesity (BMI >= 40) ([place]) 09/04/2012    PCP: Marrian Salvage, FNP   REFERRING PROVIDER: Marrian Salvage, FNP   REFERRING DIAG: [phone] (ICD-10-CM) - Right leg pain   THERAPY DIAG:  Pain in right leg  Pain in left leg  Other abnormalities of gait and mobility  Cramp and spasm  Rationale for Evaluation and Treatment: Rehabilitation  ONSET DATE: 3-4 months ago  SUBJECTIVE:   SUBJECTIVE STATEMENT: No pain at the moment.  He reports he is a little apprehensive about getting in pool, "it's been 50 years since I've been in the  water".  He has noticed that his stride when walking has improved a little.   PERTINENT HISTORY: CKD, DM, HTN, depression, neuropathy abdomen and thighs, lumbar fusion, CVA, severe obesity PAIN:  Are you having pain? Yes: NPRS scale: 0/10 Pain location: hips and legs Pain description: ache and muscles don't want to move anymore Aggravating factors: standing and walking Relieving factors: sitting  PRECAUTIONS: None  WEIGHT BEARING RESTRICTIONS: No  FALLS:  Has patient fallen in last 6 months? 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 No DVT, thrombosis.  PATIENT SURVEYS:  LEFS 17 / 80 = 21.3 %  COGNITION: Overall cognitive status: Within functional limits for tasks assessed     SENSATION: Light touch: Impaired  and bil outer thighs  EDEMA: Reports significant edema; hasn't been determined why.   MUSCLE LENGTH: [id number] less than 45 deg bil Quads:mod tightness bil ITB: NT Piriformis:mod tightness Hip Flexors:NT Heelcords: WNL   POSTURE: No Significant  postural limitations  PALPATION: Palpation: TTP at post R knee and HS. Increased tissue tension in right HS  LUMBAR ROM:   Active  A/PROM  eval  Flexion Limited by tight HS (hands to knees)  Extension WNL some pain in low back  Right lateral flexion full  Left lateral flexion full  Right rotation   Left rotation    (Blank rows = not tested)    LOWER EXTREMITY MMT:   ROM Right eval Left eval Right 01/24/22 Left 01/24/22  Hip flexion 4+ 4-    Hip extension 4+ 4+ 5 4+  Hip abduction 5 4+    Hip adduction 5 5    Hip internal rotation      Hip external rotation      Knee  flexion 4+ (mild pain behind knee) 5    Knee extension 5 5    Ankle dorsiflexion 5 5     (Blank rows = not tested)  LOWER EXTREMITY ROM: WFL (limited by decreased flexibility and nerve tension)  GAIT: Distance walked: 40 Assistive device utilized: None Level of assistance: Modified independence Comments: Short step and stride lengths, bil hip ER, wide stance    TODAY'S TREATMENT:                                                                                                                              DATE:    01/30/22 Pt seen for aquatic therapy today.  Treatment took place in water 3.25-4.5 ft in depth at the [facility]. Temp of water was 91.  Pt entered/exited the pool via stairs with supervision and with bilat rail.  * intro to [employer] * holding wall:  side stepping R/L; marching; heel/toe raises x 8; hip abdct/ addct x 5 x 3; hip ext x 5 x 3; hip circles x 10 each; squats with focus on hip hinge and form; return to side stepping  * holding wall with one hand and yellow hand float with other hand: forward / backward walking  * seated on bench in water:  cycling legs; hip abdct/add (with knees bent); alternating LAQ with DF; cycling; STS x 3  Pt requires the buoyancy and hydrostatic pressure of water for support, and to offload joints by unweighting joint load by at least 50 % in navel deep water and by at least 75-80% in chest to neck deep water.  Viscosity of the water is needed for resistance of strengthening. Water current perturbations provides challenge to standing balance requiring increased core activation.  01/27/22 Therapeutic Exercise: to improve strength and mobility.  Demo, verbal and tactile cues throughout for technique.  Nustep L2x106mn - frequent breaks d/t SOB : SpO2 96% at rest Supine LTR 5x for 5 sec holds Supine march 5x BLE Supine isometric hip ext against mat 2x5 BLE Passive hamstring stretch 3x10" BLE Seated hip up and over with blue TB x  10 BLE Forward stepping x 10 BLE - pre-gait  activity  01/24/22 Therapeutic Exercise: to improve strength and mobility.  Demo, verbal and tactile cues throughout for technique.  Nustep L2x42mn - limited by SOB and leg fatigue. O2 97 and HR 87 bpm after 4 min Attempted prone lying but difficulty with breathing. SDLY hip ABD L 2x5 with PT pressure to stop from rolling back, R 2x5. Supine LTR 5 reps both ways Supine SLR 2 x 5 bil Seated marching 3x5 Seated nerve glide x 10 each side 5 sec hold (knee ext) Seated piriformis stretch bil 2 x 30 sec     01/17/22 Therapeutic Exercise: to improve strength and mobility.  Demo, verbal and tactile cues throughout for technique.  Nustep L4x557m - limited by SOB Seated hamstring stretch 2x30 sec BLE Standing hip flexion x 10 BLE Standing hip abd and ext - limited by pain Seated hip ADD ball squeeze 5x5" Seated march x 5 BLE Seated hip ABD blue TB x 5 BLE Supine LTR 5 reps both ways Supine heel slides x 10    01/11/22 See pt ed  PATIENT EDUCATION:  Education details: aqCareers adviser Person educated: Patient Education method: ExConsulting civil engineerDemonstration Education comprehension: verbalized understanding and returned demonstration  HOME EXERCISE PROGRAM: Access Code: [email or web address] URL: [email or web address] Date: 01/24/2022 Prepared by: JuAlmyra FreeExercises - Standing Hip Flexion with Counter Support  - 2 x daily - 7 x weekly - 1 sets - 10 reps - Seated Hamstring Stretch  - 2 x daily - 7 x weekly - 1 sets - 3 reps - 30-60 sec hold - Seated March  - 1 x daily - 7 x weekly - 2 sets - 5 reps - Seated Hip Abduction with Resistance  - 1 x daily - 7 x weekly - 2 sets - 5 reps - Supine Heel Slide  - 1 x daily - 7 x weekly - 2 sets - 10 reps - Supine Lower Trunk Rotation  - 1 x daily - 7 x weekly - 2 sets - 5 reps - Seated Long Arc Quad  - 1 x daily - 7 x weekly - 3 sets - 10 reps - 5 sec hold - Seated Table Piriformis Stretch  - 2 x  daily - 7 x weekly - 1 sets - 3 reps - 30-60 sec hold  ASSESSMENT:  CLINICAL IMPRESSION: Pt disconnects his insulin pump prior to  entry in the water.  While he is not fearful in water, his decreased balance requires him to have UE support (on wall/ floatation device) while in the water.  Without UE support, pt tends to have posterior sway.  Will be incorporating balance exercises in future sessions to improve function and safety.  He reported some fatigue in LEs during exercises in the water.  Will benefit from having therapist in water with him until his confidence improves.  Goals are ongoing.    OBJECTIVE IMPAIRMENTS: decreased activity tolerance, decreased endurance, difficulty walking, decreased ROM, decreased strength, increased edema, increased muscle spasms, impaired flexibility, impaired sensation, obesity, and pain.   ACTIVITY LIMITATIONS: standing, stairs, and locomotion level  PARTICIPATION LIMITATIONS:  affects all ADLs  PERSONAL FACTORS: Fitness and 3+ comorbidities: CKD, DM, HTN, depression, neuropathy abdomen and thighs, lumbar fusion, CVA, severe obesity  are also affecting patient's functional outcome.   REHAB POTENTIAL: Good  CLINICAL DECISION MAKING: Evolving/moderate complexity  EVALUATION COMPLEXITY: Moderate   GOALS: Goals reviewed with patient? Yes  SHORT TERM GOALS: Target date: 01/24/2022  Remove Blue Hyperlink)  Patient will be independent with  initial HEP. Baseline:  Goal status: MET    LONG TERM GOALS: Target date: 02/22/2022 (Remove Blue Hyperlink)  Patient will be independent with advanced/ongoing HEP to improve outcomes and carryover.  Baseline:  Goal status: INITIAL  2.  Patient will report at least 75% improvement in bil leg pain to improve QOL and allow for improved gait mechanics. Baseline:  Goal status: INITIAL  3.  Patient will demonstrate negative nerve tension tests in BLE. Baseline:  Goal status: INITIAL  4.  Patient will  demonstrate improved LE strength to 5/5 to ease transfers. Baseline:  Goal status: INITIAL  5.  Patient will report improved tolerance to standing and walking by 25-50% to improve access to community.  Baseline:  Goal status: INITIAL  6. Patient will report no spasms in R hamstrings with activity indicating improved flexibility. Baseline:  Goal status: INITIAL  7.  Patient will report >= 26/80 on LEFS  to demonstrate improved functional ability. Baseline: 17 / 80 = 21.3 % Goal status: INITIAL    PLAN:  PT FREQUENCY: 2x/week  PT DURATION: 6 weeks  PLANNED INTERVENTIONS: Therapeutic exercises, Therapeutic activity, Neuromuscular re-education, Balance training, Gait training, Patient/Family education, Self Care, Joint mobilization, Aquatic Therapy, Dry Needling, Electrical stimulation, Spinal mobilization, Cryotherapy, Moist heat, Taping, Ultrasound, Ionotophoresis 23m/ml Dexamethasone, and Manual therapy  PLAN FOR NEXT SESSION: General conditioning and LE strengthening, DN and/or manual therapy to R hamstrings,bil hip Adductors, gentle HS strengthening and bil HS/quad flexibility. Gait and balance as indicated.  JKerin Perna PTA 01/30/22 9:12 AM [facility] [address] [address]Phone: [phone]  Fax:  [phone]

## 2022-01-31 ENCOUNTER — Encounter: Payer: Medicare PPO | Admitting: Physical Therapy

## 2022-02-03 ENCOUNTER — Ambulatory Visit: Payer: Medicare PPO

## 2022-02-03 DIAGNOSIS — M79604 Pain in right leg: Secondary | ICD-10-CM | POA: Diagnosis not present

## 2022-02-03 DIAGNOSIS — M79605 Pain in left leg: Secondary | ICD-10-CM

## 2022-02-03 DIAGNOSIS — R2689 Other abnormalities of gait and mobility: Secondary | ICD-10-CM

## 2022-02-03 DIAGNOSIS — R252 Cramp and spasm: Secondary | ICD-10-CM

## 2022-02-03 NOTE — Therapy (Signed)
OUTPATIENT PHYSICAL THERAPY TREATMENT   Patient Name: Paul Adkins MRN: [id number] DOB:1945-08-05, 76 y.o., male Today's Date: 02/03/2022   PT End of Session - 02/03/22 0934     Visit Number 6    Number of Visits 12    Date for PT Re-Evaluation 02/22/22    Authorization Type [employer] MCR    Authorization Time Period 01/11/22-02/22/22    Authorization - Visit Number 5    Authorization - Number of Visits 12    Progress Note Due on Visit 10    PT Start Time 0848    PT Stop Time 0932    PT Time Calculation (min) 44 min    Activity Tolerance Patient tolerated treatment well    Behavior During Therapy [facility] for tasks assessed/performed                Past Medical History:  Diagnosis Date   Carpal tunnel syndrome    Chronic kidney disease    Depression    Diabetes mellitus without complication ([place])    ED (erectile dysfunction)    Edema    Hyperlipidemia    Hypertension    Lumbar disc disease    Neuropathy    Sleep apnea    Stroke [facility])    Past Surgical History:  Procedure Laterality Date   BACK SURGERY     VASECTOMY     Patient Active Problem List   Diagnosis Date Noted   Conjunctivitis 09/19/2018   Obstructive sleep apnea 08/26/2013   Type 1 diabetes mellitus with complications ([place]) 82/50/5397   Essential hypertension, benign 09/04/2012   Hypogonadism male 09/04/2012   Depression 09/04/2012   Pure hypercholesterolemia 09/04/2012   Severe obesity (BMI >= 40) ([place]) 09/04/2012    PCP: Marrian Salvage, FNP   REFERRING PROVIDER: Marrian Salvage, FNP   REFERRING DIAG: [phone] (ICD-10-CM) - Right leg pain   THERAPY DIAG:  Pain in right leg  Pain in left leg  Other abnormalities of gait and mobility  Cramp and spasm  Rationale for Evaluation and Treatment: Rehabilitation  ONSET DATE: 3-4 months ago  SUBJECTIVE:   SUBJECTIVE STATEMENT: Doing good, wasn't really sore after aquatic therapy visit. He is noticing a better tolerance for  gait with less pain but he still can't walk for a long time.  PERTINENT HISTORY: CKD, DM, HTN, depression, neuropathy abdomen and thighs, lumbar fusion, CVA, severe obesity PAIN:  Are you having pain? No  PRECAUTIONS: None  WEIGHT BEARING RESTRICTIONS: No  FALLS:  Has patient fallen in last 6 months? 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 No DVT, thrombosis.  PATIENT SURVEYS:  LEFS 17 / 80 = 21.3 %  COGNITION: Overall cognitive status: Within functional limits for tasks assessed     SENSATION: Light touch: Impaired  and bil outer thighs  EDEMA: Reports significant edema; hasn't been determined why.   MUSCLE LENGTH: [id number] less than 45 deg bil Quads:mod tightness bil ITB: NT Piriformis:mod tightness Hip Flexors:NT Heelcords: WNL   POSTURE: No Significant postural limitations  PALPATION: Palpation: TTP at post R knee and HS. Increased tissue tension in right HS  LUMBAR ROM:   Active  A/PROM  eval  Flexion Limited by tight HS (hands to knees)  Extension WNL some pain in low back  Right lateral flexion full  Left lateral flexion full  Right rotation   Left rotation    (Blank rows = not tested)    LOWER EXTREMITY MMT:   ROM Right eval Left eval Right 01/24/22 Left 01/24/22  Hip flexion 4+ 4-    Hip extension 4+ 4+ 5 4+  Hip abduction 5 4+    Hip adduction 5 5    Hip internal rotation      Hip external rotation      Knee flexion 4+ (mild pain behind knee) 5    Knee extension 5 5    Ankle dorsiflexion 5 5     (Blank rows = not tested)  LOWER EXTREMITY ROM: WFL (limited by decreased flexibility and nerve  tension)  GAIT: Distance walked: 40 Assistive device utilized: None Level of assistance: Modified independence Comments: Short step and stride lengths, bil hip ER, wide stance    TODAY'S TREATMENT:                                                                                                                              DATE:    02/03/22 Therapeutic Exercise: to improve strength and mobility.  Demo, verbal and tactile cues throughout for technique.  Nustep L4x58mn - frequent breaks d/t SOB  Knee flexion 10lb x 10 BLE Knee extension 5lb 2x5 BLE Forward step and back x 10 BLE - 1 hand support (rest required after) Supine Leg lengthener x 10 5 sec hold Supine LTR x 10 both ways Supine heel slides x 10 BLE - c/o wanting to cramp in R hip adductors  01/27/22 Therapeutic Exercise: to improve strength and mobility.  Demo, verbal and tactile cues throughout for technique.  Nustep L2x512m - frequent breaks d/t SOB : SpO2 96% at rest Supine LTR 5x for 5 sec holds Supine march 5x BLE Supine isometric hip ext against mat 2x5 BLE Passive hamstring stretch 3x10" BLE Seated hip up and over with blue TB x 10 BLE Forward stepping x 10 BLE - pre-gait activity  01/24/22 Therapeutic Exercise: to improve strength and mobility.  Demo, verbal and tactile cues throughout for technique.  Nustep L2x5m68m- limited by SOB and leg fatigue. O2 97 and HR 87 bpm after 4 min Attempted prone lying but difficulty with breathing. SDLY hip ABD L 2x5 with PT pressure to stop from rolling back, R 2x5. Supine LTR 5 reps both ways Supine SLR 2 x 5 bil Seated marching 3x5 Seated nerve glide x 10 each side 5 sec hold (knee ext) Seated piriformis stretch bil 2 x 30 sec     01/17/22 Therapeutic Exercise: to improve strength and mobility.  Demo, verbal and tactile cues throughout for technique.  Nustep L4x5mi18m limited by SOB Seated hamstring stretch 2x30 sec BLE Standing hip flexion x 10 BLE Standing hip abd  and ext -  limited by pain Seated hip ADD ball squeeze 5x5" Seated march x 5 BLE Seated hip ABD blue TB x 5 BLE Supine LTR 5 reps both ways Supine heel slides x 10    01/11/22 See pt ed  PATIENT EDUCATION:  Education details: HEP update  Person educated: Patient Education method: Explanation, Demonstration, and Handouts Education comprehension: verbalized understanding and returned demonstration  HOME EXERCISE PROGRAM: Access Code: [email or web address] URL: [email or web address] Date: 01/24/2022 Prepared by: Almyra Free  Exercises - Standing Hip Flexion with Counter Support  - 2 x daily - 7 x weekly - 1 sets - 10 reps - Seated Hamstring Stretch  - 2 x daily - 7 x weekly - 1 sets - 3 reps - 30-60 sec hold - Seated March  - 1 x daily - 7 x weekly - 2 sets - 5 reps - Seated Hip Abduction with Resistance  - 1 x daily - 7 x weekly - 2 sets - 5 reps - Supine Heel Slide  - 1 x daily - 7 x weekly - 2 sets - 10 reps - Supine Lower Trunk Rotation  - 1 x daily - 7 x weekly - 2 sets - 5 reps - Seated Long Arc Quad  - 1 x daily - 7 x weekly - 3 sets - 10 reps - 5 sec hold - Seated Table Piriformis Stretch  - 2 x daily - 7 x weekly - 1 sets - 3 reps - 30-60 sec hold  ASSESSMENT:  CLINICAL IMPRESSION: Pt responded well to the progression of exercises. We added in weight machines and increased repetitions with exercises to build endurance. With the standing stepping exercise he had to lie supine to relieve LBP. With the supine heel slides he reported his R ADD wanting to cramp. We spoke post session about trying some STM or DN next visit to address the muscle tension found.    OBJECTIVE IMPAIRMENTS: decreased activity tolerance, decreased endurance, difficulty walking, decreased ROM, decreased strength, increased edema, increased muscle spasms, impaired flexibility, impaired sensation, obesity, and pain.   ACTIVITY LIMITATIONS: standing, stairs, and locomotion level  PARTICIPATION LIMITATIONS:   affects all ADLs  PERSONAL FACTORS: Fitness and 3+ comorbidities: CKD, DM, HTN, depression, neuropathy abdomen and thighs, lumbar fusion, CVA, severe obesity  are also affecting patient's functional outcome.   REHAB POTENTIAL: Good  CLINICAL DECISION MAKING: Evolving/moderate complexity  EVALUATION COMPLEXITY: Moderate   GOALS: Goals reviewed with patient? Yes  SHORT TERM GOALS: Target date: 01/24/2022  Remove Blue Hyperlink)  Patient will be independent with initial HEP. Baseline:  Goal status: MET    LONG TERM GOALS: Target date: 02/22/2022 (Remove Blue Hyperlink)  Patient will be independent with advanced/ongoing HEP to improve outcomes and carryover.  Baseline:  Goal status: INITIAL  2.  Patient will report at least 75% improvement in bil leg pain to improve QOL and allow for improved gait mechanics. Baseline:  Goal status: INITIAL  3.  Patient will demonstrate negative nerve tension tests in BLE. Baseline:  Goal status: INITIAL  4.  Patient will demonstrate improved LE strength to 5/5 to ease transfers. Baseline:  Goal status: INITIAL  5.  Patient will report improved tolerance to standing and walking by 25-50% to improve access to community.  Baseline:  Goal status: INITIAL  6. Patient will report no spasms in R hamstrings with activity indicating improved flexibility. Baseline:  Goal status: INITIAL  7.  Patient will report >= 26/80 on LEFS  to demonstrate improved functional ability. Baseline: 17 /  80 = 21.3 % Goal status: INITIAL    PLAN:  PT FREQUENCY: 2x/week  PT DURATION: 6 weeks  PLANNED INTERVENTIONS: Therapeutic exercises, Therapeutic activity, Neuromuscular re-education, Balance training, Gait training, Patient/Family education, Self Care, Joint mobilization, Aquatic Therapy, Dry Needling, Electrical stimulation, Spinal mobilization, Cryotherapy, Moist heat, Taping, Ultrasound, Ionotophoresis 59m/ml Dexamethasone, and Manual  therapy  PLAN FOR NEXT SESSION: try manual or DN to R hip ADD and hamstrings; General conditioning and LE strengthening, gentle HS strengthening and bil HS/quad flexibility. Gait and balance as indicated.   BArtist Pais PTA 02/03/2022, 9:37 AM

## 2022-02-07 ENCOUNTER — Encounter (HOSPITAL_BASED_OUTPATIENT_CLINIC_OR_DEPARTMENT_OTHER): Payer: Self-pay | Admitting: Physical Therapy

## 2022-02-07 ENCOUNTER — Encounter: Payer: Medicare PPO | Admitting: Physical Therapy

## 2022-02-07 ENCOUNTER — Ambulatory Visit (HOSPITAL_BASED_OUTPATIENT_CLINIC_OR_DEPARTMENT_OTHER): Payer: Medicare PPO | Admitting: Physical Therapy

## 2022-02-07 DIAGNOSIS — M79605 Pain in left leg: Secondary | ICD-10-CM

## 2022-02-07 DIAGNOSIS — R2689 Other abnormalities of gait and mobility: Secondary | ICD-10-CM

## 2022-02-07 DIAGNOSIS — M79604 Pain in right leg: Secondary | ICD-10-CM

## 2022-02-07 DIAGNOSIS — R252 Cramp and spasm: Secondary | ICD-10-CM

## 2022-02-07 NOTE — Therapy (Signed)
OUTPATIENT PHYSICAL THERAPY TREATMENT   Patient Name: Paul Adkins MRN: [id number] DOB:1945-05-05, 76 y.o., male Today's Date: 02/07/2022   PT End of Session - 02/07/22 0735     Visit Number 7    Number of Visits 12    Date for PT Re-Evaluation 02/22/22    Authorization Type [employer] MCR    Authorization Time Period 01/11/22-02/22/22    Authorization - Visit Number 7    Authorization - Number of Visits 12    Progress Note Due on Visit 10    PT Start Time 9562    PT Stop Time 0811    PT Time Calculation (min) 40 min    Activity Tolerance Patient tolerated treatment well    Behavior During Therapy [facility] for tasks assessed/performed                Past Medical History:  Diagnosis Date   Carpal tunnel syndrome    Chronic kidney disease    Depression    Diabetes mellitus without complication ([place])    ED (erectile dysfunction)    Edema    Hyperlipidemia    Hypertension    Lumbar disc disease    Neuropathy    Sleep apnea    Stroke [facility])    Past Surgical History:  Procedure Laterality Date   BACK SURGERY     VASECTOMY     Patient Active Problem List   Diagnosis Date Noted   Conjunctivitis 09/19/2018   Obstructive sleep apnea 08/26/2013   Type 1 diabetes mellitus with complications ([place]) 13/09/6576   Essential hypertension, benign 09/04/2012   Hypogonadism male 09/04/2012   Depression 09/04/2012   Pure hypercholesterolemia 09/04/2012   Severe obesity (BMI >= 40) ([place]) 09/04/2012    PCP: Marrian Salvage, FNP   REFERRING PROVIDER: Marrian Salvage, FNP   REFERRING DIAG: [phone] (ICD-10-CM) - Right leg pain   THERAPY DIAG:  Pain in right leg  Pain in left leg  Other abnormalities of gait and mobility  Cramp and spasm  Rationale for Evaluation and Treatment: Rehabilitation  ONSET DATE: 3-4 months ago  SUBJECTIVE:   SUBJECTIVE STATEMENT: Pt reports he wasn't sore after first aquatic therapy visit. "Make my legs more tired."    PERTINENT HISTORY: CKD, DM, HTN, depression, neuropathy abdomen and thighs, lumbar fusion, CVA, severe obesity PAIN:  Are you having pain? No  PRECAUTIONS: None  WEIGHT BEARING RESTRICTIONS: No  FALLS:  Has patient fallen in last 6 months? 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 No DVT, thrombosis.  PATIENT SURVEYS:  LEFS 17 / 80 = 21.3 %  COGNITION: Overall cognitive status: Within functional limits for tasks assessed     SENSATION: Light touch: Impaired  and bil outer thighs  EDEMA: Reports significant edema; hasn't been determined why.   MUSCLE LENGTH: BM:WUXLKG less than 45 deg bil Quads:mod tightness bil ITB: NT Piriformis:mod tightness Hip Flexors:NT Heelcords: WNL   POSTURE: No Significant postural limitations  PALPATION: Palpation: TTP at post R knee and HS. Increased tissue tension in right HS  LUMBAR ROM:   Active  A/PROM  eval  Flexion Limited by tight HS (hands to knees)  Extension WNL some pain  in low back  Right lateral flexion full  Left lateral flexion full  Right rotation   Left rotation    (Blank rows = not tested)    LOWER EXTREMITY MMT:   ROM Right eval Left eval Right 01/24/22 Left 01/24/22  Hip flexion 4+ 4-    Hip extension 4+ 4+ 5 4+  Hip abduction 5 4+    Hip adduction 5 5    Hip internal rotation      Hip external rotation      Knee flexion 4+ (mild pain behind knee) 5    Knee extension 5 5    Ankle dorsiflexion 5 5     (Blank rows = not tested)  LOWER EXTREMITY ROM: WFL (limited by decreased flexibility and nerve tension)  GAIT: Distance walked: 40 Assistive device utilized:  None Level of assistance: Modified independence Comments: Short step and stride lengths, bil hip ER, wide stance    TODAY'S TREATMENT:                                                                                                                              DATE:     02/07/22 Pt seen for aquatic therapy today.  Treatment took place in water 3.25-4.5 ft in depth at the [facility]. Temp of water was 91.  Pt entered/exited the pool via stairs with supervision and with bilat rail.    * holding wall:  side stepping R/L; heel/toe raises x 10; hip abdct/ addct x 5 x 2; hamstring curls x 10;  hip circles x 10 each; leg swings forward/ backward * holding wall with one hand and yellow hand float with other hand: forward walking  * progressed to walking forward with hands on white barbell at ~4 ft depth 2 laps * seated on bench in water:  alternating LAQ with DF; ankle circles  --- pt exited pool to check blood sugar -- 111 mg/dL --- pt re-entered pool * walking with hands on white barbell at 4 ft, working on even step length and varying speed   Pt requires the buoyancy and hydrostatic pressure of water for support, and to offload joints by unweighting joint load by at least 50 % in navel deep water and by at least 75-80% in chest to neck deep water.  Viscosity of the water is needed for resistance of strengthening. Water current perturbations provides challenge to standing balance requiring increased core activation.   02/03/22 Therapeutic Exercise: to improve strength and mobility.  Demo, verbal and tactile cues throughout for technique.  Nustep L4x59mn - frequent breaks d/t SOB  Knee flexion 10lb x 10 BLE Knee extension 5lb 2x5 BLE Forward step and back x 10 BLE - 1 hand support (rest required after) Supine Leg lengthener x 10 5 sec hold Supine LTR x 10 both ways Supine heel slides x 10 BLE - c/o wanting to cramp in R hip adductors  PATIENT EDUCATION:  Education details:  HEP  update  Person educated: Patient Education method: Explanation, Demonstration, and Handouts Education comprehension: verbalized understanding and returned demonstration  HOME EXERCISE PROGRAM: Access Code: [email or web address] URL: [email or web address] Date: 01/24/2022 Prepared by: Almyra Free  Exercises - Standing Hip Flexion with Counter Support  - 2 x daily - 7 x weekly - 1 sets - 10 reps - Seated Hamstring Stretch  - 2 x daily - 7 x weekly - 1 sets - 3 reps - 30-60 sec hold - Seated March  - 1 x daily - 7 x weekly - 2 sets - 5 reps - Seated Hip Abduction with Resistance  - 1 x daily - 7 x weekly - 2 sets - 5 reps - Supine Heel Slide  - 1 x daily - 7 x weekly - 2 sets - 10 reps - Supine Lower Trunk Rotation  - 1 x daily - 7 x weekly - 2 sets - 5 reps - Seated Long Arc Quad  - 1 x daily - 7 x weekly - 3 sets - 10 reps - 5 sec hold - Seated Table Piriformis Stretch  - 2 x daily - 7 x weekly - 1 sets - 3 reps - 30-60 sec hold  ASSESSMENT:  CLINICAL IMPRESSION: Pt was able to progress to walking with hands on white barbell away from wall today.  He is still somewhat guarded when away from wall due to unsteadiness with gait in water, but it is much improved from last visit.  He is observed with slight posterior lean with swing through of RLE.  Lt ankle / calf is very tight; limited tolerance for step down of stairs with RLE.  May benefit from land calf stretches (gastroc AND soleus) next visit for HEP.  Pt able to complete increased volume of exercise today before needing short seated rest break at bench in water.  Blood glucose level was also checked (via his monitor, on deck) during session for safety.  Goals are ongoing.    OBJECTIVE IMPAIRMENTS: decreased activity tolerance, decreased endurance, difficulty walking, decreased ROM, decreased strength, increased edema, increased muscle spasms, impaired flexibility, impaired sensation, obesity, and pain.   ACTIVITY LIMITATIONS: standing,  stairs, and locomotion level  PARTICIPATION LIMITATIONS:  affects all ADLs  PERSONAL FACTORS: Fitness and 3+ comorbidities: CKD, DM, HTN, depression, neuropathy abdomen and thighs, lumbar fusion, CVA, severe obesity  are also affecting patient's functional outcome.   REHAB POTENTIAL: Good  CLINICAL DECISION MAKING: Evolving/moderate complexity  EVALUATION COMPLEXITY: Moderate   GOALS: Goals reviewed with patient? Yes  SHORT TERM GOALS: Target date: 01/24/2022  Remove Blue Hyperlink)  Patient will be independent with initial HEP. Baseline:  Goal status: MET    LONG TERM GOALS: Target date: 02/22/2022 (Remove Blue Hyperlink)  Patient will be independent with advanced/ongoing HEP to improve outcomes and carryover.  Baseline:  Goal status: INITIAL  2.  Patient will report at least 75% improvement in bil leg pain to improve QOL and allow for improved gait mechanics. Baseline:  Goal status: INITIAL  3.  Patient will demonstrate negative nerve tension tests in BLE. Baseline:  Goal status: INITIAL  4.  Patient will demonstrate improved LE strength to 5/5 to ease transfers. Baseline:  Goal status: INITIAL  5.  Patient will report improved tolerance to standing and walking by 25-50% to improve access to community.  Baseline:  Goal status: INITIAL  6. Patient will report no spasms in R hamstrings with activity indicating improved flexibility. Baseline:  Goal status: INITIAL  7.  Patient  will report >= 26/80 on LEFS  to demonstrate improved functional ability. Baseline: 17 / 80 = 21.3 % Goal status: INITIAL    PLAN:  PT FREQUENCY: 2x/week  PT DURATION: 6 weeks  PLANNED INTERVENTIONS: Therapeutic exercises, Therapeutic activity, Neuromuscular re-education, Balance training, Gait training, Patient/Family education, Self Care, Joint mobilization, Aquatic Therapy, Dry Needling, Electrical stimulation, Spinal mobilization, Cryotherapy, Moist heat, Taping, Ultrasound,  Ionotophoresis 76m/ml Dexamethasone, and Manual therapy  PLAN FOR NEXT SESSION: try manual or DN to R hip ADD and hamstrings; General conditioning and LE strengthening, gentle HS strengthening and bil HS/quad flexibility. Gait and balance as indicated.  JKerin Perna PTA 02/07/22 11:49 AM [facility] [address]GBrigham [place] [address]Phone: [phone]  Fax:  [phone]

## 2022-02-09 ENCOUNTER — Ambulatory Visit (HOSPITAL_BASED_OUTPATIENT_CLINIC_OR_DEPARTMENT_OTHER): Payer: Medicare PPO | Admitting: Physical Therapy

## 2022-02-11 ENCOUNTER — Other Ambulatory Visit: Payer: Self-pay | Admitting: Endocrinology

## 2022-02-13 NOTE — Therapy (Signed)
OUTPATIENT PHYSICAL THERAPY TREATMENT   Patient Name: Paul Adkins MRN: [id number] DOB:[birth year], 76 y.o., male Today's Date: 02/14/2022   PT End of Session - 02/14/22 0840     Visit Number 8    Number of Visits 12    Date for PT Re-Evaluation 02/22/22    Authorization Type [employer] MCR    Authorization Time Period 01/11/22-02/22/22    Authorization - Visit Number 8    Authorization - Number of Visits 12    Progress Note Due on Visit 10    PT Start Time 0840    PT Stop Time 0936    PT Time Calculation (min) 56 min    Activity Tolerance Patient tolerated treatment well    Behavior During Therapy [facility] for tasks assessed/performed                Past Medical History:  Diagnosis Date   Carpal tunnel syndrome    Chronic kidney disease    Depression    Diabetes mellitus without complication ([place])    ED (erectile dysfunction)    Edema    Hyperlipidemia    Hypertension    Lumbar disc disease    Neuropathy    Sleep apnea    Stroke [facility])    Past Surgical History:  Procedure Laterality Date   BACK SURGERY     VASECTOMY     Patient Active Problem List   Diagnosis Date Noted   Conjunctivitis 09/19/2018   Obstructive sleep apnea 08/26/2013   Type 1 diabetes mellitus with complications ([place]) 79/39/0300   Essential hypertension, benign 09/04/2012   Hypogonadism male 09/04/2012   Depression 09/04/2012   Pure hypercholesterolemia 09/04/2012   Severe obesity (BMI >= 40) ([place]) 09/04/2012    PCP: Marrian Salvage, [place]   REFERRING PROVIDER: Marrian Salvage, FNP   REFERRING DIAG: [phone] (ICD-10-CM) - Right leg pain   THERAPY DIAG:  Pain in right leg  Pain in left leg  Other abnormalities of gait and mobility  Cramp and spasm  Rationale for Evaluation and Treatment: Rehabilitation  ONSET DATE: 3-4 months ago  SUBJECTIVE:   SUBJECTIVE STATEMENT: Patient reports he feels a constant heaviness in his right leg now where before he was always  just fighting cramping by taking tiny steps.  PERTINENT HISTORY: CKD, DM, HTN, depression, neuropathy abdomen and thighs, lumbar fusion, CVA, severe obesity PAIN:  Are you having pain? No  PRECAUTIONS: None  WEIGHT BEARING RESTRICTIONS: No  FALLS:  Has patient fallen in last 6 months? 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 No DVT, thrombosis.  PATIENT SURVEYS:  LEFS 17 / 80 = 21.3 %  COGNITION: Overall cognitive status: Within functional limits for tasks assessed     SENSATION: Light touch: Impaired  and bil outer thighs  EDEMA: Reports significant edema; hasn't been determined why.   MUSCLE LENGTH: TM:AUQJFH less than 45 deg bil Quads:mod tightness bil ITB: NT Piriformis:mod tightness Hip Flexors:NT Heelcords: WNL   POSTURE: No Significant postural limitations  PALPATION: Palpation: TTP at post R knee and HS. Increased tissue tension in right HS  LUMBAR ROM:   Active  A/PROM  eval  Flexion Limited by tight HS (  hands to knees)  Extension WNL some pain in low back  Right lateral flexion full  Left lateral flexion full  Right rotation   Left rotation    (Blank rows = not tested)    LOWER EXTREMITY MMT:   ROM Right eval Left eval Right 01/24/22 Left 01/24/22  Hip flexion 4+ 4-    Hip extension 4+ 4+ 5 4+  Hip abduction 5 4+    Hip adduction 5 5    Hip internal rotation      Hip external rotation      Knee flexion 4+ (mild pain behind knee) 5    Knee extension 5 5    Ankle dorsiflexion 5 5     (Blank rows = not tested)  LOWER EXTREMITY ROM: WFL (limited by decreased flexibility and nerve tension)  GAIT: Distance  walked: 40 Assistive device utilized: None Level of assistance: Modified independence Comments: Short step and stride lengths, bil hip ER, wide stance    TODAY'S TREATMENT:                                                                                                                              DATE:     02/14/22 Therapeutic Exercise: to improve strength and mobility.  Demo, verbal and tactile cues throughout for technique.  Nustep L5x45mn  no break today  Standing lumbar ext at wall x 5  (increases heaviness in his legs) Attempted Self traction in hooklying with foam roller on lap to press into - no change noted.  Self traction at sink - increased pain with forward flexion  The following TE was done after Manual and Self Care below:  Seated TrAb contraction 5 sec hold x 10 Seated pelvic rocking flex/ext x 10 on dyna disc Seated hip flexion with TrAb x 5 ea Seated ABD with TrAb with GTB x 5 Then did hookliying TrAB, hip flexion and clam with no band (these issued for HEP)  Manual Traction in hooklying with belt just distal to knee joints 3 x 30 sec - decreases R LE heaviness, but eventually brings on upper thigh pain.   Self Care: We discussed his previous L3/4, L4/5 fusions (20 years ago) and that many times the joints above and below become affected. He reports that the MD at the time told him L2 was not in good shape either but didn't need surgery at the time. Education to pt regarding the spine, nerve distributions, discs and associated anatomy for better understanding of why his thighs were hurting and the feelings of heaviness.      02/07/22 Pt seen for aquatic therapy today.  Treatment took place in water 3.25-4.5 ft in depth at the [facility] Temp of water was 91.  Pt entered/exited the pool via stairs with supervision and with bilat rail.    * holding wall:  side stepping R/L; heel/toe raises x 10; hip abdct/ addct x 5 x 2; hamstring curls  x 10;  hip  circles x 10 each; leg swings forward/ backward * holding wall with one hand and yellow hand float with other hand: forward walking  * progressed to walking forward with hands on white barbell at ~4 ft depth 2 laps * seated on bench in water:  alternating LAQ with DF; ankle circles  --- pt exited pool to check blood sugar -- 111 mg/dL --- pt re-entered pool * walking with hands on white barbell at 4 ft, working on even step length and varying speed   Pt requires the buoyancy and hydrostatic pressure of water for support, and to offload joints by unweighting joint load by at least 50 % in navel deep water and by at least 75-80% in chest to neck deep water.  Viscosity of the water is needed for resistance of strengthening. Water current perturbations provides challenge to standing balance requiring increased core activation.   02/03/22 Therapeutic Exercise: to improve strength and mobility.  Demo, verbal and tactile cues throughout for technique.  Nustep L4x60mn - frequent breaks d/t SOB  Knee flexion 10lb x 10 BLE Knee extension 5lb 2x5 BLE Forward step and back x 10 BLE - 1 hand support (rest required after) Supine Leg lengthener x 10 5 sec hold Supine LTR x 10 both ways Supine heel slides x 10 BLE - c/o wanting to cramp in R hip adductors  PATIENT EDUCATION:  Education details: HEP update  Person educated: Patient Education method: Explanation, Demonstration, and Handouts Education comprehension: verbalized understanding and returned demonstration  HOME EXERCISE PROGRAM: Access Code: [email or web address] URL: [email or web address] Date: 02/14/2022 Prepared by: JAlmyra Free Exercises - Standing Hip Flexion with Counter Support  - 2 x daily - 7 x weekly - 1 sets - 10 reps - Seated Hamstring Stretch  - 2 x daily - 7 x weekly - 1 sets - 3 reps - 30-60 sec hold - Seated March  - 1 x daily - 7 x weekly - 2 sets - 5 reps - Seated Hip Abduction with Resistance  - 1 x daily - 7 x weekly - 2  sets - 5 reps - Supine Heel Slide  - 1 x daily - 7 x weekly - 2 sets - 10 reps - Supine Lower Trunk Rotation  - 1 x daily - 7 x weekly - 2 sets - 5 reps - Seated Long Arc Quad  - 1 x daily - 7 x weekly - 3 sets - 10 reps - 5 sec hold - Seated Table Piriformis Stretch  - 2 x daily - 7 x weekly - 1 sets - 3 reps - 30-60 sec hold - Hooklying Transversus Abdominis Palpation  - 2 x daily - 7 x weekly - 1-2 sets - 10 reps -  5 sec hold - Supine March  - 1 x daily - 7 x weekly - 2 sets - 10 reps - 3 sec hold - Supine Transversus Abdominis Bracing with Double Leg Fallout  - 1 x daily - 7 x weekly - 2 sets - 10 reps - 3 sec hold  ASSESSMENT:  CLINICAL IMPRESSION: Paul Adkins reports that he wants to cancel aquatic PT due to needing to go without his glucose monitor while in the pool. He will continue his land PT until the end of his POC. Long discussion regarding his spinal fusion and likelihood that sx are from his low back. PT recommended he f/u with PCP to get referral to spine specialist. Remaining visits should focus on core strengthening to  see if he makes any gains with symptom control. He got temporary relief with gentle manual traction but then with release had pain in L2, L3 distribution. Needs ongoing cueing for TrAb so as not to engage obliques as well.    OBJECTIVE IMPAIRMENTS: decreased activity tolerance, decreased endurance, difficulty walking, decreased ROM, decreased strength, increased edema, increased muscle spasms, impaired flexibility, impaired sensation, obesity, and pain.   ACTIVITY LIMITATIONS: standing, stairs, and locomotion level  PARTICIPATION LIMITATIONS:  affects all ADLs  PERSONAL FACTORS: Fitness and 3+ comorbidities: CKD, DM, HTN, depression, neuropathy abdomen and thighs, lumbar fusion, CVA, severe obesity  are also affecting patient's functional outcome.   REHAB POTENTIAL: Good  CLINICAL DECISION MAKING: Evolving/moderate complexity  EVALUATION COMPLEXITY:  Moderate   GOALS: Goals reviewed with patient? Yes  SHORT TERM GOALS: Target date: 01/24/2022  Remove Blue Hyperlink)  Patient will be independent with initial HEP. Baseline:  Goal status: MET    LONG TERM GOALS: Target date: 02/22/2022 (Remove Blue Hyperlink)  Patient will be independent with advanced/ongoing HEP to improve outcomes and carryover.  Baseline:  Goal status: INITIAL  2.  Patient will report at least 75% improvement in bil leg pain to improve QOL and allow for improved gait mechanics. Baseline:  Goal status: INITIAL  3.  Patient will demonstrate negative nerve tension tests in BLE. Baseline:  Goal status: INITIAL  4.  Patient will demonstrate improved LE strength to 5/5 to ease transfers. Baseline:  Goal status: INITIAL  5.  Patient will report improved tolerance to standing and walking by 25-50% to improve access to community.  Baseline:  Goal status: INITIAL  6. Patient will report no spasms in R hamstrings with activity indicating improved flexibility. Baseline:  Goal status: INITIAL  7.  Patient will report >= 26/80 on LEFS  to demonstrate improved functional ability. Baseline: 17 / 80 = 21.3 % Goal status: INITIAL    PLAN:  PT FREQUENCY: 2x/week  PT DURATION: 6 weeks  PLANNED INTERVENTIONS: Therapeutic exercises, Therapeutic activity, Neuromuscular re-education, Balance training, Gait training, Patient/Family education, Self Care, Joint mobilization, Aquatic Therapy, Dry Needling, Electrical stimulation, Spinal mobilization, Cryotherapy, Moist heat, Taping, Ultrasound, Ionotophoresis 57m/ml Dexamethasone, and Manual therapy  PLAN FOR NEXT SESSION: Focus on core strength  Kamori Kitchens, PT 02/14/2022 10:02 AM

## 2022-02-14 ENCOUNTER — Encounter: Payer: Self-pay | Admitting: Physical Therapy

## 2022-02-14 ENCOUNTER — Ambulatory Visit: Payer: Medicare PPO | Admitting: Physical Therapy

## 2022-02-14 DIAGNOSIS — M79604 Pain in right leg: Secondary | ICD-10-CM

## 2022-02-14 DIAGNOSIS — M79605 Pain in left leg: Secondary | ICD-10-CM

## 2022-02-14 DIAGNOSIS — R252 Cramp and spasm: Secondary | ICD-10-CM

## 2022-02-14 DIAGNOSIS — R2689 Other abnormalities of gait and mobility: Secondary | ICD-10-CM

## 2022-02-16 ENCOUNTER — Ambulatory Visit (HOSPITAL_BASED_OUTPATIENT_CLINIC_OR_DEPARTMENT_OTHER): Payer: Medicare PPO | Admitting: Physical Therapy

## 2022-02-16 ENCOUNTER — Ambulatory Visit: Payer: Medicare PPO

## 2022-02-16 ENCOUNTER — Encounter (HOSPITAL_BASED_OUTPATIENT_CLINIC_OR_DEPARTMENT_OTHER): Payer: Self-pay

## 2022-02-16 DIAGNOSIS — R252 Cramp and spasm: Secondary | ICD-10-CM

## 2022-02-16 DIAGNOSIS — M79605 Pain in left leg: Secondary | ICD-10-CM

## 2022-02-16 DIAGNOSIS — M79604 Pain in right leg: Secondary | ICD-10-CM

## 2022-02-16 DIAGNOSIS — R2689 Other abnormalities of gait and mobility: Secondary | ICD-10-CM

## 2022-02-16 NOTE — Therapy (Signed)
OUTPATIENT PHYSICAL THERAPY TREATMENT   Patient Name: Paul Adkins MRN: [id number] DOB:January 25, 1946, 76 y.o., male Today's Date: 02/16/2022   PT End of Session - 02/16/22 1622     Visit Number 9    Number of Visits 12    Date for PT Re-Evaluation 02/22/22    Authorization Type [employer] MCR    Authorization Time Period 01/11/22-02/22/22    Authorization - Visit Number 9    Authorization - Number of Visits 12    Progress Note Due on Visit 10    PT Start Time 4098    PT Stop Time 1191    PT Time Calculation (min) 44 min    Activity Tolerance Patient tolerated treatment well    Behavior During Therapy WFL for tasks assessed/performed                Past Medical History:  Diagnosis Date   Carpal tunnel syndrome    Chronic kidney disease    Depression    Diabetes mellitus without complication ([place])    ED (erectile dysfunction)    Edema    Hyperlipidemia    Hypertension    Lumbar disc disease    Neuropathy    Sleep apnea    Stroke [facility])    Past Surgical History:  Procedure Laterality Date   BACK SURGERY     VASECTOMY     Patient Active Problem List   Diagnosis Date Noted   Conjunctivitis 09/19/2018   Obstructive sleep apnea 08/26/2013   Type 1 diabetes mellitus with complications ([place]) 47/82/9562   Essential hypertension, benign 09/04/2012   Hypogonadism male 09/04/2012   Depression 09/04/2012   Pure hypercholesterolemia 09/04/2012   Severe obesity (BMI >= 40) ([place]) 09/04/2012    PCP: Marrian Salvage, [place]   REFERRING PROVIDER: Marrian Salvage, FNP   REFERRING DIAG: [phone] (ICD-10-CM) - Right leg pain   THERAPY DIAG:  Pain in right leg  Pain in left leg  Other abnormalities of gait and mobility  Cramp and spasm  Rationale for Evaluation and Treatment: Rehabilitation  ONSET DATE: 3-4 months ago  SUBJECTIVE:   SUBJECTIVE STATEMENT: Patient reports he feels a constant heaviness in his right leg now where before he was always  just fighting cramping by taking tiny steps.  PERTINENT HISTORY: CKD, DM, HTN, depression, neuropathy abdomen and thighs, lumbar fusion, CVA, severe obesity PAIN:  Are you having pain? No  PRECAUTIONS: None  WEIGHT BEARING RESTRICTIONS: No  FALLS:  Has patient fallen in last 6 months? 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 No DVT, thrombosis.  PATIENT SURVEYS:  LEFS 17 / 80 = 21.3 %  COGNITION: Overall cognitive status: Within functional limits for tasks assessed     SENSATION: Light touch: Impaired  and bil outer thighs  EDEMA: Reports significant edema; hasn't been determined why.   MUSCLE LENGTH: HQ:IONGEX less than 45 deg bil Quads:mod tightness bil ITB: NT Piriformis:mod tightness Hip Flexors:NT Heelcords: WNL   POSTURE: No Significant postural limitations  PALPATION: Palpation: TTP at post R knee and HS. Increased tissue tension in right HS  LUMBAR ROM:   Active  A/PROM  eval  Flexion Limited by tight HS (  hands to knees)  Extension WNL some pain in low back  Right lateral flexion full  Left lateral flexion full  Right rotation   Left rotation    (Blank rows = not tested)    LOWER EXTREMITY MMT:   ROM Right eval Left eval Right 01/24/22 Left 01/24/22  Hip flexion 4+ 4-    Hip extension 4+ 4+ 5 4+  Hip abduction 5 4+    Hip adduction 5 5    Hip internal rotation      Hip external rotation      Knee flexion 4+ (mild pain behind knee) 5    Knee extension 5 5    Ankle dorsiflexion 5 5     (Blank rows = not tested)  LOWER EXTREMITY ROM: WFL (limited by decreased flexibility and nerve tension)  GAIT: Distance  walked: 40 Assistive device utilized: None Level of assistance: Modified independence Comments: Short step and stride lengths, bil hip ER, wide stance    TODAY'S TREATMENT:                                                                                                                              DATE:     02/16/22 Therapeutic Exercise: to improve strength and mobility.  Demo, verbal and tactile cues throughout for technique.  Nustep L5x64mn Knee extension 10# 2x5 bil Knee flexion 10# x 10 bil Standing lat pulls 20# x 15 Seated deadlift x 10  Manual Therapy: to decrease muscle spasm and pain and improve mobility  STM to hip adductors and medial HS Long leg distraction both LE with holds  02/14/22 Therapeutic Exercise: to improve strength and mobility.  Demo, verbal and tactile cues throughout for technique.  Nustep L5x671m  no break today  Standing lumbar ext at wall x 5  (increases heaviness in his legs) Attempted Self traction in hooklying with foam roller on lap to press into - no change noted.  Self traction at sink - increased pain with forward flexion  The following TE was done after Manual and Self Care below:  Seated TrAb contraction 5 sec hold x 10 Seated pelvic rocking flex/ext x 10 on dyna disc Seated hip flexion with TrAb x 5 ea Seated ABD with TrAb with GTB x 5 Then did hookliying TrAB, hip flexion and clam with no band (these issued for HEP)  Manual Traction in hooklying with belt just distal to knee joints 3 x 30 sec - decreases R LE heaviness, but eventually brings on upper thigh pain.   Self Care: We discussed his previous L3/4, L4/5 fusions (20 years ago) and that many times the joints above and below become affected. He reports that the MD at the time told him L2 was not in good shape either but didn't need surgery at the time. Education to pt regarding the spine, nerve distributions, discs and associated anatomy for better understanding of why his thighs were  hurting and the feelings  of heaviness.      02/07/22 Pt seen for aquatic therapy today.  Treatment took place in water 3.25-4.5 ft in depth at the [place]. Temp of water was 91.  Pt entered/exited the pool via stairs with supervision and with bilat rail.    * holding wall:  side stepping R/L; heel/toe raises x 10; hip abdct/ addct x 5 x 2; hamstring curls x 10;  hip circles x 10 each; leg swings forward/ backward * holding wall with one hand and yellow hand float with other hand: forward walking  * progressed to walking forward with hands on white barbell at ~4 ft depth 2 laps * seated on bench in water:  alternating LAQ with DF; ankle circles  --- pt exited pool to check blood sugar -- 111 mg/dL --- pt re-entered pool * walking with hands on white barbell at 4 ft, working on even step length and varying speed   Pt requires the buoyancy and hydrostatic pressure of water for support, and to offload joints by unweighting joint load by at least 50 % in navel deep water and by at least 75-80% in chest to neck deep water.  Viscosity of the water is needed for resistance of strengthening. Water current perturbations provides challenge to standing balance requiring increased core activation.   02/03/22 Therapeutic Exercise: to improve strength and mobility.  Demo, verbal and tactile cues throughout for technique.  Nustep L4x65mn - frequent breaks d/t SOB  Knee flexion 10lb x 10 BLE Knee extension 5lb 2x5 BLE Forward step and back x 10 BLE - 1 hand support (rest required after) Supine Leg lengthener x 10 5 sec hold Supine LTR x 10 both ways Supine heel slides x 10 BLE - c/o wanting to cramp in R hip adductors  PATIENT EDUCATION:  Education details: HEP update  Person educated: Patient Education method: Explanation, Demonstration, and Handouts Education comprehension: verbalized understanding and returned demonstration  HOME EXERCISE PROGRAM: Access Code: [email or web address] URL:  [email or web address] Date: 02/14/2022 Prepared by: JAlmyra Free Exercises - Standing Hip Flexion with Counter Support  - 2 x daily - 7 x weekly - 1 sets - 10 reps - Seated Hamstring Stretch  - 2 x daily - 7 x weekly - 1 sets - 3 reps - 30-60 sec hold - Seated March  - 1 x daily - 7 x weekly - 2 sets - 5 reps - Seated Hip Abduction with Resistance  - 1 x daily - 7 x weekly - 2 sets - 5 reps - Supine Heel Slide  - 1 x daily - 7 x weekly - 2 sets - 10 reps - Supine Lower Trunk Rotation  - 1 x daily - 7 x weekly - 2 sets - 5 reps - Seated Long Arc Quad  - 1 x daily - 7 x weekly - 3 sets - 10 reps - 5 sec hold - Seated Table Piriformis Stretch  - 2 x daily - 7 x weekly - 1 sets - 3 reps - 30-60 sec hold - Hooklying Transversus Abdominis Palpation  - 2 x daily - 7 x weekly - 1-2 sets - 10 reps -  5 sec hold - Supine March  - 1 x daily - 7 x weekly - 2 sets - 10 reps - 3 sec hold - Supine Transversus Abdominis Bracing with Double Leg Fallout  - 1 x daily - 7 x weekly - 2 sets - 10 reps - 3 sec hold  ASSESSMENT:  CLINICAL IMPRESSION:  Manu reported no pain coming in today. He does report improvement in spasmin hamstrings, so he is progressing with LTG #6.  He noted little relief with long leg distraction. He shows better endurance with Nustep warm up not requiring any breaks. He continued to note sharp pains in L2 and L3 nerve distributions with palpation. Overall he responded well and continued to advise to reach out for referral to spine specialist to possibly have MRI.   OBJECTIVE IMPAIRMENTS: decreased activity tolerance, decreased endurance, difficulty walking, decreased ROM, decreased strength, increased edema, increased muscle spasms, impaired flexibility, impaired sensation, obesity, and pain.   ACTIVITY LIMITATIONS: standing, stairs, and locomotion level  PARTICIPATION LIMITATIONS:  affects all ADLs  PERSONAL FACTORS: Fitness and 3+ comorbidities: CKD, DM, HTN, depression,  neuropathy abdomen and thighs, lumbar fusion, CVA, severe obesity  are also affecting patient's functional outcome.   REHAB POTENTIAL: Good  CLINICAL DECISION MAKING: Evolving/moderate complexity  EVALUATION COMPLEXITY: Moderate   GOALS: Goals reviewed with patient? Yes  SHORT TERM GOALS: Target date: 01/24/2022  Remove [facility])  Patient will be independent with initial HEP. Baseline:  Goal status: MET    LONG TERM GOALS: Target date: 02/22/2022 (Remove [employer])  Patient will be independent with advanced/ongoing HEP to improve outcomes and carryover.  Baseline:  Goal status: IN PROGRESS  2.  Patient will report at least 75% improvement in bil leg pain to improve QOL and allow for improved gait mechanics. Baseline:  Goal status: IN PROGRESS  3.  Patient will demonstrate negative nerve tension tests in BLE. Baseline:  Goal status: IN PROGRESS  4.  Patient will demonstrate improved LE strength to 5/5 to ease transfers. Baseline:  Goal status: IN PROGRESS  5.  Patient will report improved tolerance to standing and walking by 25-50% to improve access to community.  Baseline:  Goal status: IN PROGRESS  6. Patient will report no spasms in R hamstrings with activity indicating improved flexibility. Baseline:  Goal status: IN PROGRESS - 02/16/22 - pt notes improvement in spasms  7.  Patient will report >= 26/80 on LEFS  to demonstrate improved functional ability. Baseline: 17 / 80 = 21.3 % Goal status: IN PROGRESS    PLAN:  PT FREQUENCY: 2x/week  PT DURATION: 6 weeks  PLANNED INTERVENTIONS: Therapeutic exercises, Therapeutic activity, Neuromuscular re-education, Balance training, Gait training, Patient/Family education, Self Care, Joint mobilization, Aquatic Therapy, Dry Needling, Electrical stimulation, Spinal mobilization, Cryotherapy, Moist heat, Taping, Ultrasound, Ionotophoresis 4m/ml Dexamethasone, and Manual therapy  PLAN FOR NEXT SESSION:  Focus on core strength  BArtist Pais PTA 02/16/2022 4:23 PM

## 2022-02-21 ENCOUNTER — Encounter: Payer: Self-pay | Admitting: Physical Therapy

## 2022-02-21 ENCOUNTER — Ambulatory Visit: Payer: Medicare PPO | Admitting: Physical Therapy

## 2022-02-21 DIAGNOSIS — M79604 Pain in right leg: Secondary | ICD-10-CM | POA: Diagnosis not present

## 2022-02-21 DIAGNOSIS — R252 Cramp and spasm: Secondary | ICD-10-CM

## 2022-02-21 DIAGNOSIS — R2689 Other abnormalities of gait and mobility: Secondary | ICD-10-CM

## 2022-02-21 DIAGNOSIS — M79605 Pain in left leg: Secondary | ICD-10-CM

## 2022-02-21 NOTE — Therapy (Signed)
OUTPATIENT PHYSICAL THERAPY TREATMENT / DISCHARGE SUMMARY   Patient Name: Paul Adkins MRN: [id number] DOB:Sep 30, 1945, 76 y.o., male Today's Date: 02/21/2022   PT End of Session - 02/21/22 0843     Visit Number 10    Number of Visits 12    Date for PT Re-Evaluation 02/22/22    Authorization Type [employer] MCR    Authorization Time Period 01/11/22-02/22/22    Authorization - Visit Number 10    Authorization - Number of Visits 12    Progress Note Due on Visit --    PT Start Time 4098    PT Stop Time 0926    PT Time Calculation (min) 42 min    Activity Tolerance Patient tolerated treatment well    Behavior During Therapy [facility] for tasks assessed/performed                Past Medical History:  Diagnosis Date   Carpal tunnel syndrome    Chronic kidney disease    Depression    Diabetes mellitus without complication ([place])    ED (erectile dysfunction)    Edema    Hyperlipidemia    Hypertension    Lumbar disc disease    Neuropathy    Sleep apnea    Stroke [facility])    Past Surgical History:  Procedure Laterality Date   BACK SURGERY     VASECTOMY     Patient Active Problem List   Diagnosis Date Noted   Conjunctivitis 09/19/2018   Obstructive sleep apnea 08/26/2013   Type 1 diabetes mellitus with complications ([place]) 11/91/4782   Essential hypertension, benign 09/04/2012   Hypogonadism male 09/04/2012   Depression 09/04/2012   Pure hypercholesterolemia 09/04/2012   Severe obesity (BMI >= 40) ([place]) 09/04/2012    PCP: Marrian Salvage, [place]   REFERRING PROVIDER: Marrian Salvage, FNP   REFERRING DIAG: [phone] (ICD-10-CM) - Right leg pain   THERAPY DIAG:  Pain in right leg  Pain in left leg  Other abnormalities of gait and mobility  Cramp and spasm  Rationale for Evaluation and Treatment: Rehabilitation  ONSET DATE: 3-4 months ago  SUBJECTIVE:   SUBJECTIVE STATEMENT: Patient reports some benefit from PT when he comes and does his HEP on a  regular basis but notes he reverts to small shuffle steps when he is not exercising regularly. Pain is not so much of an issue but has ongoing heaviness and fatigue in his legs.  PAIN:  Are you having pain? No  PERTINENT HISTORY: CKD, DM, HTN, depression, neuropathy abdomen and thighs, lumbar fusion, CVA, severe obesity  PRECAUTIONS: None  WEIGHT BEARING RESTRICTIONS: No  FALLS:  Has patient fallen in last 6 months? No  LIVING ENVIRONMENT: Lives with: lives alone Lives in: House/apartment Stairs: No Has following equipment at home: Environmental consultant - 2 wheeled  OCCUPATION: retired  PLOF: Independent  PATIENT GOALS: be able to move without pain  NEXT MD VISIT: another year   OBJECTIVE:   DIAGNOSTIC FINDINGS: vein and vascular study a few weeks ago shows reflux in veinous return. No DVT, thrombosis.  PATIENT SURVEYS:  LEFS 17 / 80 = 21.3 %  COGNITION: Overall cognitive status: Within functional limits for tasks assessed     SENSATION: Light touch: Impaired  and bil outer thighs  EDEMA:  Reports significant edema; hasn't been determined why.  MUSCLE LENGTH: YQ:MVHQIO less than 45 deg bil Quads:mod tightness bil ITB: NT Piriformis:mod tightness Hip Flexors:NT Heelcords: WNL  POSTURE: No Significant postural limitations  PALPATION: Palpation: TTP  at post R knee and HS. Increased tissue tension in right HS  LUMBAR ROM:   Active  A/PROM  eval  Flexion Limited by tight HS (hands to knees)  Extension WNL some pain in low back  Right lateral flexion full  Left lateral flexion full  Right rotation   Left rotation    (Blank rows = not tested)  LOWER EXTREMITY MMT:   ROM Right eval Left eval Right 01/24/22 Left 01/24/22 Right 02/21/22 Left 02/21/22  Hip flexion 4+ 4-   5 5  Hip extension 4+ 4+ 5 4+ 5 5  Hip abduction 5 4+   5 5  Hip adduction _0 Hip internal rotation     5 5  Hip external rotation     4+ (Mild pain in lateral hip and thigh) 5  Knee  flexion 4+ (mild pain behind knee) 5   5 (Mild posterior knee and thigh pain) 5  Knee extension _1 Ankle dorsiflexion _2 (Blank rows = not tested)  LOWER EXTREMITY ROM:  WFL (limited by decreased flexibility and nerve tension)  GAIT: Distance walked: 40 Assistive device utilized: None Level of assistance: Modified independence Comments: Short step and stride lengths, bil hip ER, wide stance    TODAY'S TREATMENT:                                                                                                                              DATE:     02/21/22 THERAPEUTIC EXERCISE: to improve flexibility, strength and mobility.  Verbal and tactile cues throughout for technique. NuStep - L5 x 6 min  THERAPEUTIC ACTIVITIES: Goal assessment LEFS = 10 / 80 = 12.5 % MMT Lumbar Special Tests:  Slump test: Positive - bilaterally  SELF CARE:  Provided education in proper posture and body mechanics for typical daily positioning and household chores such as washing dishes to minimize strain on low back.   02/16/22 Therapeutic Exercise: to improve strength and mobility.  Demo, verbal and tactile cues throughout for technique.  Nustep L5x60mn Knee extension 10# 2x5 bil Knee flexion 10# x 10 bil Standing lat pulls 20# x 15 Seated deadlift x 10  Manual Therapy: to decrease muscle spasm and pain and improve mobility  STM to hip adductors and medial HS Long leg distraction both LE with holds   02/14/22 Therapeutic Exercise: to improve strength and mobility.  Demo, verbal and tactile cues throughout for technique.  Nustep L5x658m  no break today  Standing lumbar ext at wall x 5  (increases heaviness in his legs) Attempted Self traction in hooklying with foam roller on lap to press into - no change noted.  Self traction at sink - increased pain with forward flexion  The following TE was done after Manual and Self Care below:  Seated TrAb contraction 5 sec hold x  10 Seated  pelvic rocking flex/ext x 10 on dyna disc Seated hip flexion with TrAb x 5 ea Seated ABD with TrAb with GTB x 5 Then did hookliying TrAB, hip flexion and clam with no band (these issued for HEP)  Manual Traction in hooklying with belt just distal to knee joints 3 x 30 sec - decreases R LE heaviness, but eventually brings on upper thigh pain.   Self Care: We discussed his previous L3/4, L4/5 fusions (20 years ago) and that many times the joints above and below become affected. He reports that the MD at the time told him L2 was not in good shape either but didn't need surgery at the time. Education to pt regarding the spine, nerve distributions, discs and associated anatomy for better understanding of why his thighs were hurting and the feelings of heaviness.     PATIENT EDUCATION:  Education details: HEP update Person educated: Patient Education method: Explanation, Demonstration, and Handouts Education comprehension: verbalized understanding and returned demonstration  HOME EXERCISE PROGRAM: Access Code: [email or web address] URL: [email or web address] Date: 02/14/2022 Prepared by: Almyra Free  Exercises - Standing Hip Flexion with Counter Support  - 2 x daily - 7 x weekly - 1 sets - 10 reps - Seated Hamstring Stretch  - 2 x daily - 7 x weekly - 1 sets - 3 reps - 30-60 sec hold - Seated March  - 1 x daily - 7 x weekly - 2 sets - 5 reps - Seated Hip Abduction with Resistance  - 1 x daily - 7 x weekly - 2 sets - 5 reps - Supine Heel Slide  - 1 x daily - 7 x weekly - 2 sets - 10 reps - Supine Lower Trunk Rotation  - 1 x daily - 7 x weekly - 2 sets - 5 reps - Seated Long Arc Quad  - 1 x daily - 7 x weekly - 3 sets - 10 reps - 5 sec hold - Seated Table Piriformis Stretch  - 2 x daily - 7 x weekly - 1 sets - 3 reps - 30-60 sec hold - Hooklying Transversus Abdominis Palpation  - 2 x daily - 7 x weekly - 1-2 sets - 10 reps -  5 sec hold - Supine March  - 1 x daily - 7 x weekly - 2 sets  - 10 reps - 3 sec hold - Supine Transversus Abdominis Bracing with Double Leg Fallout  - 1 x daily - 7 x weekly - 2 sets - 10 reps - 3 sec hold  ASSESSMENT:  CLINICAL IMPRESSION: Dominyk reports minimal change in back and lower extremity pain minimal to no change in activity tolerance, other than potentially slight improvement in walking tolerance if he remembers to take larger steps.  He continues to demonstrate positive neural tension test with positive slump test bilaterally.  Lower extremity strength has improved to grossly 5/5 with only exception being right hip external rotation at 4+/5, however pain with most resisted MMT in R>L lower extremity.  LEFS demonstrating a decline by 7 points.  Overall some progress observed towards goals but several goals remaining only partially met or not met at this time.  Discussed progress with physical therapy and recommendations going forward, with recommendation for patient to follow-up with referring provider to see if additional testing and/or spine specialist referral indicated.  OBJECTIVE IMPAIRMENTS: decreased activity tolerance, decreased endurance, difficulty walking, decreased ROM, decreased strength, increased edema, increased muscle spasms, impaired flexibility, impaired sensation, obesity, and pain.   ACTIVITY LIMITATIONS: standing,  stairs, and locomotion level  PARTICIPATION LIMITATIONS:  affects all ADLs  PERSONAL FACTORS: Fitness and 3+ comorbidities: CKD, DM, HTN, depression, neuropathy abdomen and thighs, lumbar fusion, CVA, severe obesity  are also affecting patient's functional outcome.   REHAB POTENTIAL: Good  CLINICAL DECISION MAKING: Evolving/moderate complexity  EVALUATION COMPLEXITY: Moderate   GOALS: Goals reviewed with patient? Yes  SHORT TERM GOALS: Target date: 01/24/2022   Patient will be independent with initial HEP. Baseline:  Goal status: MET  LONG TERM GOALS: Target date: 02/22/2022   Patient will be  independent with advanced/ongoing HEP to improve outcomes and carryover.   Baseline:  Goal status: MET  02/21/22 - Pt admits to doing the HEP "less and less" due to ADHD and managing his DM but denies any issues or concerns with the HEP  2.  Patient will report at least 75% improvement in bil leg pain to improve QOL and allow for improved gait mechanics. Baseline:  Goal status: PARTIALLY MET  02/21/22 - Pt reports 20% improvement, "give or take 30%"  3.  Patient will demonstrate negative nerve tension tests in BLE. Baseline:  Goal status: NOT MET  02/21/22 - Positive Slump test bilaterally  4.  Patient will demonstrate improved LE strength to 5/5 to ease transfers. Baseline:  Goal status: PARTIALLY MET 02/21/22 - met except R hip ER 4+/5; pain with pressure against LE for most MMT  5.  Patient will report improved tolerance to standing and walking by 25-50% to improve access to community.  Baseline:  Goal status: NOT MET  02/21/22 - essentially unchanged - can only tolerate 1 minute standing, but walking "maybe a little bit better if not taking small steps"  6. Patient will report no spasms in R hamstrings with activity indicating improved flexibility. Baseline:  Goal status: PARTIALLY MET  02/16/22 - pt notes improvement in spasms  7.  Patient will report >/= 26/80 on LEFS  to demonstrate improved functional ability. Baseline: 17 / 80 = 21.3 % Goal status: NOT MET  02/21/22 - LEFS = 10 / 80 = 12.5%    PLAN:  PT FREQUENCY: 2x/week  PT DURATION: 6 weeks  PLANNED INTERVENTIONS: Therapeutic exercises, Therapeutic activity, Neuromuscular re-education, Balance training, Gait training, Patient/Family education, Self Care, Joint mobilization, Aquatic Therapy, Dry Needling, Electrical stimulation, Spinal mobilization, Cryotherapy, Moist heat, Taping, Ultrasound, Ionotophoresis 70m/ml Dexamethasone, and Manual therapy  PLAN FOR NEXT SESSION: Discharge from PT with recommendation to f/u  with referring provider to determine if additional testing or spine specialist referral necessary     PHYSICAL THERAPY DISCHARGE SUMMARY  Visits from Start of Care: 10  Current functional level related to goals / functional outcomes:   Refer to above clinical impression and goal assessment.   Remaining deficits:   Ongoing heaviness and fatigue in bilateral lower extremities with positive neural tension symptoms, resulting in limited activity tolerance especially for standing and walking.   Education / Equipment:   HEP   Patient agrees to discharge. Patient goals were partially met. Patient is being discharged due to  need for follow-up with MD to determine ongoing plan of care.  JPercival Spanish PT 02/21/2022 12:21 PM

## 2022-03-06 ENCOUNTER — Encounter (INDEPENDENT_AMBULATORY_CARE_PROVIDER_SITE_OTHER): Payer: Medicare PPO | Admitting: Ophthalmology

## 2022-03-06 DIAGNOSIS — H35033 Hypertensive retinopathy, bilateral: Secondary | ICD-10-CM

## 2022-03-06 DIAGNOSIS — E113592 Type 2 diabetes mellitus with proliferative diabetic retinopathy without macular edema, left eye: Secondary | ICD-10-CM | POA: Diagnosis not present

## 2022-03-06 DIAGNOSIS — E113391 Type 2 diabetes mellitus with moderate nonproliferative diabetic retinopathy without macular edema, right eye: Secondary | ICD-10-CM

## 2022-03-06 DIAGNOSIS — H43813 Vitreous degeneration, bilateral: Secondary | ICD-10-CM

## 2022-03-06 DIAGNOSIS — I10 Essential (primary) hypertension: Secondary | ICD-10-CM | POA: Diagnosis not present

## 2022-03-06 DIAGNOSIS — D3132 Benign neoplasm of left choroid: Secondary | ICD-10-CM

## 2022-03-23 ENCOUNTER — Telehealth: Payer: Self-pay

## 2022-03-23 NOTE — Telephone Encounter (Signed)
[facility] called requesting chart notes be faxed. Most recent sent over.

## 2022-04-07 ENCOUNTER — Other Ambulatory Visit (INDEPENDENT_AMBULATORY_CARE_PROVIDER_SITE_OTHER): Payer: Medicare PPO

## 2022-04-07 ENCOUNTER — Other Ambulatory Visit: Payer: Medicare PPO

## 2022-04-07 ENCOUNTER — Other Ambulatory Visit: Payer: Self-pay | Admitting: Endocrinology

## 2022-04-07 DIAGNOSIS — E291 Testicular hypofunction: Secondary | ICD-10-CM

## 2022-04-07 DIAGNOSIS — E1065 Type 1 diabetes mellitus with hyperglycemia: Secondary | ICD-10-CM | POA: Diagnosis not present

## 2022-04-07 LAB — BASIC METABOLIC PANEL
BUN: 35 mg/dL — ABNORMAL HIGH (ref 6–23)
CO2: 28 mEq/L (ref 19–32)
Calcium: 8.8 mg/dL (ref 8.4–10.5)
Chloride: 104 mEq/L (ref 96–112)
Creatinine, Ser: 1.89 mg/dL — ABNORMAL HIGH (ref 0.40–1.50)
GFR: 34.58 mL/min — ABNORMAL LOW (ref >60.00)
Glucose, Bld: 115 mg/dL — ABNORMAL HIGH (ref 70–99)
Potassium: 4.9 mEq/L (ref 3.5–5.1)
Sodium: 140 mEq/L (ref 135–145)

## 2022-04-07 LAB — HEMOGLOBIN A1C: Hgb A1c MFr Bld: 7.7 % — ABNORMAL HIGH (ref 4.6–6.5)

## 2022-04-07 LAB — TESTOSTERONE: Testosterone: 551.74 ng/dL (ref 300.00–890.00)

## 2022-04-11 ENCOUNTER — Encounter: Payer: Self-pay | Admitting: Endocrinology

## 2022-04-11 ENCOUNTER — Ambulatory Visit: Payer: Medicare PPO | Admitting: Endocrinology

## 2022-04-11 VITALS — BP 128/62 | HR 67 | Ht 67.0 in | Wt 308.0 lb

## 2022-04-11 DIAGNOSIS — E291 Testicular hypofunction: Secondary | ICD-10-CM | POA: Diagnosis not present

## 2022-04-11 DIAGNOSIS — N1831 Chronic kidney disease, stage 3a: Secondary | ICD-10-CM

## 2022-04-11 DIAGNOSIS — E1065 Type 1 diabetes mellitus with hyperglycemia: Secondary | ICD-10-CM

## 2022-04-11 NOTE — Progress Notes (Signed)
Patient ID: Paul Adkins, male   DOB: 06-18-1945, 77 y.o.   MRN: [id number]   Reason for visit:   follow-up  Diagnosis: Type 1 diabetes, date of onset 1978  PAST history: He has had persistently poorly controlled diabetes for several years. A1c has been high with usual range 8.2 -9, overall improved since 2013. Compliance with glucose monitoring and diet has been variable and he does better when he is checking more sugars. His A1c was better than usual in 3/14 at 7.5 but he was also having significant hypoglycemia overnight and also occasionally later in the day. At that time he was started on Invokana 300 mg daily which helped him with glucose control and mild weight loss  CURRENT insulin pump brand: T-slim  PUMP SETTINGS are:   Basal rates: 2.8 from midnight- 4 AM. 4 AM = 2.75.  8 AM = 2.9.  8 PM = 3.2 Boluses : 1 unit for 4 g carbs at mealtimes  Glucose target 110-120, sensitivity 1: 20, with active insulin 5 hours.  Changing tubing every 1.9 days Recent insulin requirement average 149 units/day   RECENT history:   His A1c is 7.7 and higher  Average from recent CGM 153  Current glycemic patterns, management and problems:  Glycemic patterns were reviewed from his T-Slim pump download  Interpretation of the Dexcom data from the pump download for the last 2 weeks as follows: Automatic blood sugars are within the target range at all times with highest average blood sugars in the evening segment Blood sugars show significant variability at all times especially late evening  Overnight blood sugars generally are higher overall in the early part of the night and occasionally low normal, variability present at all times  On an average blood sugars are the lowest before dinnertime However no hypoglycemia POSTPRANDIAL readings are quite variable with periodic significant increases after all meals; some of the rise in blood sugar may have been in the bimodal fashion after the  meal Occasionally high blood sugars after dinner may persist into the night   Insulin pump management: He has variable blood sugar patterns as discussed above Factors causing higher readings appear to be either inadequate or late boluses at mealtimes and occasionally may have missed boluses  Generally overnight blood sugars tend to be relatively high with only a couple of days with good readings He does have some more snacks in the evenings but generally appears to be taking boluses for this Overall trying to eat relatively low-fat meals Hypoglycemia not present and only occasional low normal readings Recently not using sleep mode at night Total daily insulin about 122 units for the last 2 weeks He is concerned about his lack of weight loss   CGM 2-week statistics:      CGM use % of time   2-week average/SD 153    Time in range 76    %  % Time Above 180 24  % Time above 250   % Time Below 70       PRE-MEAL  overnight  mornings  afternoon  evening Overall  Glucose range:       Averages: 157 150 144 160    Previously  CGM use % of time   2-week average/SD 151  Time in range      77  %  % Time Above 180 21  % Time above 250 2  % Time Below 70 0      PRE-MEAL  overnight  mornings  afternoon  evening Overall  Glucose range:       Averages: 154 142 148 160 151     Wt Readings from Last 3 Encounters:  04/11/22 (!) 308 lb (139.7 kg)  12/27/21 (!) 305 lb 4.8 oz (138.5 kg)  12/23/21 (!) 307 lb 6.4 oz (139.4 kg)    Lab Results  Component Value Date   HGBA1C 7.7 (H) 04/07/2022   HGBA1C 7.3 (H) 12/20/2021   HGBA1C 7.5 (H) 09/12/2021   Lab Results  Component Value Date   MICROALBUR <0.7 09/12/2021   LDLCALC 39 12/20/2021   CREATININE 1.89 (H) 04/07/2022    OTHER problems addressed today are in review of systems    Lab on 04/07/2022  Component Date Value Ref Range Status   Testosterone 04/07/2022 551.74  300.00 - 890.00 ng/dL Final   Sodium 04/07/2022 140   135 - 145 mEq/L Final   Potassium 04/07/2022 4.9  3.5 - 5.1 mEq/L Final   Chloride 04/07/2022 104  96 - 112 mEq/L Final   CO2 04/07/2022 28  19 - 32 mEq/L Final   Glucose, Bld 04/07/2022 115 (H)  70 - 99 mg/dL Final   BUN 04/07/2022 35 (H)  6 - 23 mg/dL Final   Creatinine, Ser 04/07/2022 1.89 (H)  0.40 - 1.50 mg/dL Final   GFR 04/07/2022 34.58 (L)  >60.00 mL/min Final   Calculated using the CKD-EPI Creatinine Equation (2021)   Calcium 04/07/2022 8.8  8.4 - 10.5 mg/dL Final   Hgb A1c MFr Bld 04/07/2022 7.7 (H)  4.6 - 6.5 % Final   Glycemic Control Guidelines for People with Diabetes:Non Diabetic:  <6%Goal of Therapy: <7%Additional Action Suggested:  >8%     Allergies as of 04/11/2022   No Known Allergies      Medication List        Accurate as of April 11, 2022  9:50 AM. If you have any questions, ask your nurse or doctor.          atorvastatin 20 MG tablet Commonly known as: LIPITOR TAKE 1 TABLET BY MOUTH EVERY DAY   Comirnaty Susp injection Generic drug: COVID-19 mRNA Vac-TriS (Pfizer) Inject into the muscle.   furosemide 40 MG tablet Commonly known as: LASIX Take 2 per day   glucose blood test strip Commonly known as: Visual merchandiser Next Test Use as instructed to check blood sugars 4 times per day dx code E10.65   insulin aspart 100 UNIT/ML injection Commonly known as: NovoLOG USE MAX OF 170 UNITS UNDER THE SKIN DAILY VIA INSULIN PUMP. E11.65   Jatenzo 237 MG Caps Generic drug: Testosterone Undecanoate 1 capsule with breakfast and 1 with dinner   methylphenidate 10 MG tablet Commonly known as: RITALIN Take 1 tablet up to twice a day   MIRALAX PO Take by mouth every other day.   olmesartan 20 MG tablet Commonly known as: Benicar Take 1 tablet (20 mg total) by mouth daily.   olmesartan 40 MG tablet Commonly known as: BENICAR Take 40 mg by mouth daily.   PARoxetine 40 MG tablet Commonly known as: PAXIL TAKE 1 TABLET BY MOUTH EVERYDAY AT BEDTIME    VITAMIN D (CHOLECALCIFEROL) PO Take 6,000 Int'l Units/day by mouth.        Allergies:  No Known Allergies   Past Medical History:  Diagnosis Date   Carpal tunnel syndrome    Chronic kidney disease    Depression    Diabetes mellitus without complication [facility])    ED (erectile  dysfunction)    Edema    Hyperlipidemia    Hypertension    Lumbar disc disease    Neuropathy    Sleep apnea    Stroke [facility])     Past Surgical History:  Procedure Laterality Date   BACK SURGERY     VASECTOMY      Family History  Problem Relation Age of Onset   Osteoarthritis Mother    Cancer Father        Prostate   Cancer Brother    Drug abuse Brother    Diabetes Neg Hx     Social History:  reports that he has never smoked. He has never been exposed to tobacco smoke. He has never used smokeless tobacco. He reports that he does not drink alcohol and does not use drugs.  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   Had been on AndroGel 1.62% pump  Currently taking Jatenzo 237 mg twice a day  Overall has no fatigue With increasing his dose his testosterone has improved Labs taken 1-2 hours after his morning dose  Testosterone level as follows:   Lab Results  Component Value Date   TESTOSTERONE 551.74 04/07/2022     HYPERTENSION: treated with 40 mg Benicar and carvedilol unknown dose  He reports his blood pressure has been 150s, checking twice a day His nephrologist is prescribing 40 mg on Benicar dose and  now taking Coreg also  He has been to the nephrologist regularly  Again taking Lasix 80 mg daily as prescribed by his nephrologist  Repeat blood pressure by myself large cuff was 150/80  BP Readings from Last 3 Encounters:  04/11/22 128/62  12/27/21 (!) 173/68  12/23/21 (!) 164/60     His creatinine is variable  No microalbuminuria   Lab Results  Component Value Date   CREATININE 1.89 (H) 04/07/2022   CREATININE 1.66 (H) 12/20/2021   CREATININE 1.90 (H) 09/12/2021     Hypercholesterolemia: Has been controlled on Lipitor 20 mg, has low HDL   Lab Results  Component Value Date   CHOL 98 12/20/2021   HDL 32.20 (L) 12/20/2021   LDLCALC 39 12/20/2021   LDLDIRECT 67.0 08/02/2015   TRIG 134.0 12/20/2021   CHOLHDL 3 12/20/2021    Has history of retinopathy, has regular follow-up with specialist Dr  Zigmund Daniel   EXAM:   BP 128/62 (BP Location: Right Arm, Patient Position: Sitting, Cuff Size: Normal)   Pulse 67   Ht 5' 7"$  (1.702 m)   Wt (!) 308 lb (139.7 kg)   SpO2 97%   BMI 48.24 kg/m     ASSESSMENT/PLAN:  DIABETES Type I and obesity:  See history of present illness for detailed discussion on current blood sugar patterns from data obtained from download of devices and problems identified  His A1c is 7.7  He has been using the T-slim pump using control IQ consistently  His A1c is relatively higher although blood sugars from his Dexcom appear to be similar to the last visit Postprandial  hyperglycemia related to either missed or delayed boluses and sometimes inadequate boluses Maybe not covering all snacks also Overnight blood sugars are generally higher than target  HYPOGONADISM: Testosterone level is back to normal  He will continue Jatenzo 238 mg daily   To have testosterone level checked again on the next visit   Mild CKD: Creatinine is fluctuating, continue follow-up with nephrologist  HYPERTENSION: Blood pressure is managed by nephrologist and he  checks daily also   There are no Patient Instructions on file for this visit.   Elayne Snare 04/11/2022

## 2022-05-05 ENCOUNTER — Encounter: Payer: Self-pay | Admitting: Family

## 2022-05-05 ENCOUNTER — Ambulatory Visit (HOSPITAL_BASED_OUTPATIENT_CLINIC_OR_DEPARTMENT_OTHER)
Admission: RE | Admit: 2022-05-05 | Discharge: 2022-05-05 | Disposition: A | Discharge status: Home / Self Care | Payer: Medicare PPO | Source: Ambulatory Visit | Attending: Family | Admitting: Family

## 2022-05-05 ENCOUNTER — Ambulatory Visit: Payer: Medicare PPO | Admitting: Family

## 2022-05-05 ENCOUNTER — Encounter: Payer: Self-pay | Admitting: Endocrinology

## 2022-05-05 ENCOUNTER — Other Ambulatory Visit: Payer: Self-pay | Admitting: Internal Medicine

## 2022-05-05 VITALS — BP 144/68 | HR 62 | Resp 19 | Ht 67.0 in | Wt 305.0 lb

## 2022-05-05 DIAGNOSIS — I10 Essential (primary) hypertension: Secondary | ICD-10-CM

## 2022-05-05 DIAGNOSIS — R0602 Shortness of breath: Secondary | ICD-10-CM | POA: Diagnosis present

## 2022-05-05 DIAGNOSIS — E291 Testicular hypofunction: Secondary | ICD-10-CM

## 2022-05-05 DIAGNOSIS — M545 Low back pain, unspecified: Secondary | ICD-10-CM

## 2022-05-05 DIAGNOSIS — G8929 Other chronic pain: Secondary | ICD-10-CM

## 2022-05-05 NOTE — Patient Instructions (Signed)
Please reach out to Dr. Dwyane Dee with your concerns about the testosterone supplement;  Please let your nephrologist know that the changes made recently are not helping your blood pressure.  We will get you referred to cardiology for further evaluation.   We will also put in referral to orthopedics about your chronic back issues.

## 2022-05-05 NOTE — Progress Notes (Signed)
Paul Adkins is a 77 y.o. male with the following history as recorded in EpicCare:  Patient Active Problem List   Diagnosis Date Noted   Conjunctivitis 09/19/2018   Obstructive sleep apnea 08/26/2013   Type 1 diabetes mellitus with complications ([place]) 99991111   Essential hypertension, benign 09/04/2012   Hypogonadism male 09/04/2012   Depression 09/04/2012   Pure hypercholesterolemia 09/04/2012   Severe obesity (BMI >= 40) ([facility]) 09/04/2012    Current Outpatient Medications  Medication Sig Dispense Refill   atorvastatin (LIPITOR) 20 MG tablet TAKE 1 TABLET BY MOUTH EVERY DAY 90 tablet 1   carvedilol (COREG) 6.25 MG tablet Take 6.25 mg by mouth 2 (two) times daily with a meal.     COMIRNATY SUSP injection Inject into the muscle.     furosemide (LASIX) 40 MG tablet Take 2 per day 60 tablet    glucose blood (BAYER CONTOUR NEXT TEST) test strip Use as instructed to check blood sugars 4 times per day dx code E10.65 150 each 5   insulin aspart (NOVOLOG) 100 UNIT/ML injection USE MAX OF 170 UNITS UNDER THE SKIN DAILY VIA INSULIN PUMP. E11.65 60 mL PRN   methylphenidate (RITALIN) 10 MG tablet Take 1 tablet up to twice a day 60 tablet 0   olmesartan (BENICAR) 40 MG tablet Take 40 mg by mouth daily.     PARoxetine (PAXIL) 40 MG tablet TAKE 1 TABLET BY MOUTH EVERYDAY AT BEDTIME 90 tablet 2   Polyethylene Glycol 3350 (MIRALAX PO) Take by mouth every other day.     Testosterone Undecanoate (JATENZO) 237 MG CAPS 1 capsule with breakfast and 1 with dinner 60 capsule 3   VITAMIN D, CHOLECALCIFEROL, PO Take 6,000 Int'l Units/day by mouth.     No current facility-administered medications for this visit.    Allergies: Patient has no known allergies.  Past Medical History:  Diagnosis Date   Carpal tunnel syndrome    Chronic kidney disease    Depression    Diabetes mellitus without complication ([facility])    ED (erectile dysfunction)    Edema    Hyperlipidemia    Hypertension    Lumbar disc  disease    Neuropathy    Sleep apnea    Stroke [facility])     Past Surgical History:  Procedure Laterality Date   BACK SURGERY     VASECTOMY      Family History  Problem Relation Age of Onset   Osteoarthritis Mother    Cancer Father        Prostate   Cancer Brother    Drug abuse Brother    Diabetes Neg Hx     Social History   Tobacco Use   Smoking status: Never    Passive exposure: Never   Smokeless tobacco: Never  Substance Use Topics   Alcohol use: No    Subjective:   Presents with concerns for symptoms of fatigue/ shortness of breath with exertion "for months." Had echo in 2022; Patient is concerned that the symptoms could be related to the testosterone medication prescribed by his endocrinologist is causing the symptoms; Has not been able to speak to his endocrinologist about these concerns- did stop the medication;  Nephrology is managing his blood pressure- added new medication recently but does not feel responding; has not reached out to nephrologist that not responding well to medication;     Objective:  Vitals:   05/05/22 1012 05/05/22 1100  BP: (!) 160/68 (!) 144/68  Pulse: 62   Resp:  19   SpO2: 98%   Weight: (!) 305 lb (138.3 kg)   Height: '5\' 7"'$  (1.702 m)     General: Well developed, well nourished, in no acute distress  Skin : Warm and dry.  Head: Normocephalic and atraumatic  Eyes: Sclera and conjunctiva clear; pupils round and reactive to light; extraocular movements intact  Ears: External normal; canals clear; tympanic membranes normal  Oropharynx: Pink, supple. No suspicious lesions  Neck: Supple without thyromegaly, adenopathy  Lungs: Respirations unlabored; clear to auscultation bilaterally without wheeze, rales, rhonchi  CVS exam: normal rate and regular rhythm.  Neurologic: Alert and oriented; speech intact; face symmetrical; moves all extremities well; CNII-XII intact without focal deficit   Assessment:  1. Shortness of breath   2. Chronic  low back pain, unspecified back pain laterality, unspecified whether sciatica present   3. Essential hypertension, benign   4. Hypogonadism male     Plan:  EKG shows sinus rhythm w but due to presenting symptoms and risk factors, will refer to cardiology for further evaluation; will also update CXR today;  Refer to orthopedist; Stressed to patient he needs to let his nephrologist know that recent medication changes are not effective as they had hoped; Needs to discuss concerns for side effects of testosterone medication with his endocrinologist;   No follow-ups on file.  Orders Placed This Encounter  Procedures   DG Chest 2 View    Order Specific Question:   Reason for Exam (SYMPTOM  OR DIAGNOSIS REQUIRED)    Answer:   shortness of breath    Order Specific Question:   Preferred imaging location?    Answer:   [place]   Ambulatory referral to Cardiology    Referral Priority:   Routine    Referral Type:   Consultation    Referral Reason:   Specialty Services Required    Number of Visits Requested:   1   Ambulatory referral to Orthopedics    Referral Priority:   Routine    Referral Type:   Consultation    Number of Visits Requested:   1   EKG 12-Lead    Requested Prescriptions    No prescriptions requested or ordered in this encounter

## 2022-05-16 ENCOUNTER — Other Ambulatory Visit: Payer: Self-pay | Admitting: Endocrinology

## 2022-06-22 ENCOUNTER — Other Ambulatory Visit: Payer: Self-pay | Admitting: Endocrinology

## 2022-07-10 ENCOUNTER — Other Ambulatory Visit (INDEPENDENT_AMBULATORY_CARE_PROVIDER_SITE_OTHER): Payer: Medicare PPO

## 2022-07-10 ENCOUNTER — Other Ambulatory Visit: Payer: Medicare PPO

## 2022-07-10 DIAGNOSIS — E291 Testicular hypofunction: Secondary | ICD-10-CM

## 2022-07-10 DIAGNOSIS — E1065 Type 1 diabetes mellitus with hyperglycemia: Secondary | ICD-10-CM | POA: Diagnosis not present

## 2022-07-10 DIAGNOSIS — N1831 Chronic kidney disease, stage 3a: Secondary | ICD-10-CM | POA: Diagnosis not present

## 2022-07-10 LAB — CBC
HCT: 44.6 % (ref 39.0–52.0)
Hemoglobin: 15 g/dL (ref 13.0–17.0)
MCHC: 33.6 g/dL (ref 30.0–36.0)
MCV: 88.5 fl (ref 78.0–100.0)
Platelets: 236 10*3/uL (ref 150.0–400.0)
RBC: 5.04 Mil/uL (ref 4.22–5.81)
RDW: 14.4 % (ref 11.5–15.5)
WBC: 8.2 10*3/uL (ref 4.0–10.5)

## 2022-07-10 LAB — GLUCOSE, RANDOM: Glucose, Bld: 85 mg/dL (ref 70–99)

## 2022-07-10 LAB — TESTOSTERONE: Testosterone: 368.34 ng/dL (ref 300.00–890.00)

## 2022-07-10 LAB — HEMOGLOBIN A1C: Hgb A1c MFr Bld: 7.5 % — ABNORMAL HIGH (ref 4.6–6.5)

## 2022-07-12 ENCOUNTER — Ambulatory Visit: Payer: Medicare PPO | Admitting: Endocrinology

## 2022-07-12 ENCOUNTER — Other Ambulatory Visit: Payer: Self-pay

## 2022-07-12 ENCOUNTER — Encounter: Payer: Self-pay | Admitting: Endocrinology

## 2022-07-12 VITALS — BP 150/70 | HR 78 | Resp 20 | Ht 67.0 in | Wt 305.8 lb

## 2022-07-12 DIAGNOSIS — I10 Essential (primary) hypertension: Secondary | ICD-10-CM

## 2022-07-12 DIAGNOSIS — E559 Vitamin D deficiency, unspecified: Secondary | ICD-10-CM | POA: Diagnosis not present

## 2022-07-12 DIAGNOSIS — R400 Somnolence: Secondary | ICD-10-CM | POA: Diagnosis not present

## 2022-07-12 DIAGNOSIS — E291 Testicular hypofunction: Secondary | ICD-10-CM

## 2022-07-12 DIAGNOSIS — E1065 Type 1 diabetes mellitus with hyperglycemia: Secondary | ICD-10-CM | POA: Diagnosis not present

## 2022-07-12 DIAGNOSIS — N1831 Chronic kidney disease, stage 3a: Secondary | ICD-10-CM | POA: Diagnosis not present

## 2022-07-12 LAB — T4, FREE: Free T4: 0.87 ng/dL (ref 0.60–1.60)

## 2022-07-12 LAB — TSH: TSH: 3.63 u[IU]/mL (ref 0.35–5.50)

## 2022-07-12 NOTE — Progress Notes (Signed)
Patient ID: Paul Adkins, male   DOB: 03/19/1945, 77 y.o.   MRN: [id number]   Reason for visit:   follow-up  Diagnosis: Type 1 diabetes, date of onset 1978  PAST history: He has had persistently poorly controlled diabetes for several years. A1c has been high with usual range 8.2 -9, overall improved since 2013. Compliance with glucose monitoring and diet has been variable and he does better when he is checking more sugars. His A1c was better than usual in 3/14 at 7.5 but he was also having significant hypoglycemia overnight and also occasionally later in the day. At that time he was started on Invokana 300 mg daily which helped him with glucose control and mild weight loss  CURRENT insulin pump brand: T-slim  PUMP SETTINGS are:   Basal rates: 2.8 from midnight- 4 AM. 4 AM = 2.75.  8 AM = 2.9.  8 PM = 3.2 Boluses : 1 unit for 4 g carbs at mealtimes  Glucose target 110-120, sensitivity 1: 20, with active insulin 5 hours.  Changing tubing every 1.9 days Recent insulin requirement average 149 units/day   RECENT history:   His A1c is slightly better at 7.5 compared to 7.7   Average from recent CGM 154  Current glycemic patterns, management and problems:  Glycemic patterns were reviewed from his T-Slim pump download  Interpretation of the Dexcom data from the pump download for the last 2 weeks as follows: On an average blood sugars are within the target range at all times, highest blood sugars are late afternoon and early evening but with moderate variability  Overnight blood sugars are averaging 150 most of the time with somewhat lower readings between 5-7 AM but no hypoglycemia  Premeal blood sugars are mildly increased at all times  POSTPRANDIAL readings after most meals are generally fairly well-controlled but sporadically will have readings going above 180 after different meals No hypoglycemia at any time  Rarely may have some periods of hyperglycemia for a few hours  unrelated to meals   Insulin pump management: He has relatively good blood sugar patterns as discussed above with GMI just over 7 His postprandial readings are overall controlled but sporadically will have high readings based on type of meal, possibly higher with relatively higher fat meals or inadequately estimating boluses Again likely has difficulty lowering his insulin requirement because of inactivity Recent insulin requirement is about 140 units daily He does try to bolus before starting to eat consistently Occasionally may have somewhat prolonged hyperglycemia requiring additional boluses but does not think he is having infusion set issues   CGM 2-week statistics:    CGM use % of time   2-week average/SD 154  Time in range    79    %  % Time Above 180 21  % Time above 250   % Time Below 70 0      PRE-MEAL  overnight  mornings  afternoon  evening Overall  Glucose range:       Averages: 146 149  172 151    Previously     CGM use % of time   2-week average/SD 153    Time in range 76    %  % Time Above 180 24  % Time above 250   % Time Below 70       PRE-MEAL  overnight  mornings  afternoon  evening Overall  Glucose range:       Averages: 157 150 144 160  Wt Readings from Last 3 Encounters:  07/12/22 (!) 305 lb 12.8 oz (138.7 kg)  05/05/22 (!) 305 lb (138.3 kg)  04/11/22 (!) 308 lb (139.7 kg)    Lab Results  Component Value Date   HGBA1C 7.5 (H) 07/10/2022   HGBA1C 7.7 (H) 04/07/2022   HGBA1C 7.3 (H) 12/20/2021   Lab Results  Component Value Date   MICROALBUR <0.7 09/12/2021   LDLCALC 39 12/20/2021   CREATININE 1.89 (H) 04/07/2022    OTHER problems addressed today are in review of systems    Appointment on 07/10/2022  Component Date Value Ref Range Status   WBC 07/10/2022 8.2  4.0 - 10.5 K/uL Final   RBC 07/10/2022 5.04  4.22 - 5.81 Mil/uL Final   Platelets 07/10/2022 236.0  150.0 - 400.0 K/uL Final   Hemoglobin 07/10/2022 15.0  13.0 -  17.0 g/dL Final   HCT 16/11/9602 44.6  39.0 - 52.0 % Final   MCV 07/10/2022 88.5  78.0 - 100.0 fl Final   MCHC 07/10/2022 33.6  30.0 - 36.0 g/dL Final   RDW 54/10/8117 14.4  11.5 - 15.5 % Final   Testosterone 07/10/2022 368.34  300.00 - 890.00 ng/dL Final   Glucose, Bld 14/78/2956 85  70 - 99 mg/dL Final   Hgb O1H MFr Bld 07/10/2022 7.5 (H)  4.6 - 6.5 % Final   Glycemic Control Guidelines for People with Diabetes:Non Diabetic:  <6%Goal of Therapy: <7%Additional Action Suggested:  >8%     Allergies as of 07/12/2022   No Known Allergies      Medication List        Accurate as of Jul 12, 2022 11:13 AM. If you have any questions, ask your nurse or doctor.          atorvastatin 20 MG tablet Commonly known as: LIPITOR TAKE 1 TABLET BY MOUTH EVERY DAY   carvedilol 6.25 MG tablet Commonly known as: COREG Take 6.25 mg by mouth 2 (two) times daily with a meal.   Comirnaty Susp injection Generic drug: COVID-19 mRNA Vac-TriS (Pfizer) Inject into the muscle.   furosemide 40 MG tablet Commonly known as: LASIX Take 2 per day   glucose blood test strip Commonly known as: Banker Next Test Use as instructed to check blood sugars 4 times per day dx code E10.65   insulin aspart 100 UNIT/ML injection Commonly known as: NovoLOG USE MAX OF 170 UNITS UNDER THE SKIN DAILY VIA INSULIN PUMP. E11.65   Jatenzo 237 MG Caps Generic drug: Testosterone Undecanoate 1 CAPSULE WITH BREAKFAST AND 1 WITH DINNER   methylphenidate 10 MG tablet Commonly known as: RITALIN Take 1 tablet up to twice a day   MIRALAX PO Take by mouth every other day.   olmesartan 40 MG tablet Commonly known as: BENICAR Take 40 mg by mouth daily.   PARoxetine 40 MG tablet Commonly known as: PAXIL TAKE 1 TABLET BY MOUTH EVERYDAY AT BEDTIME   spironolactone 50 MG tablet Commonly known as: ALDACTONE Take 50 mg by mouth daily.   VITAMIN D (CHOLECALCIFEROL) PO Take 6,000 Int'l Units/day by mouth.         Allergies:  No Known Allergies   Past Medical History:  Diagnosis Date   Carpal tunnel syndrome    Chronic kidney disease    Depression    Diabetes mellitus without complication (HCC)    ED (erectile dysfunction)    Edema    Hyperlipidemia    Hypertension    Lumbar disc disease  Neuropathy    Sleep apnea    Stroke [facility])     Past Surgical History:  Procedure Laterality Date   BACK SURGERY     VASECTOMY      Family History  Problem Relation Age of Onset   Osteoarthritis Mother    Cancer Father        Prostate   Cancer Brother    Drug abuse Brother    Diabetes Neg Hx     Social History:  reports that he has never smoked. He has never been exposed to tobacco smoke. He has never used smokeless tobacco. He reports that he does not drink alcohol and does not use drugs.  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   Had been on AndroGel 1.62% pump  He is still taking Jatenzo 237 mg twice a day  His energy level is low and he tends to have excessive somnolence Testosterone level is slightly lower than before without change in dosage History of some level taken 1-2 hours after his morning dose  Testosterone level as follows:   Lab Results  Component Value Date   TESTOSTERONE 368.34 07/10/2022    HYPERTENSION: treated with 40 mg Benicar and carvedilol unknown dose  He reports his blood pressure has been variable but generally high Occasionally will have relatively low blood pressure  readings and he thinks it is lower when he does a lot of walking Continues 40 mg on Benicar dose and now taking spironolactone also  He has been to the nephrologist regularly  Again taking Lasix 80 mg daily as prescribed by his nephrologist; he may sometimes sleep this off if his blood pressure gets relatively low Also Aldactone  BP Readings from Last 3 Encounters:  07/12/22 138/60  05/05/22 (!) 144/68  04/11/22 128/62     His creatinine is variable  No microalbuminuria  Lab Results  Component Value Date   CREATININE 1.89 (H) 04/07/2022   CREATININE 1.66 (H) 12/20/2021   CREATININE 1.90 (H) 09/12/2021     Hypercholesterolemia: Has been controlled on Lipitor 20 mg, has low HDL   Lab Results  Component Value Date   CHOL 98 12/20/2021   HDL 32.20 (L) 12/20/2021   LDLCALC 39 12/20/2021   LDLDIRECT 67.0 08/02/2015   TRIG 134.0 12/20/2021   CHOLHDL 3 12/20/2021    Has history of retinopathy, has regular follow-up with specialist Dr  Ashley Royalty  Sleepiness:  Lab Results  Component Value Date   TSH 4.10 06/07/2020   TSH 3.29 01/28/2019   TSH 2.70 07/30/2018   FREET4 0.73 03/16/2017     EXAM:   BP 138/60   Pulse 78   Resp 20   Ht 5\' 7"  (1.702 m)   Wt (!) 305 lb 12.8 oz (138.7 kg)   SpO2 95%   BMI 47.90 kg/m     ASSESSMENT/PLAN:  DIABETES Type I and obesity:  See history of present illness for detailed discussion on current blood sugar patterns from data obtained from download of devices and  problems identified  His A1c is 7.5  He has been using the T-slim pump using control IQ  Currently on NovoLog insulin  His A1c is not improving much compared to his last visit although recent time in range is 79% without hypoglycemia Again main difficulty is rather large insulin requirement of 140 units giving a 1 unit/kg requirement  Generally able to control his blood sugars well at mealtimes but occasionally will be unexpectedly higher Currently no change in  management needed He will again try to adjust his boluses based on type of meal and additional boluses for higher fat meals or unexpectedly high readings  HYPOGONADISM: Testosterone level is normal Unlikely hypogonadism as a cause of his somnolence  He will continue Jatenzo 238 mg daily   To have thyroid levels checked today  [id number] by nephrology and is now stable  HYPERTENSION: Blood pressure is managed by nephrologist and also requiring significant amounts of diuretics No orthostatic hypotension today but he can continue to adjust diuretics if his blood pressure is unexpectedly low   There are no Patient Instructions on file for this visit.   Reather Littler 07/12/2022

## 2022-07-12 NOTE — Addendum Note (Signed)
Addended by: Cleda Mccreedy F on: 07/12/2022 04:17 PM   Modules accepted: Orders

## 2022-07-12 NOTE — Addendum Note (Signed)
Addended by: Cleda Mccreedy F on: 07/12/2022 04:06 PM   Modules accepted: Orders

## 2022-07-14 ENCOUNTER — Other Ambulatory Visit: Payer: Self-pay | Admitting: Family

## 2022-07-14 ENCOUNTER — Encounter: Payer: Self-pay | Admitting: Endocrinology

## 2022-07-14 ENCOUNTER — Encounter: Payer: Self-pay | Admitting: Family

## 2022-07-14 DIAGNOSIS — M545 Low back pain, unspecified: Secondary | ICD-10-CM

## 2022-07-17 ENCOUNTER — Ambulatory Visit (HOSPITAL_BASED_OUTPATIENT_CLINIC_OR_DEPARTMENT_OTHER)
Admission: RE | Admit: 2022-07-17 | Discharge: 2022-07-17 | Disposition: A | Discharge status: Home / Self Care | Payer: Medicare PPO | Source: Ambulatory Visit | Attending: Family | Admitting: Family

## 2022-07-17 DIAGNOSIS — M545 Low back pain, unspecified: Secondary | ICD-10-CM | POA: Insufficient documentation

## 2022-07-17 DIAGNOSIS — G8929 Other chronic pain: Secondary | ICD-10-CM

## 2022-07-18 ENCOUNTER — Encounter: Payer: Medicare PPO | Attending: Endocrinology | Admitting: Nutrition

## 2022-07-18 DIAGNOSIS — E108 Type 1 diabetes mellitus with unspecified complications: Secondary | ICD-10-CM | POA: Insufficient documentation

## 2022-07-21 ENCOUNTER — Other Ambulatory Visit: Payer: Self-pay | Admitting: Family

## 2022-07-21 DIAGNOSIS — G8929 Other chronic pain: Secondary | ICD-10-CM

## 2022-07-26 ENCOUNTER — Encounter: Payer: Medicare PPO | Admitting: Nutrition

## 2022-07-26 DIAGNOSIS — E108 Type 1 diabetes mellitus with unspecified complications: Secondary | ICD-10-CM

## 2022-07-26 NOTE — Progress Notes (Signed)
Patient is using the g6 sensors but had no clarity app on his phone.  This was set up and linked to [facility] His pump was linked to tandem source and he was reminded to keep his phone app open at all times.  He had no final questions. WE also reviewed how to update his pump software to use the G7 sensors.  Written instructions given for this.

## 2022-07-26 NOTE — Patient Instructions (Signed)
Keep Tandem source app open at all times

## 2022-08-10 DIAGNOSIS — I129 Hypertensive chronic kidney disease with stage 1 through stage 4 chronic kidney disease, or unspecified chronic kidney disease: Secondary | ICD-10-CM | POA: Diagnosis not present

## 2022-08-10 DIAGNOSIS — N1832 Chronic kidney disease, stage 3b: Secondary | ICD-10-CM | POA: Diagnosis not present

## 2022-08-10 DIAGNOSIS — N1831 Chronic kidney disease, stage 3a: Secondary | ICD-10-CM | POA: Diagnosis not present

## 2022-08-10 DIAGNOSIS — G629 Polyneuropathy, unspecified: Secondary | ICD-10-CM | POA: Diagnosis not present

## 2022-08-10 DIAGNOSIS — E109 Type 1 diabetes mellitus without complications: Secondary | ICD-10-CM | POA: Diagnosis not present

## 2022-08-11 DIAGNOSIS — Z981 Arthrodesis status: Secondary | ICD-10-CM | POA: Diagnosis not present

## 2022-08-11 DIAGNOSIS — M5441 Lumbago with sciatica, right side: Secondary | ICD-10-CM | POA: Diagnosis not present

## 2022-08-11 DIAGNOSIS — E1065 Type 1 diabetes mellitus with hyperglycemia: Secondary | ICD-10-CM | POA: Diagnosis not present

## 2022-08-11 DIAGNOSIS — G8929 Other chronic pain: Secondary | ICD-10-CM | POA: Diagnosis not present

## 2022-08-11 DIAGNOSIS — M5442 Lumbago with sciatica, left side: Secondary | ICD-10-CM | POA: Diagnosis not present

## 2022-08-16 ENCOUNTER — Other Ambulatory Visit: Payer: Self-pay | Admitting: Neurosurgery

## 2022-08-16 DIAGNOSIS — G8929 Other chronic pain: Secondary | ICD-10-CM

## 2022-08-17 DIAGNOSIS — E1065 Type 1 diabetes mellitus with hyperglycemia: Secondary | ICD-10-CM | POA: Diagnosis not present

## 2022-08-18 LAB — LAB REPORT - SCANNED
Albumin, Urine POC: 6
Creatinine, POC: 122.4 mg/dL
EGFR: 34
Microalb Creat Ratio: 5

## 2022-08-21 LAB — BASIC METABOLIC PANEL
BUN: 56 — AB (ref 4–21)
CO2: 25 — AB (ref 13–22)
Chloride: 105 (ref 99–108)
Glucose: 175
Potassium: 5.4 mEq/L — AB (ref 3.5–5.1)
Sodium: 140 (ref 137–147)

## 2022-08-21 LAB — MICROALBUMIN / CREATININE URINE RATIO: Microalb Creat Ratio: 5

## 2022-08-21 LAB — CBC AND DIFFERENTIAL: WBC: -5

## 2022-08-21 LAB — COMPREHENSIVE METABOLIC PANEL
Albumin: 4.1 (ref 3.5–5.0)
Calcium: 9.5 (ref 8.7–10.7)
Globulin: 2.3
eGFR: 34

## 2022-08-21 LAB — PROTEIN / CREATININE RATIO, URINE
Albumin, U: 6
Creatinine, Urine: 122.4

## 2022-09-03 ENCOUNTER — Ambulatory Visit
Admission: RE | Admit: 2022-09-03 | Discharge: 2022-09-03 | Disposition: A | Discharge status: Home / Self Care | Payer: Medicare PPO | Source: Ambulatory Visit | Attending: Neurosurgery | Admitting: Neurosurgery

## 2022-09-03 DIAGNOSIS — Z981 Arthrodesis status: Secondary | ICD-10-CM | POA: Diagnosis not present

## 2022-09-03 DIAGNOSIS — G8929 Other chronic pain: Secondary | ICD-10-CM

## 2022-09-03 DIAGNOSIS — M48061 Spinal stenosis, lumbar region without neurogenic claudication: Secondary | ICD-10-CM | POA: Diagnosis not present

## 2022-09-04 ENCOUNTER — Encounter (INDEPENDENT_AMBULATORY_CARE_PROVIDER_SITE_OTHER): Payer: Medicare PPO | Admitting: Ophthalmology

## 2022-09-04 DIAGNOSIS — H35033 Hypertensive retinopathy, bilateral: Secondary | ICD-10-CM

## 2022-09-04 DIAGNOSIS — E113592 Type 2 diabetes mellitus with proliferative diabetic retinopathy without macular edema, left eye: Secondary | ICD-10-CM

## 2022-09-04 DIAGNOSIS — H43813 Vitreous degeneration, bilateral: Secondary | ICD-10-CM

## 2022-09-04 DIAGNOSIS — I10 Essential (primary) hypertension: Secondary | ICD-10-CM

## 2022-09-04 DIAGNOSIS — D3132 Benign neoplasm of left choroid: Secondary | ICD-10-CM | POA: Diagnosis not present

## 2022-09-04 DIAGNOSIS — E113391 Type 2 diabetes mellitus with moderate nonproliferative diabetic retinopathy without macular edema, right eye: Secondary | ICD-10-CM | POA: Diagnosis not present

## 2022-09-06 ENCOUNTER — Telehealth: Payer: Self-pay | Admitting: Emergency Medicine

## 2022-09-06 ENCOUNTER — Ambulatory Visit (INDEPENDENT_AMBULATORY_CARE_PROVIDER_SITE_OTHER): Payer: Medicare PPO | Admitting: Emergency Medicine

## 2022-09-06 VITALS — Ht 67.0 in | Wt 305.0 lb

## 2022-09-06 DIAGNOSIS — Z0000 Encounter for general adult medical examination without abnormal findings: Secondary | ICD-10-CM

## 2022-09-06 NOTE — Telephone Encounter (Signed)
Requesting copy of patient's most recent eye exam done 09/05/22. Thank you.

## 2022-09-06 NOTE — Progress Notes (Addendum)
Subjective:   Paul Adkins is a 77 y.o. male who presents for Medicare Annual/Subsequent preventive examination.  Visit Complete: Virtual  I connected with  Taryn Weilbacher on 09/06/22 by a audio enabled telemedicine application and verified that I am speaking with the correct person using two identifiers.  Patient Location: Home  Provider Location: Home Office  I discussed the limitations of evaluation and management by telemedicine. The patient expressed understanding and agreed to proceed.  Patient Medicare AWV questionnaire was completed by the patient on 09/05/22; I have confirmed that all information answered by patient is correct and no changes since this date.  Review of Systems     Cardiac Risk Factors include: advanced age (>66men, >49 women);hypertension;obesity (BMI >30kg/m2);diabetes mellitus;dyslipidemia;male gender;Other (see comment), Risk factor comments: OSA, wears CPAP     Objective:    Today's Vitals   09/06/22 1345  Weight: (!) 305 lb (138.3 kg)  Height: 5\' 7"  (1.702 m)   Body mass index is 47.77 kg/m.     09/06/2022    2:01 PM 01/11/2022    8:58 AM 09/05/2021    9:05 AM 06/21/2021    9:09 AM 09/30/2020   10:56 AM 10/20/2015    1:34 PM  Advanced Directives  Does Patient Have a Medical Advance Directive? Yes Yes Yes Yes Yes No  Type of Estate agent of [place];Living will Healthcare Power of [place];Living will Healthcare Power of [place];Living will Healthcare Power of [place];Living will    Does patient want to make changes to medical advance directive? No - Patient declined No - Patient declined  No - Patient declined    Copy of Healthcare Power of Attorney in Chart? No - copy requested No - copy requested No - copy requested No - copy requested      Current Medications (verified) Outpatient Encounter Medications as of 09/06/2022  Medication Sig   atorvastatin (LIPITOR) 20 MG tablet TAKE 1 TABLET BY MOUTH EVERY DAY   carvedilol  (COREG) 6.25 MG tablet Take 6.25 mg by mouth 2 (two) times daily with a meal.   furosemide (LASIX) 40 MG tablet Take 2 per day   glucose blood (BAYER CONTOUR NEXT TEST) test strip Use as instructed to check blood sugars 4 times per day dx code E10.65   insulin aspart (NOVOLOG) 100 UNIT/ML injection USE MAX OF 170 UNITS UNDER THE SKIN DAILY VIA INSULIN PUMP. E11.65   JATENZO 237 MG CAPS 1 CAPSULE WITH BREAKFAST AND 1 WITH DINNER   methylphenidate (RITALIN) 10 MG tablet Take 1 tablet up to twice a day   olmesartan (BENICAR) 40 MG tablet Take 40 mg by mouth daily.   PARoxetine (PAXIL) 40 MG tablet TAKE 1 TABLET BY MOUTH EVERYDAY AT BEDTIME   Polyethylene Glycol 3350 (MIRALAX PO) Take by mouth every other day.   spironolactone (ALDACTONE) 50 MG tablet Take 50 mg by mouth daily.   VITAMIN D, CHOLECALCIFEROL, PO Take 6,000 Int'l Units/day by mouth.   COMIRNATY SUSP injection Inject into the muscle. (Patient not taking: Reported on 09/06/2022)   No facility-administered encounter medications on file as of 09/06/2022.    Allergies (verified) Patient has no known allergies.   History: Past Medical History:  Diagnosis Date   Carpal tunnel syndrome    Chronic kidney disease    Depression    Diabetes mellitus without complication (HCC)    ED (erectile dysfunction)    Edema    Hyperlipidemia    Hypertension    Lumbar disc disease  Neuropathy    Sleep apnea    Stroke [facility])    Past Surgical History:  Procedure Laterality Date   BACK SURGERY     VASECTOMY     Family History  Problem Relation Age of Onset   Osteoarthritis Mother    Cancer Father        Prostate   Cancer Brother    Drug abuse Brother    Diabetes Neg Hx    Social History   Socioeconomic History   Marital status: Single    Spouse name: Not on file   Number of children: Not on file   Years of education: Not on file   Highest education level: Not on file  Occupational History   Occupation: retired    Comment:  Doctor, 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, Music  Tobacco Use   Smoking status: Never    Passive exposure: Never   Smokeless tobacco: Never  Vaping Use   Vaping Use: Never used  Substance and Sexual Activity   Alcohol use: No   Drug use: No   Sexual activity: Not on file  Other Topics Concern   Not on file  Social History Narrative   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 [employer] Strain: Low Risk  (09/05/2022)   Overall Financial Resource Strain (CARDIA)    Difficulty of Paying Living Expenses: Not hard at all  Food Insecurity: No Food Insecurity (09/05/2022)   Hunger Vital Sign    Worried About Running Out of Food in the Last Year: Never true    Ran Out of Food in the Last Year: Never true  Transportation Needs: No Transportation Needs (09/05/2022)   PRAPARE - Administrator, Civil Service (Medical): No    Lack of Transportation (Non-Medical): No  Physical Activity: Inactive (09/05/2022)   Exercise Vital Sign    Days of Exercise per Week: 0 days    Minutes of Exercise per Session: 0 min  Stress: No Stress Concern Present (09/05/2022)   [employer] of Occupational Health - Occupational Stress Questionnaire    Feeling of Stress : Only a little  Social Connections: Unknown (09/05/2022)   Social Connection and Isolation Panel [NHANES]    Frequency of Communication with Friends and Family: Twice a week    Frequency of Social Gatherings with Friends and Family: Once a week    Attends Religious Services: Never    Database administrator or Organizations: Yes    Attends Engineer, structural: More than 4 times per year    Marital Status: Patient declined    Tobacco Counseling N/A   Clinical Intake:              How often do you need to have someone help you when you read instructions, pamphlets, or other written materials from your doctor or pharmacy?: (P) 1 - Never         Activities of Daily  Living    09/05/2022    6:31 PM  In your present state of health, do you have any difficulty performing the following activities:  Hearing? 1  Comment wears hearing aids  Vision? 0  Difficulty concentrating or making decisions? 0  Walking or climbing stairs? 1  Comment patient has a cane, but doesn't use  Dressing or bathing? 0  Doing errands, shopping? 0  Preparing Food and eating ? N  Using the Toilet? N  In the past six months, have  you accidently leaked urine? Y  Do you have problems with loss of bowel control? N  Managing your Medications? N  Managing your Finances? N  Housekeeping or managing your Housekeeping? Y  Comment has a cleaning person    Patient Care Team: Olive Bass, FNP as PCP - General (Internal Medicine) Reather Littler, MD as Consulting Physician (Endocrinology)  Indicate any recent Medical Services you may have received from other than [facility] providers in the past year (date may be approximate).     Assessment:   This is a routine wellness examination for Rahshawn.  Hearing/Vision screen Hearing Screening - Comments:: Wears hearing aids  Dietary issues and exercise activities discussed:     Goals Addressed               This Visit's Progress     Patient Stated   Not on track     Would like to lose some weight & walk more      Weight (lb) < 285 lb (129.3 kg) (pt-stated)   305 lb (138.3 kg)     Patient would like to lose weight      Depression Screen    09/06/2022    1:57 PM 05/05/2022   10:18 AM 09/05/2021    9:08 AM 09/30/2020   10:55 AM  PHQ 2/9 Scores  PHQ - 2 Score 0 0 0 0  PHQ- 9 Score 0       Fall Risk    09/05/2022    6:31 PM 05/05/2022   10:18 AM 12/22/2021    9:07 AM 09/05/2021    9:06 AM 09/30/2020   10:55 AM  Fall Risk   Falls in the past year? 1 1 0 0 0  Number falls in past yr: 1 0 0 0   Injury with Fall? 0 0 0 0   Risk for fall due to : History of fall(s);Impaired mobility;Impaired vision;Orthopedic patient History of  fall(s) No Fall Risks    Follow up Falls evaluation completed;Education provided;Falls prevention discussed Falls evaluation completed Falls evaluation completed Falls prevention discussed     MEDICARE RISK AT HOME:   TIMED UP AND GO:  Was the test performed?  No    Cognitive Function:        09/06/2022    2:03 PM  6CIT Screen  What Year? 0 points  What month? 0 points  What time? 0 points  Count back from 20 0 points  Months in reverse 0 points  Repeat phrase 0 points  Total Score 0 points    Immunizations Immunization History  Administered Date(s) Administered   Fluad Quad(high Dose 65+) 12/17/2018, 12/10/2020, 12/22/2021   Influenza, High Dose Seasonal PF 11/25/2015, 12/14/2016, 12/17/2018, 12/13/2019, 12/10/2020   Influenza,inj,Quad PF,6+ Mos 12/05/2012, 11/26/2013, 11/11/2014, 11/02/2017   PFIZER(Purple Top)SARS-COV-2 Vaccination 04/10/2019, 05/05/2019, 12/15/2019   PNEUMOCOCCAL CONJUGATE-20 07/03/2020, 06/21/2021   Pfizer Covid-19 Vaccine Bivalent Booster 67yrs & up 11/02/2020   Pneumococcal Polysaccharide-23 01/21/2020   Tdap 02/25/2014   Zoster Recombinant(Shingrix) 01/31/2019, 08/07/2019    TDAP status: Up to date  Flu Vaccine status: Up to date  Pneumococcal vaccine status: Up to date  Covid-19 vaccine status: Completed vaccines  Qualifies for Shingles Vaccine? Yes   Zostavax completed No   Shingrix Completed?: Yes  Screening Tests Health Maintenance  Topic Date Due   Hepatitis C Screening  Never done   FOOT EXAM  04/28/2018   OPHTHALMOLOGY EXAM  08/05/2020   COVID-19 Vaccine (5 - 2023-24 season) 10/28/2021  INFLUENZA VACCINE  09/28/2022   HEMOGLOBIN A1C  01/10/2023   Diabetic kidney evaluation - eGFR measurement  08/21/2023   Diabetic kidney evaluation - Urine ACR  08/21/2023   Medicare Annual Wellness (AWV)  09/06/2023   DTaP/Tdap/Td (2 - Td or Tdap) 02/26/2024   Colonoscopy  12/04/2029   Pneumonia Vaccine 41+ Years old  Completed    Zoster Vaccines- Shingrix  Completed   HPV VACCINES  Aged Out    Health Maintenance  Health Maintenance Due  Topic Date Due   Hepatitis C Screening  Never done   FOOT EXAM  04/28/2018   OPHTHALMOLOGY EXAM  08/05/2020   COVID-19 Vaccine (5 - 2023-24 season) 10/28/2021    Colorectal cancer screening: Type of screening: Colonoscopy. Completed 12/05/19. Repeat every 10 years  Lung Cancer Screening: (Low Dose CT Chest recommended if Age 84-80 years, 20 pack-year currently smoking OR have quit w/in 15years.) does not qualify.   Lung Cancer Screening Referral: n/a  Additional Screening:  Hepatitis C Screening: does qualify; Will get with next labs  Vision Screening: Recommended annual ophthalmology exams for early detection of glaucoma and other disorders of the eye. Is the patient up to date with their annual eye exam?  Yes , last exam 09/04/22 Who is the provider or what is the name of the office in which the patient attends annual eye exams? Alan Mulder, DO. If pt is not established with a provider, would they like to be referred to a provider to establish care? No .   Dental Screening: Recommended annual dental exams for proper oral hygiene  Diabetic Foot Exam: Diabetic Foot Exam: Overdue, Pt has been advised about the importance in completing this exam. Pt is scheduled for diabetic foot exam on . Patient states he has not had a formal foot exam in many years. Patient does do self checks on his feet.  Community Resource Referral / Chronic Care Management: CRR required this visit?  No   CCM required this visit?  No     Plan:     I have personally reviewed and noted the following in the patient's chart:   Medical and social history Use of alcohol, tobacco or illicit drugs  Current medications and supplements including opioid prescriptions. Patient is not currently taking opioid prescriptions. Functional ability and status Nutritional status Physical activity Advanced  directives List of other physicians Hospitalizations, surgeries, and ER visits in previous 12 months Vitals Screenings to include cognitive, depression, and falls Referrals and appointments  In addition, I have reviewed and discussed with patient certain preventive protocols, quality metrics, and best practice recommendations. A written personalized care plan for preventive services as well as general preventive health recommendations were provided to patient.     Tora Kindred, CMA   09/06/2022   After Visit Summary: (MyChart) Due to this being a telephonic visit, the after visit summary with patients personalized plan was offered to patient via MyChart   Nurse Notes:  Patient is interested in getting the Hepatitis C screening with next labs. Patient is past due to foot exam. He states he has not had one in "years" and doesn't think he needs one. He does states that he checks his feet for wounds or sores. He states he will tell the doctor when he is having an issue. Requested last eye exam results.

## 2022-09-06 NOTE — Telephone Encounter (Signed)
Please send copy of most recent eye exam from 09/05/22. Thank you.

## 2022-09-06 NOTE — Patient Instructions (Signed)
[name] , Thank you for taking time to come for your Medicare Wellness Visit. I appreciate your ongoing commitment to your health goals. Please review the following plan we discussed and let me know if I can assist you in the future.   These are the goals we discussed:  Goals       Patient Stated      Would like to lose some weight & walk more      Weight (lb) < 285 lb (129.3 kg) (pt-stated)      Patient would like to lose weight        This is a list of the screening recommended for you and due dates:  Health Maintenance  Topic Date Due   Hepatitis C Screening  Never done   Complete foot exam   04/28/2018   Eye exam for diabetics  08/05/2020   COVID-19 Vaccine (5 - 2023-24 season) 10/28/2021   Flu Shot  09/28/2022   Hemoglobin A1C  01/10/2023   Yearly kidney function blood test for diabetes  08/21/2023   Yearly kidney health urinalysis for diabetes  08/21/2023   Medicare Annual Wellness Visit  09/06/2023   DTaP/Tdap/Td vaccine (2 - Td or Tdap) 02/26/2024   Colon Cancer Screening  12/04/2029   Pneumonia Vaccine  Completed   Zoster (Shingles) Vaccine  Completed   HPV Vaccine  Aged Out    Advanced directives: Please bring a copy of your health care power of attorney and living will to the office to be added to your chart at your convenience.   Conditions/risks identified: Hepatitis C screening with next labs.  Next appointment: Follow up in one year for your annual wellness visit. 09/11/23  Preventive Care 65 Years and Older, Male  Preventive care refers to lifestyle choices and visits with your health care provider that can promote health and wellness. What does preventive care include? A yearly physical exam. This is also called an annual well check. Dental exams once or twice a year. Routine eye exams. Ask your health care provider how often you should have your eyes checked. Personal lifestyle choices, including: Daily care of your teeth and gums. Regular  physical activity. Eating a healthy diet. Avoiding tobacco and drug use. Limiting alcohol use. Practicing safe sex. Taking low doses of aspirin every day. Taking vitamin and mineral supplements as recommended by your health care provider. What happens during an annual well check? The services and screenings done by your health care provider during your annual well check will depend on your age, overall health, lifestyle risk factors, and family history of disease. Counseling  Your health care provider may ask you questions about your: Alcohol use. Tobacco use. Drug use. Emotional well-being. Home and relationship well-being. Sexual activity. Eating habits. History of falls. Memory and ability to understand (cognition). Work and work Astronomer. Screening  You may have the following tests or measurements: Height, weight, and BMI. Blood pressure. Lipid and cholesterol levels. These may be checked every 5 years, or more frequently if you are over 78 years old. Skin check. Lung cancer screening. You may have this screening every year starting at age 81 if you have a 30-pack-year history of smoking and currently smoke or have quit within the past 15 years. Fecal occult blood test (FOBT) of the stool. You may have this test every year starting at age 27. Flexible sigmoidoscopy or colonoscopy. You may have a sigmoidoscopy every 5 years or a colonoscopy every 10 years starting at age 11.  Prostate cancer screening. Recommendations will vary depending on your family history and other risks. Hepatitis C blood test. Hepatitis B blood test. Sexually transmitted disease (STD) testing. Diabetes screening. This is done by checking your blood sugar (glucose) after you have not eaten for a while (fasting). You may have this done every 1-3 years. Abdominal aortic aneurysm (AAA) screening. You may need this if you are a current or former smoker. Osteoporosis. You may be screened starting at age 40  if you are at high risk. Talk with your health care provider about your test results, treatment options, and if necessary, the need for more tests. Vaccines  Your health care provider may recommend certain vaccines, such as: Influenza vaccine. This is recommended every year. Tetanus, diphtheria, and acellular pertussis (Tdap, Td) vaccine. You may need a Td booster every 10 years. Zoster vaccine. You may need this after age 50. Pneumococcal 13-valent conjugate (PCV13) vaccine. One dose is recommended after age 76. Pneumococcal polysaccharide (PPSV23) vaccine. One dose is recommended after age 42. Talk to your health care provider about which screenings and vaccines you need and how often you need them. This information is not intended to replace advice given to you by your health care provider. Make sure you discuss any questions you have with your health care provider. Document Released: 03/12/2015 Document Revised: 11/03/2015 Document Reviewed: 12/15/2014 Elsevier Interactive Patient Education  2017 [employer].  Fall Prevention in the Home Falls can cause injuries. They can happen to people of all ages. There are many things you can do to make your home safe and to help prevent falls. What can I do on the outside of my home? Regularly fix the edges of walkways and driveways and fix any cracks. Remove anything that might make you trip as you walk through a door, such as a raised step or threshold. Trim any bushes or trees on the path to your home. Use bright outdoor lighting. Clear any walking paths of anything that might make someone trip, such as rocks or tools. Regularly check to see if handrails are loose or broken. Make sure that both sides of any steps have handrails. Any raised decks and porches should have guardrails on the edges. Have any leaves, snow, or ice cleared regularly. Use sand or salt on walking paths during winter. Clean up any spills in your garage right away. This  includes oil or grease spills. What can I do in the bathroom? Use night lights. Install grab bars by the toilet and in the tub and shower. Do not use towel bars as grab bars. Use non-skid mats or decals in the tub or shower. If you need to sit down in the shower, use a plastic, non-slip stool. Keep the floor dry. Clean up any water that spills on the floor as soon as it happens. Remove soap buildup in the tub or shower regularly. Attach bath mats securely with double-sided non-slip rug tape. Do not have throw rugs and other things on the floor that can make you trip. What can I do in the bedroom? Use night lights. Make sure that you have a light by your bed that is easy to reach. Do not use any sheets or blankets that are too big for your bed. They should not hang down onto the floor. Have a firm chair that has side arms. You can use this for support while you get dressed. Do not have throw rugs and other things on the floor that can make you trip.  What can I do in the kitchen? Clean up any spills right away. Avoid walking on wet floors. Keep items that you use a lot in easy-to-reach places. If you need to reach something above you, use a strong step stool that has a grab bar. Keep electrical cords out of the way. Do not use floor polish or wax that makes floors slippery. If you must use wax, use non-skid floor wax. Do not have throw rugs and other things on the floor that can make you trip. What can I do with my stairs? Do not leave any items on the stairs. Make sure that there are handrails on both sides of the stairs and use them. Fix handrails that are broken or loose. Make sure that handrails are as long as the stairways. Check any carpeting to make sure that it is firmly attached to the stairs. Fix any carpet that is loose or worn. Avoid having throw rugs at the top or bottom of the stairs. If you do have throw rugs, attach them to the floor with carpet tape. Make sure that you  have a light switch at the top of the stairs and the bottom of the stairs. If you do not have them, ask someone to add them for you. What else can I do to help prevent falls? Wear shoes that: Do not have high heels. Have rubber bottoms. Are comfortable and fit you well. Are closed at the toe. Do not wear sandals. If you use a stepladder: Make sure that it is fully opened. Do not climb a closed stepladder. Make sure that both sides of the stepladder are locked into place. Ask someone to hold it for you, if possible. Clearly mark and make sure that you can see: Any grab bars or handrails. First and last steps. Where the edge of each step is. Use tools that help you move around (mobility aids) if they are needed. These include: Canes. Walkers. Scooters. Crutches. Turn on the lights when you go into a dark area. Replace any light bulbs as soon as they burn out. Set up your furniture so you have a clear path. Avoid moving your furniture around. If any of your floors are uneven, fix them. If there are any pets around you, be aware of where they are. Review your medicines with your doctor. Some medicines can make you feel dizzy. This can increase your chance of falling. Ask your doctor what other things that you can do to help prevent falls. This information is not intended to replace advice given to you by your health care provider. Make sure you discuss any questions you have with your health care provider. Document Released: 12/10/2008 Document Revised: 07/22/2015 Document Reviewed: 03/20/2014 Elsevier Interactive Patient Education  2017 [employer]   Calorie Counting for [employer] Loss Calories are units of energy. Your body needs a certain number of calories from food to keep going throughout the day. When you eat or drink more calories than your body needs, your body stores the extra calories mostly as fat. When you eat or drink fewer calories than your body needs, your body burns fat  to get the energy it needs. Calorie counting means keeping track of how many calories you eat and drink each day. Calorie counting can be helpful if you need to lose weight. If you eat fewer calories than your body needs, you should lose weight. Ask your health care provider what a healthy weight is for you. For calorie counting to work,  you will need to eat the right number of calories each day to lose a healthy amount of weight per week. A dietitian can help you figure out how many calories you need in a day and will suggest ways to reach your calorie goal. A healthy amount of weight to lose each week is usually 1-2 lb (0.5-0.9 kg). This usually means that your daily calorie intake should be reduced by 500-750 calories. Eating 1,200-1,500 calories a day can help most women lose weight. Eating 1,500-1,800 calories a day can help most men lose weight. What do I need to know about calorie counting? Work with your health care provider or dietitian to determine how many calories you should get each day. To meet your daily calorie goal, you will need to: Find out how many calories are in each food that you would like to eat. Try to do this before you eat. Decide how much of the food you plan to eat. Keep a food log. Do this by writing down what you ate and how many calories it had. To successfully lose weight, it is important to balance calorie counting with a healthy lifestyle that includes regular activity. Where do I find calorie information?  The number of calories in a food can be found on a Nutrition Facts label. If a food does not have a Nutrition Facts label, try to look up the calories online or ask your dietitian for help. Remember that calories are listed per serving. If you choose to have more than one serving of a food, you will have to multiply the calories per serving by the number of servings you plan to eat. For example, the label on a package of bread might say that a serving size is 1  slice and that there are 90 calories in a serving. If you eat 1 slice, you will have eaten 90 calories. If you eat 2 slices, you will have eaten 180 calories. How do I keep a food log? After each time that you eat, record the following in your food log as soon as possible: What you ate. Be sure to include toppings, sauces, and other extras on the food. How much you ate. This can be measured in cups, ounces, or number of items. How many calories were in each food and drink. The total number of calories in the food you ate. Keep your food log near you, such as in a pocket-sized notebook or on an app or website on your mobile phone. Some programs will calculate calories for you and show you how many calories you have left to meet your daily goal. What are some portion-control tips? Know how many calories are in a serving. This will help you know how many servings you can have of a certain food. Use a measuring cup to measure serving sizes. You could also try weighing out portions on a kitchen scale. With time, you will be able to estimate serving sizes for some foods. Take time to put servings of different foods on your favorite plates or in your favorite bowls and cups so you know what a serving looks like. Try not to eat straight from a food's packaging, such as from a bag or box. Eating straight from the package makes it hard to see how much you are eating and can lead to overeating. Put the amount you would like to eat in a cup or on a plate to make sure you are eating the right portion. Use smaller plates,  glasses, and bowls for smaller portions and to prevent overeating. Try not to multitask. For example, avoid watching TV or using your computer while eating. If it is time to eat, sit down at a table and enjoy your food. This will help you recognize when you are full. It will also help you be more mindful of what and how much you are eating. What are tips for following this plan? Reading food  labels Check the calorie count compared with the serving size. The serving size may be smaller than what you are used to eating. Check the source of the calories. Try to choose foods that are high in protein, fiber, and vitamins, and low in saturated fat, trans fat, and sodium. Shopping Read nutrition labels while you shop. This will help you make healthy decisions about which foods to buy. Pay attention to nutrition labels for low-fat or fat-free foods. These foods sometimes have the same number of calories or more calories than the full-fat versions. They also often have added sugar, starch, or salt to make up for flavor that was removed with the fat. Make a grocery list of lower-calorie foods and stick to it. Cooking Try to cook your favorite foods in a healthier way. For example, try baking instead of frying. Use low-fat dairy products. Meal planning Use more fruits and vegetables. One-half of your plate should be fruits and vegetables. Include lean proteins, such as chicken, [place], and fish. Lifestyle Each week, aim to do one of the following: 150 minutes of moderate exercise, such as walking. 75 minutes of vigorous exercise, such as running. General information Know how many calories are in the foods you eat most often. This will help you calculate calorie counts faster. Find a way of tracking calories that works for you. Get creative. Try different apps or programs if writing down calories does not work for you. What foods should I eat?  Eat nutritious foods. It is better to have a nutritious, high-calorie food, such as an avocado, than a food with few nutrients, such as a bag of potato chips. Use your calories on foods and drinks that will fill you up and will not leave you hungry soon after eating. Examples of foods that fill you up are nuts and nut butters, vegetables, lean proteins, and high-fiber foods such as whole grains. High-fiber foods are foods with more than 5 g of fiber  per serving. Pay attention to calories in drinks. Low-calorie drinks include water and unsweetened drinks. The items listed above may not be a complete list of foods and beverages you can eat. Contact a dietitian for more information. What foods should I limit? Limit foods or drinks that are not good sources of vitamins, minerals, or protein or that are high in unhealthy fats. These include: Candy. Other sweets. Sodas, specialty coffee drinks, alcohol, and juice. The items listed above may not be a complete list of foods and beverages you should avoid. Contact a dietitian for more information. How do I count calories when eating out? Pay attention to portions. Often, portions are much larger when eating out. Try these tips to keep portions smaller: Consider sharing a meal instead of getting your own. If you get your own meal, eat only half of it. Before you start eating, ask for a container and put half of your meal into it. When available, consider ordering smaller portions from the menu instead of full portions. Pay attention to your food and drink choices. Knowing the way food is  cooked and what is included with the meal can help you eat fewer calories. If calories are listed on the menu, choose the lower-calorie options. Choose dishes that include vegetables, fruits, whole grains, low-fat dairy products, and lean proteins. Choose items that are boiled, broiled, grilled, or steamed. Avoid items that are buttered, battered, fried, or served with cream sauce. Items labeled as crispy are usually fried, unless stated otherwise. Choose water, low-fat milk, unsweetened iced tea, or other drinks without added sugar. If you want an alcoholic beverage, choose a lower-calorie option, such as a glass of wine or light beer. Ask for dressings, sauces, and syrups on the side. These are usually high in calories, so you should limit the amount you eat. If you want a salad, choose a garden salad and ask for  grilled meats. Avoid extra toppings such as bacon, cheese, or fried items. Ask for the dressing on the side, or ask for olive oil and vinegar or lemon to use as dressing. Estimate how many servings of a food you are given. Knowing serving sizes will help you be aware of how much food you are eating at restaurants. Where to find more information Centers for Disease Control and Prevention: FootballExhibition.com.br [place] Department of Agriculture: WrestlingReporter.dk Summary Calorie counting means keeping track of how many calories you eat and drink each day. If you eat fewer calories than your body needs, you should lose weight. A healthy amount of weight to lose per week is usually 1-2 lb (0.5-0.9 kg). This usually means reducing your daily calorie intake by 500-750 calories. The number of calories in a food can be found on a Nutrition Facts label. If a food does not have a Nutrition Facts label, try to look up the calories online or ask your dietitian for help. Use smaller plates, glasses, and bowls for smaller portions and to prevent overeating. Use your calories on foods and drinks that will fill you up and not leave you hungry shortly after a meal. This information is not intended to replace advice given to you by your health care provider. Make sure you discuss any questions you have with your health care provider. Document Revised: 03/27/2019 Document Reviewed: 03/27/2019 Elsevier Patient Education  2023 Else[employer] Inc.  Diabetes Mellitus and Nutrition, Adult When you have diabetes, or diabetes mellitus, it is very important to have healthy eating habits because your blood sugar (glucose) levels are greatly affected by what you eat and drink. Eating healthy foods in the right amounts, at about the same times every day, can help you: Manage your blood glucose. Lower your risk of heart disease. Improve your blood pressure. Reach or maintain a healthy weight. What can affect my meal plan? Every person with diabetes  is different, and each person has different needs for a meal plan. Your health care provider may recommend that you work with a dietitian to make a meal plan that is best for you. Your meal plan may vary depending on factors such as: The calories you need. The medicines you take. Your weight. Your blood glucose, blood pressure, and cholesterol levels. Your activity level. Other health conditions you have, such as heart or kidney disease. How do carbohydrates affect me? Carbohydrates, also called carbs, affect your blood glucose level more than any other type of food. Eating carbs raises the amount of glucose in your blood. It is important to know how many carbs you can safely have in each meal. This is different for every person. Your dietitian can help  you calculate how many carbs you should have at each meal and for each snack. How does alcohol affect me? Alcohol can cause a decrease in blood glucose (hypoglycemia), especially if you use insulin or take certain diabetes medicines by mouth. Hypoglycemia can be a life-threatening condition. Symptoms of hypoglycemia, such as sleepiness, dizziness, and confusion, are similar to symptoms of having too much alcohol. Do not drink alcohol if: Your health care provider tells you not to drink. You are pregnant, may be pregnant, or are planning to become pregnant. If you drink alcohol: Limit how much you have to: 0-1 drink a day for women. 0-2 drinks a day for men. Know how much alcohol is in your drink. In the [place] one drink equals one 12 oz bottle of beer (355 mL), one 5 oz glass of wine (148 mL), or one 1 oz glass of hard liquor (44 mL). Keep yourself hydrated with water, diet soda, or unsweetened iced tea. Keep in mind that regular soda, juice, and other mixers may contain a lot of sugar and must be counted as carbs. What are tips for following this plan?  Reading food labels Start by checking the serving size on the Nutrition Facts label of  packaged foods and drinks. The number of calories and the amount of carbs, fats, and other nutrients listed on the label are based on one serving of the item. Many items contain more than one serving per package. Check the total grams (g) of carbs in one serving. Check the number of grams of saturated fats and trans fats in one serving. Choose foods that have a low amount or none of these fats. Check the number of milligrams (mg) of salt (sodium) in one serving. Most people should limit total sodium intake to less than 2,300 mg per day. Always check the nutrition information of foods labeled as "low-fat" or "nonfat." These foods may be higher in added sugar or refined carbs and should be avoided. Talk to your dietitian to identify your daily goals for nutrients listed on the label. Shopping Avoid buying canned, pre-made, or processed foods. These foods tend to be high in fat, sodium, and added sugar. Shop around the outside edge of the grocery store. This is where you will most often find fresh fruits and vegetables, bulk grains, fresh meats, and fresh dairy products. Cooking Use low-heat cooking methods, such as baking, instead of high-heat cooking methods, such as deep frying. Cook using healthy oils, such as olive, canola, or sunflower oil. Avoid cooking with butter, cream, or high-fat meats. Meal planning Eat meals and snacks regularly, preferably at the same times every day. Avoid going long periods of time without eating. Eat foods that are high in fiber, such as fresh fruits, vegetables, beans, and whole grains. Eat 4-6 oz (112-168 g) of lean protein each day, such as lean meat, chicken, fish, eggs, or tofu. One ounce (oz) (28 g) of lean protein is equal to: 1 oz (28 g) of meat, chicken, or fish. 1 egg.  cup (62 g) of tofu. Eat some foods each day that contain healthy fats, such as avocado, nuts, seeds, and fish. What foods should I eat? Fruits Berries. Apples. Oranges. Peaches.  Apricots. Plums. Grapes. Mangoes. Papayas. Pomegranates. Kiwi. Cherries. Vegetables Leafy greens, including lettuce, spinach, kale, chard, collard greens, mustard greens, and cabbage. Beets. Cauliflower. Broccoli. Carrots. Green beans. Tomatoes. Peppers. Onions. Cucumbers. Brussels sprouts. Grains Whole grains, such as whole-wheat or whole-grain bread, crackers, tortillas, cereal, and pasta. Unsweetened oatmeal. Quinoa.  Brown or wild rice. Meats and other proteins Seafood. Poultry without skin. Lean cuts of poultry and beef. Tofu. Nuts. Seeds. Dairy Low-fat or fat-free dairy products such as milk, yogurt, and cheese. The items listed above may not be a complete list of foods and beverages you can eat and drink. Contact a dietitian for more information. What foods should I avoid? Fruits Fruits canned with syrup. Vegetables Canned vegetables. Frozen vegetables with butter or cream sauce. Grains Refined white flour and flour products such as bread, pasta, snack foods, and cereals. Avoid all processed foods. Meats and other proteins Fatty cuts of meat. Poultry with skin. Breaded or fried meats. Processed meat. Avoid saturated fats. Dairy Full-fat yogurt, cheese, or milk. Beverages Sweetened drinks, such as soda or iced tea. The items listed above may not be a complete list of foods and beverages you should avoid. Contact a dietitian for more information. Questions to ask a health care provider Do I need to meet with a certified diabetes care and education specialist? Do I need to meet with a dietitian? What number can I call if I have questions? When are the best times to check my blood glucose? Where to find more information: American Diabetes Association: diabetes.org Academy of Nutrition and Dietetics: eatright.[employer] of Diabetes and Digestive and Kidney Diseases: [email or web address].si Association of Diabetes Care & Education Specialists: diabeteseducator.org Summary It is  important to have healthy eating habits because your blood sugar (glucose) levels are greatly affected by what you eat and drink. It is important to use alcohol carefully. A healthy meal plan will help you manage your blood glucose and lower your risk of heart disease. Your health care provider may recommend that you work with a dietitian to make a meal plan that is best for you. This information is not intended to replace advice given to you by your health care provider. Make sure you discuss any questions you have with your health care provider. Document Revised: 09/17/2019 Document Reviewed: 09/17/2019 Elsevier Patient Education  2024 [employer].

## 2022-09-11 DIAGNOSIS — E1065 Type 1 diabetes mellitus with hyperglycemia: Secondary | ICD-10-CM | POA: Diagnosis not present

## 2022-09-25 ENCOUNTER — Ambulatory Visit (INDEPENDENT_AMBULATORY_CARE_PROVIDER_SITE_OTHER): Payer: Medicare PPO

## 2022-09-25 ENCOUNTER — Other Ambulatory Visit (INDEPENDENT_AMBULATORY_CARE_PROVIDER_SITE_OTHER): Payer: Medicare PPO

## 2022-09-25 ENCOUNTER — Other Ambulatory Visit: Payer: Self-pay | Admitting: Endocrinology

## 2022-09-25 DIAGNOSIS — E1365 Other specified diabetes mellitus with hyperglycemia: Secondary | ICD-10-CM

## 2022-09-25 DIAGNOSIS — E1065 Type 1 diabetes mellitus with hyperglycemia: Secondary | ICD-10-CM

## 2022-09-25 LAB — BASIC METABOLIC PANEL
BUN: 28 mg/dL — ABNORMAL HIGH (ref 6–23)
CO2: 25 mEq/L (ref 19–32)
Calcium: 8.6 mg/dL (ref 8.4–10.5)
Chloride: 107 mEq/L (ref 96–112)
Creatinine, Ser: 1.67 mg/dL — ABNORMAL HIGH (ref 0.40–1.50)
GFR: 39.99 mL/min — ABNORMAL LOW (ref >60.00)
Glucose, Bld: 115 mg/dL — ABNORMAL HIGH (ref 70–99)
Potassium: 3.9 mEq/L (ref 3.5–5.1)
Sodium: 140 mEq/L (ref 135–145)

## 2022-09-25 LAB — T4, FREE: Free T4: 0.69 ng/dL (ref 0.60–1.60)

## 2022-09-29 ENCOUNTER — Ambulatory Visit: Payer: Medicare PPO | Admitting: Endocrinology

## 2022-09-29 ENCOUNTER — Encounter: Payer: Self-pay | Admitting: Endocrinology

## 2022-09-29 VITALS — BP 164/70 | HR 70 | Ht 67.0 in | Wt 317.2 lb

## 2022-09-29 DIAGNOSIS — E1065 Type 1 diabetes mellitus with hyperglycemia: Secondary | ICD-10-CM

## 2022-09-29 DIAGNOSIS — I10 Essential (primary) hypertension: Secondary | ICD-10-CM | POA: Diagnosis not present

## 2022-09-29 DIAGNOSIS — M67912 Unspecified disorder of synovium and tendon, left shoulder: Secondary | ICD-10-CM

## 2022-09-29 DIAGNOSIS — E291 Testicular hypofunction: Secondary | ICD-10-CM | POA: Diagnosis not present

## 2022-09-29 DIAGNOSIS — M6530 Trigger finger, unspecified finger: Secondary | ICD-10-CM

## 2022-09-29 NOTE — Progress Notes (Signed)
Patient ID: Paul Adkins, male   DOB: 09/01/1945, 77 y.o.   MRN: [id number]   Reason for visit:   follow-up of diabetes and new problems  Diagnosis: Type 1 diabetes, date of onset 1978  PAST history: He has had persistently poorly controlled diabetes for several years. A1c has been high with usual range 8.2 -9, overall improved since 2013. Compliance with glucose monitoring and diet has been variable and he does better when he is checking more sugars. His A1c was better than usual in 3/14 at 7.5 but he was also having significant hypoglycemia overnight and also occasionally later in the day. At that time he was started on Invokana 300 mg daily which helped him with glucose control and mild weight loss  CURRENT insulin pump brand: T-slim  PUMP SETTINGS are:   Basal rates: 2.8 from midnight- 4 AM. 4 AM = 2.75.  8 AM = 2.9.  8 PM = 3.2 Boluses : 1 unit for 4 g carbs at mealtimes  Glucose target 110-120, sensitivity 1: 20, with active insulin 5 hours.  Changing tubing every 2 days Recent insulin requirement average 131 units/day   RECENT history:   His A1c is previously better at 7.5 compared to 7.7 Fructosamine 219  Average from recent CGM 155  Current glycemic patterns, management and problems:  Glycemic patterns were reviewed from his T-Slim pump download  Interpretation of the Dexcom data from the pump download for the last 2 weeks as follows: Time in range = 78% Average CGM 155 with 1% readings below normal On an average blood sugars are the highest around 8-9 PM and lowest around 5-6 PM Overall highest blood sugars are in the evening segment with some variability Overnight blood sugars are higher starting at midnight but progressively improving into the morning Overnight hypoglycemia have occurred occasionally around 3-4 AM Blood sugars between breakfast and dinnertime are overall very well-controlled with generally fairly stable readings He has periodically  significantly high readings after evening meal or late evening with only a couple of good levels of control Hypoglycemia not present during the day    Insulin pump management: He has mostly good blood sugar patterns during the day but higher in the evenings and late in the evening also Currently not using the sleep more He says that his higher readings in the evenings are related to more snacks and likely more carbohydrates and more frequent bolusing which may not be adequate at times Because of his insomnia he is sometimes eating snacks at night However as before is not able to exercise and gaining weight Despite this is time in range is only slightly worse at 75%   CGM 2-week data:    PRE-MEAL  overnight  mornings  afternoon  evening Overall  Glucose range:       Averages: 153 146  149 183    Previously:   CGM use % of time   2-week average/SD 154  Time in range    79    %  % Time Above 180 21  % Time above 250   % Time Below 70 0      PRE-MEAL  overnight  mornings  afternoon  evening Overall  Glucose range:       Averages: 146 149  172 151      Wt Readings from Last 3 Encounters:  09/29/22 (!) 317 lb 3.2 oz (143.9 kg)  09/06/22 (!) 305 lb (138.3 kg)  07/12/22 (!) 305 lb 12.8 oz (  138.7 kg)    Lab Results  Component Value Date   HGBA1C 7.5 (H) 07/10/2022   HGBA1C 7.7 (H) 04/07/2022   HGBA1C 7.3 (H) 12/20/2021   Lab Results  Component Value Date   MICROALBUR <0.7 09/12/2021   LDLCALC 39 12/20/2021   CREATININE 1.67 (H) 09/25/2022    OTHER problems addressed today are in review of systems    Appointment on 09/25/2022  Component Date Value Ref Range Status   Fructosamine 09/25/2022 219  0 - 285 umol/L Final   Comment: Published reference interval for apparently healthy subjects between age 69 and 67 is 38 - 285 umol/L and in a poorly controlled diabetic population is 228 - 563 umol/L with a mean of 396 umol/L.    Sodium 09/25/2022 140  135 - 145  mEq/L Final   Potassium 09/25/2022 3.9  3.5 - 5.1 mEq/L Final   Chloride 09/25/2022 107  96 - 112 mEq/L Final   CO2 09/25/2022 25  19 - 32 mEq/L Final   Glucose, Bld 09/25/2022 115 (H)  70 - 99 mg/dL Final   BUN 16/11/9602 28 (H)  6 - 23 mg/dL Final   Creatinine, Ser 09/25/2022 1.67 (H)  0.40 - 1.50 mg/dL Final   GFR 54/10/8117 39.99 (L)  >60.00 mL/min Final   Calculated using the CKD-EPI Creatinine Equation (2021)   Calcium 09/25/2022 8.6  8.4 - 10.5 mg/dL Final  Lab on 14/78/2956  Component Date Value Ref Range Status   Free T4 09/25/2022 0.69  0.60 - 1.60 ng/dL Final   Comment: Specimens from patients who are undergoing biotin therapy and /or ingesting biotin supplements may contain high levels of biotin.  The higher biotin concentration in these specimens interferes with this Free T4 assay.  Specimens that contain high levels  of biotin may cause false high results for this Free T4 assay.  Please interpret results in light of the total clinical presentation of the patient.      Allergies as of 09/29/2022   No Known Allergies      Medication List        Accurate as of September 29, 2022 11:59 PM. If you have any questions, ask your nurse or doctor.          atorvastatin 20 MG tablet Commonly known as: LIPITOR TAKE 1 TABLET BY MOUTH EVERY DAY   carvedilol 6.25 MG tablet Commonly known as: COREG Take 6.25 mg by mouth 2 (two) times daily with a meal.   Comirnaty Susp injection Generic drug: COVID-19 mRNA Vac-TriS (Pfizer) Inject into the muscle.   furosemide 40 MG tablet Commonly known as: LASIX Take 2 per day   glucose blood test strip Commonly known as: Banker Next Test Use as instructed to check blood sugars 4 times per day dx code E10.65   insulin aspart 100 UNIT/ML injection Commonly known as: NovoLOG USE MAX OF 170 UNITS UNDER THE SKIN DAILY VIA INSULIN PUMP. E11.65   Jatenzo 237 MG Caps Generic drug: Testosterone Undecanoate 1 CAPSULE WITH BREAKFAST  AND 1 WITH DINNER   methylphenidate 10 MG tablet Commonly known as: RITALIN Take 1 tablet up to twice a day   MIRALAX PO Take by mouth every other day.   olmesartan 40 MG tablet Commonly known as: BENICAR Take 40 mg by mouth daily.   PARoxetine 40 MG tablet Commonly known as: PAXIL TAKE 1 TABLET BY MOUTH EVERYDAY AT BEDTIME   spironolactone 50 MG tablet Commonly known as: ALDACTONE Take 50 mg by  mouth daily.   VITAMIN D (CHOLECALCIFEROL) PO Take 6,000 Int'l Units/day by mouth.        Allergies:  No Known Allergies   Past Medical History:  Diagnosis Date   Carpal tunnel syndrome    Chronic kidney disease    Depression    Diabetes mellitus without complication (HCC)    ED (erectile dysfunction)    Edema    Hyperlipidemia    Hypertension    Lumbar disc disease    Neuropathy    Sleep apnea    Stroke [facility])     Past Surgical History:  Procedure Laterality Date   BACK SURGERY     VASECTOMY      Family History  Problem Relation Age of Onset   Osteoarthritis Mother    Cancer Father        Prostate   Cancer Brother    Drug abuse Brother    Diabetes Neg Hx     Social History:  reports that he has never smoked. He has never been exposed to tobacco smoke. He has never used smokeless tobacco. He reports that he does not drink alcohol and does not use drugs.  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   Had been on AndroGel 1.62% pump  He is taking Jatenzo 237 mg twice a day  Last testosterone level was normal However still having fatigue but also having some sleep difficulties  Testosterone level as follows:   Lab Results  Component Value Date   TESTOSTERONE 368.34 07/10/2022    HYPERTENSION: treated with 40 mg Benicar and carvedilol unknown dose  He reports his blood pressure has been as much is 180 systolic Continues 40 mg on Benicar dose and taking spironolactone also  He has been to the nephrologist regularly for blood pressure management  He is taking Lasix 80 mg daily as prescribed by his nephrologist; however he says that his legs are much more swollen now Also Aldactone 50 mg  BP Readings from Last 3 Encounters:  09/29/22 (!) 164/70  07/12/22 (!) 150/70  05/05/22 (!) 144/68     His creatinine is variable  No microalbuminuria  Lab Results  Component Value Date   CREATININE 1.67 (H) 09/25/2022   CREATININE 1.89 (H) 04/07/2022   CREATININE 1.66 (H) 12/20/2021     Hypercholesterolemia: Has been controlled on Lipitor 20 mg, has low HDL   Lab Results  Component Value Date   CHOL 98 12/20/2021   HDL 32.20 (L) 12/20/2021   LDLCALC 39 12/20/2021   LDLDIRECT 67.0 08/02/2015   TRIG 134.0 12/20/2021   CHOLHDL 3 12/20/2021    Has history of retinopathy, has regular follow-up with specialist Dr  Ashley Royalty  No history of thyroid disease  Lab Results  Component Value Date   TSH 3.63 07/12/2022   TSH 4.10 06/07/2020   TSH 3.29 01/28/2019   FREET4 0.69 09/25/2022   FREET4 0.87 07/12/2022  FREET4 0.73 03/16/2017   He is asking about his fingers especially in the right side locking up and will open with some discomfort  Although he says he is hurting all over his pain is more so on the left shoulder and upper arm with difficulty raising his arm Some chronic back pain present but recent MRI of the back does not show any significantly new  findings  EXAM:   BP (!) 164/70   Pulse 70   Ht 5\' 7"  (1.702 m)   Wt (!) 317 lb 3.2 oz (143.9 kg)   SpO2 97%   BMI 49.68 kg/m     Diabetic Foot Exam - Simple   Simple Foot Form Diabetic Foot exam was performed with the following findings: Yes   Visual Inspection No deformities, no ulcerations, no other skin breakdown bilaterally: Yes Sensation Testing See comments: Yes Pulse Check Posterior Tibialis and Dorsalis pulse intact bilaterally: Yes See comments: Yes Comments Significant decreased monofilament sensation on the distal toes and less on the plantar surfaces.  Posterior tibialis pulse not felt because of  edema     ASSESSMENT/PLAN:  DIABETES Type I and obesity:  See history of present illness for detailed discussion on current blood sugar patterns from data obtained from download of devices and problems identified  His A1c is 7.5 most recently  He has been using the T-slim pump using control IQ  Currently on NovoLog insulin but usually requiring large doses, recent total 131 units  His time in range is reasonably good at 75% However main difficulty is postprandial hyperglycemia in the evenings related to larger portions, increased carbohydrate intake and difficulty covering his food intake and snacks Not able to exercise Basal rates do not appear to be needing a change with no consistent pattern in between meals  HYPOGONADISM: Testosterone level on  Jatenzo 238 mg daily to be rechecked on the next visit  WUJ:[id number] by nephrology but needs follow-up  HYPERTENSION: Blood pressure is managed by nephrologist and also requiring significant amounts of diuretics.  Recently with more edema he needs to increase his Lasix by at least 40 mg daily and follow-up with his nephrologist for blood pressure control  Trigger finger and left rotator cuff like symptoms: Referred him to hand surgeon  Trial of melatonin 3 mg at sundown for his insomnia, follow-up with  PCP   There are no Patient Instructions on file for this visit.  Total visit time for evaluation and management of multiple problems as above and counseling = 40 minutes  Reather Littler 10/02/2022

## 2022-10-02 ENCOUNTER — Encounter: Payer: Self-pay | Admitting: Endocrinology

## 2022-10-03 DIAGNOSIS — M5136 Other intervertebral disc degeneration, lumbar region: Secondary | ICD-10-CM | POA: Diagnosis not present

## 2022-10-03 DIAGNOSIS — M4316 Spondylolisthesis, lumbar region: Secondary | ICD-10-CM | POA: Diagnosis not present

## 2022-10-03 DIAGNOSIS — Z6841 Body Mass Index (BMI) 40.0 and over, adult: Secondary | ICD-10-CM | POA: Diagnosis not present

## 2022-10-03 DIAGNOSIS — M48062 Spinal stenosis, lumbar region with neurogenic claudication: Secondary | ICD-10-CM | POA: Diagnosis not present

## 2022-10-03 NOTE — Progress Notes (Signed)
This encounter was created in error - please disregard.

## 2022-10-09 DIAGNOSIS — M65312 Trigger thumb, left thumb: Secondary | ICD-10-CM | POA: Diagnosis not present

## 2022-10-09 DIAGNOSIS — M6530 Trigger finger, unspecified finger: Secondary | ICD-10-CM | POA: Diagnosis not present

## 2022-10-11 DIAGNOSIS — E1065 Type 1 diabetes mellitus with hyperglycemia: Secondary | ICD-10-CM | POA: Diagnosis not present

## 2022-10-23 DIAGNOSIS — M65331 Trigger finger, right middle finger: Secondary | ICD-10-CM | POA: Diagnosis not present

## 2022-10-23 DIAGNOSIS — M65312 Trigger thumb, left thumb: Secondary | ICD-10-CM | POA: Diagnosis not present

## 2022-10-26 ENCOUNTER — Other Ambulatory Visit: Payer: Self-pay | Admitting: Endocrinology

## 2022-10-31 ENCOUNTER — Other Ambulatory Visit: Payer: Self-pay | Admitting: Endocrinology

## 2022-11-01 ENCOUNTER — Telehealth: Payer: Self-pay

## 2022-11-01 NOTE — Telephone Encounter (Signed)
Contact patient to schedule with new provider last seen 8/24

## 2022-11-15 ENCOUNTER — Encounter: Payer: Self-pay | Admitting: Endocrinology

## 2022-11-16 ENCOUNTER — Other Ambulatory Visit: Payer: Self-pay | Admitting: Endocrinology

## 2022-11-16 ENCOUNTER — Other Ambulatory Visit: Payer: Self-pay

## 2022-11-16 DIAGNOSIS — F32A Depression, unspecified: Secondary | ICD-10-CM

## 2022-11-16 MED ORDER — PAROXETINE HCL 40 MG PO TABS: 40.0000 mg | ORAL_TABLET | Freq: Every day | ORAL | 0 refills | Status: DC

## 2022-11-16 NOTE — Telephone Encounter (Signed)
Paroxetine if he has been following with psychiatrist may need to ask with psychiatrist for refill, I see on the Dr. Remus Blake note that he has been following with psychiatrist as well.  In regard to pump supply, please call and talk with the patient what he is referring that you needed to do to get that supplies.  Generally pump supplies has supplier and they generally send the form that we need to complete please check with the patient what we need to do.  Paul Thinh Cuccaro, MD [facility] Endocr[facility] [address] [place], [address] [place], [place] [address] Phone # [phone]

## 2022-11-17 DIAGNOSIS — E1065 Type 1 diabetes mellitus with hyperglycemia: Secondary | ICD-10-CM | POA: Diagnosis not present

## 2022-11-24 DIAGNOSIS — E1065 Type 1 diabetes mellitus with hyperglycemia: Secondary | ICD-10-CM | POA: Diagnosis not present

## 2022-11-28 DIAGNOSIS — M65331 Trigger finger, right middle finger: Secondary | ICD-10-CM | POA: Diagnosis not present

## 2022-12-08 DIAGNOSIS — E1065 Type 1 diabetes mellitus with hyperglycemia: Secondary | ICD-10-CM | POA: Diagnosis not present

## 2022-12-15 ENCOUNTER — Telehealth: Payer: Self-pay

## 2022-12-15 MED ORDER — ATORVASTATIN CALCIUM 20 MG PO TABS: 20.0000 mg | ORAL_TABLET | Freq: Every day | ORAL | 1 refills | Status: DC

## 2022-12-15 NOTE — Telephone Encounter (Signed)
Patient requesting Atorvastatin refill, sent to MD for approval

## 2022-12-15 NOTE — Addendum Note (Signed)
Addended by: Cherysh Epperly, [place] on: 12/15/2022 10:44 PM   Modules accepted: Orders

## 2022-12-20 DIAGNOSIS — N1832 Chronic kidney disease, stage 3b: Secondary | ICD-10-CM | POA: Diagnosis not present

## 2022-12-20 DIAGNOSIS — E109 Type 1 diabetes mellitus without complications: Secondary | ICD-10-CM | POA: Diagnosis not present

## 2022-12-20 DIAGNOSIS — I129 Hypertensive chronic kidney disease with stage 1 through stage 4 chronic kidney disease, or unspecified chronic kidney disease: Secondary | ICD-10-CM | POA: Diagnosis not present

## 2022-12-25 ENCOUNTER — Other Ambulatory Visit (INDEPENDENT_AMBULATORY_CARE_PROVIDER_SITE_OTHER): Payer: Medicare PPO

## 2022-12-25 ENCOUNTER — Other Ambulatory Visit: Payer: Self-pay

## 2022-12-25 DIAGNOSIS — E1065 Type 1 diabetes mellitus with hyperglycemia: Secondary | ICD-10-CM

## 2022-12-25 LAB — BASIC METABOLIC PANEL
BUN: 27 mg/dL — ABNORMAL HIGH (ref 6–23)
CO2: 26 meq/L (ref 19–32)
Calcium: 9.2 mg/dL (ref 8.4–10.5)
Chloride: 105 meq/L (ref 96–112)
Creatinine, Ser: 1.65 mg/dL — ABNORMAL HIGH (ref 0.40–1.50)
GFR: 40.5 mL/min — ABNORMAL LOW (ref >60.00)
Glucose, Bld: 147 mg/dL — ABNORMAL HIGH (ref 70–99)
Potassium: 4.5 meq/L (ref 3.5–5.1)
Sodium: 137 meq/L (ref 135–145)

## 2022-12-25 LAB — HEMOGLOBIN A1C: Hgb A1c MFr Bld: 7.5 % — ABNORMAL HIGH (ref 4.6–6.5)

## 2022-12-25 LAB — TESTOSTERONE: Testosterone: 353.95 ng/dL (ref 300.00–890.00)

## 2022-12-26 ENCOUNTER — Ambulatory Visit (INDEPENDENT_AMBULATORY_CARE_PROVIDER_SITE_OTHER): Payer: Medicare PPO | Admitting: Family

## 2022-12-26 ENCOUNTER — Encounter: Payer: Self-pay | Admitting: Family

## 2022-12-26 VITALS — BP 162/64 | HR 77 | Resp 18 | Ht 67.0 in | Wt 312.8 lb

## 2022-12-26 DIAGNOSIS — Z0000 Encounter for general adult medical examination without abnormal findings: Secondary | ICD-10-CM

## 2022-12-26 DIAGNOSIS — Z23 Encounter for immunization: Secondary | ICD-10-CM

## 2022-12-26 MED ORDER — FUROSEMIDE 40 MG PO TABS: ORAL_TABLET | ORAL | 0 refills | Status: DC

## 2022-12-26 MED ORDER — PAROXETINE HCL 40 MG PO TABS: 40.0000 mg | ORAL_TABLET | Freq: Every day | ORAL | 3 refills | Status: DC

## 2022-12-26 NOTE — Progress Notes (Signed)
Paul Adkins is a 77 y.o. male with the following history as recorded in EpicCare:  Patient Active Problem List   Diagnosis Date Noted   Conjunctivitis 09/19/2018   Obstructive sleep apnea 08/26/2013   Type 1 diabetes mellitus with complications (HCC) 09/04/2012   Essential hypertension, benign 09/04/2012   Hypogonadism male 09/04/2012   Depression 09/04/2012   Pure hypercholesterolemia 09/04/2012   Severe obesity (BMI >= 40) (HCC) 09/04/2012    Current Outpatient Medications  Medication Sig Dispense Refill   atorvastatin (LIPITOR) 20 MG tablet Take 1 tablet (20 mg total) by mouth daily. 90 tablet 1   carvedilol (COREG) 6.25 MG tablet Take 6.25 mg by mouth 2 (two) times daily with a meal.     COMIRNATY SUSP injection Inject into the muscle.     glucose blood (BAYER CONTOUR NEXT TEST) test strip Use as instructed to check blood sugars 4 times per day dx code E10.65 150 each 5   JATENZO 237 MG CAPS 1 CAPSULE WITH BREAKFAST AND 1 WITH DINNER 60 capsule 3   methylphenidate (RITALIN) 10 MG tablet Take 1 tablet up to twice a day 60 tablet 0   NOVOLOG 100 UNIT/ML injection USE MAX OF 170 UNITS UNDER THE SKIN DAILY VIA INSULIN PUMP. 60 mL PRN   olmesartan (BENICAR) 40 MG tablet Take 40 mg by mouth daily.     Polyethylene Glycol 3350 (MIRALAX PO) Take by mouth every other day.     spironolactone (ALDACTONE) 50 MG tablet Take 50 mg by mouth daily.     VITAMIN D, CHOLECALCIFEROL, PO Take 6,000 Int'l Units/day by mouth.     furosemide (LASIX) 40 MG tablet Take 1 tablet po bid prn 60 tablet 0   PARoxetine (PAXIL) 40 MG tablet Take 1 tablet (40 mg total) by mouth daily. 90 tablet 3   No current facility-administered medications for this visit.    Allergies: Patient has no known allergies.  Past Medical History:  Diagnosis Date   Carpal tunnel syndrome    Chronic kidney disease    Depression    Diabetes mellitus without complication (HCC)    ED (erectile dysfunction)    Edema     Hyperlipidemia    Hypertension    Lumbar disc disease    Neuropathy    Sleep apnea    Stroke [facility])     Past Surgical History:  Procedure Laterality Date   BACK SURGERY     VASECTOMY      Family History  Problem Relation Age of Onset   Osteoarthritis Mother    Cancer Father        Prostate   Cancer Brother    Drug abuse Brother    Diabetes Neg Hx     Social History   Tobacco Use   Smoking status: Never    Passive exposure: Never   Smokeless tobacco: Never  Substance Use Topics   Alcohol use: No    Subjective:   Presents for yearly CPE; majority of care of managed by endocrinology and nephrology; does see retina specialist every 6 months; had labs done with his endocrinologist yesterday and follow up there is scheduled for next week; sees nephrology every 4 months- Dr. Bufford Buttner;   Continuing to struggle with "feeling swollen"- had been referred to cardiology in March but patient opted against follow up; notes at this time, does not want to pursue the referral;      Objective:  Vitals:   12/26/22 0902  BP: (!) 162/64  Pulse: 77  Resp: 18  SpO2: 98%  Weight: (!) 312 lb 12.8 oz (141.9 kg)  Height: 5\' 7"  (1.702 m)    General: Well developed, well nourished, in no acute distress  Skin : Warm and dry.  Head: Normocephalic and atraumatic  Eyes: Sclera and conjunctiva clear; pupils round and reactive to light; extraocular movements intact  Ears: External normal; canals clear; tympanic membranes normal  Oropharynx: Pink, supple. No suspicious lesions  Neck: Supple without thyromegaly, adenopathy  Lungs: Respirations unlabored; clear to auscultation bilaterally without wheeze, rales, rhonchi  CVS exam: normal rate and regular rhythm.  Musculoskeletal: No deformities; no active joint inflammation  Extremities: pedal edema, cyanosis, clubbing  Vessels: Symmetric bilaterally  Neurologic: Alert and oriented; speech intact; face symmetrical; moves all extremities  well; CNII-XII intact without focal deficit   Assessment:  1. PE (physical exam), annual   2. Need for influenza vaccination     Plan:  Age appropriate preventive healthcare needs addressed; encouraged regular eye doctor and dental exams; will update refills as needed today; flu shot updated; he will continue regular visit with endocrinology and nephrology who are managing majority of his healthcare needs.  Did again recommend cardiology follow up due to chronic swelling- he notes he does not want to pursue at this time; would like to meet with new endocrinologist next week and see what his nephrologist recommends based on notes/ labs from last week; he can reach out if he decides he would like to update the referral;    No follow-ups on file.  Orders Placed This Encounter  Procedures   Flu Vaccine Trivalent High Dose (Fluad)    Requested Prescriptions   Signed Prescriptions Disp Refills   PARoxetine (PAXIL) 40 MG tablet 90 tablet 3    Sig: Take 1 tablet (40 mg total) by mouth daily.   furosemide (LASIX) 40 MG tablet 60 tablet 0    Sig: Take 1 tablet po bid prn

## 2022-12-26 NOTE — Patient Instructions (Signed)
Please discuss management of Lasix with your nephrologist when you see Dr. Signe Colt for follow up; it would be best for your health if she is managing this prescription for you.

## 2022-12-28 DIAGNOSIS — M65331 Trigger finger, right middle finger: Secondary | ICD-10-CM | POA: Diagnosis not present

## 2023-01-03 ENCOUNTER — Ambulatory Visit: Payer: Medicare PPO | Admitting: Endocrinology

## 2023-01-03 ENCOUNTER — Encounter: Payer: Self-pay | Admitting: Endocrinology

## 2023-01-03 VITALS — BP 134/60 | HR 70 | Resp 24 | Ht 67.0 in | Wt 309.2 lb

## 2023-01-03 DIAGNOSIS — E10319 Type 1 diabetes mellitus with unspecified diabetic retinopathy without macular edema: Secondary | ICD-10-CM | POA: Diagnosis not present

## 2023-01-03 DIAGNOSIS — E1065 Type 1 diabetes mellitus with hyperglycemia: Secondary | ICD-10-CM

## 2023-01-03 DIAGNOSIS — E291 Testicular hypofunction: Secondary | ICD-10-CM | POA: Diagnosis not present

## 2023-01-03 NOTE — Progress Notes (Signed)
Outpatient Endocrinology Note Paul Caryle Helgeson, MD  01/03/23  Patient's Name: Paul Adkins    DOB: Jul 23, 1945    MRN: [id number]                                                    REASON OF VISIT: Follow up of type 1 diabetes mellitus  PCP: Paul Bass, FNP  HISTORY OF PRESENT ILLNESS:   Paul Adkins is a 77 y.o. old male with past medical history listed below, is here for follow up of type 1 diabetes mellitus.  Patient was last seen by Paul Adkins in August 2024.  Pertinent Diabetes History: Patient was diagnosed with type 1 diabetes mellitus in 1978.  He has been on insulin pump therapy.  Chronic Diabetes Complications : Retinopathy: yes. Last ophthalmology exam was done on annually, following with ophthalmology regularly, Paul Adkins.   Nephropathy: CKD IIIb, on ACE/ARB / olmesartan, following with nephrology. Peripheral neuropathy: no Coronary artery disease: on Stroke: yes  Relevant comorbidities and cardiovascular risk factors: Obesity: yes Body mass index is 48.43 kg/m.  Hypertension: Yes  Hyperlipidemia : Yes, on statin.   Current / Home Diabetic regimen includes:  Tandem t:slim insulin pump on control IQ with Dexcom G6.  Using NovoLog U100.  Insulin Pump setting:  Basal ( total 69.80) MN- 2.80u/hour 4AM- 2.750  8AM- 2.90 10AM- 2.90 8PM-   3.20  Bolus CHO Ratio (1unit:CHO) MN- 1:4  Correction/Sensitivity: MN- 1:20  Target: 110  Active insulin time: 5 hours  Prior diabetic medications: He had tried in invokana in the past. Ozempic in the past, later stopped not sure about side effect, patient does not want to go back on it.    CONTINUOUS GLUCOSE MONITORING SYSTEM (CGMS) / INSULIN PUMP INTERPRETATION:                         Tandem Pump & Sensor Download (Reviewed and summarized below.) Pump: Dexcom G6 and Tandem t:slim Dates: October 24 through January 03, 2023     Trends:  Mostly acceptable blood sugar.  Some of the days intermittently  hyperglycemia postprandially related with meals especially with supper around 6 PM with blood sugar up to 200s to 250 range related with high carb meal, inadequate meal bolus and sometimes late meal bolus.  Some of the hyperglycemia seems to be related with site issue.  Blood sugar overnight and in between the meals are acceptable.  No hypoglycemia.  GMI 7.1%.  Time in range 75%.  Hypoglycemia: Patient has no hypoglycemic episodes. Patient has hypoglycemia awareness.    Factors modifying glucose control: 1.  Diabetic diet assessment: 3 meals a day.  2.  Staying active or exercising: No formal exercise.  3.  Medication compliance: compliant most of the time.  # Hypogonadism : - He has had hypogonadotropic hypogonadism, he has been on testosterone supplementation since about 2007. Had been on AndroGel 1.62% pump.  Currently on Jatenzo 237 mg twice a day  Interval history 01/03/23 CGM and pump data as reviewed above.  Overall fair control with hyperglycemia related with meals especially with supper.  He reports normal energy.  He has been on [employer].  Recent lab results reviewed.  Renal function is stable.  Hemoglobin A1c 7.5%.  Testosterone level acceptable at 353.  REVIEW OF SYSTEMS As per  history of present illness.   PAST MEDICAL HISTORY: Past Medical History:  Diagnosis Date   Carpal tunnel syndrome    Chronic kidney disease    Depression    Diabetes mellitus without complication (HCC)    ED (erectile dysfunction)    Edema    Hyperlipidemia    Hypertension    Lumbar disc disease    Neuropathy    Sleep apnea    Stroke (HCC)     PAST SURGICAL HISTORY: Past Surgical History:  Procedure Laterality Date   BACK SURGERY     VASECTOMY      ALLERGIES: No Known Allergies  FAMILY HISTORY:  Family History  Problem Relation Age of Onset   Osteoarthritis Mother    Cancer Father        Prostate   Cancer Brother    Drug abuse Brother    Diabetes Neg Hx     SOCIAL  HISTORY: Social History   Socioeconomic History   Marital status: Single    Spouse name: Not on file   Number of children: Not on file   Years of education: Not on file   Highest education level: Not on file  Occupational History   Occupation: retired    Comment: Doctor, 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, Music  Tobacco Use   Smoking status: Never    Passive exposure: Never   Smokeless tobacco: Never  Vaping Use   Vaping status: Never Used  Substance and Sexual Activity   Alcohol use: No   Drug use: No   Sexual activity: Not on file  Other Topics Concern   Not on file  Social History Narrative   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 [employer] Strain: Low Risk  (09/05/2022)   Overall Financial Resource Strain (CARDIA)    Difficulty of Paying Living Expenses: Not hard at all  Food Insecurity: Low Risk  (12/28/2022)   Received from [facility]   Hunger Vital Sign    Worried About Running Out of Food in the Last Year: Never true    Ran Out of Food in the Last Year: Never true  Transportation Needs: No Transportation Needs (12/28/2022)   Received from [facility]    In the past 12 months, has lack of reliable transportation kept you from medical appointments, meetings, work or from getting things needed for daily living? : No  Physical Activity: Inactive (09/05/2022)   Exercise Vital Sign    Days of Exercise per Week: 0 days    Minutes of Exercise per Session: 0 min  Stress: No Stress Concern Present (09/05/2022)   [employer] of Occupational Health - Occupational Stress Questionnaire    Feeling of Stress : Only a little  Social Connections: Unknown (09/05/2022)   Social Connection and Isolation Panel [NHANES]    Frequency of Communication with Friends and Family: Twice a week    Frequency of Social Gatherings with Friends and Family: Once a week    Attends Religious Services: Never    Database administrator  or Organizations: Yes    Attends Engineer, structural: More than 4 times per year    Marital Status: Patient declined    MEDICATIONS:  Current Outpatient Medications  Medication Sig Dispense Refill   atorvastatin (LIPITOR) 20 MG tablet Take 1 tablet (20 mg total) by mouth daily. 90 tablet 1   carvedilol (COREG) 6.25 MG tablet Take 6.25 mg by mouth 2 (  two) times daily with a meal.     COMIRNATY SUSP injection Inject into the muscle.     furosemide (LASIX) 40 MG tablet Take 1 tablet po bid prn 60 tablet 0   glucose blood (BAYER CONTOUR NEXT TEST) test strip Use as instructed to check blood sugars 4 times per day dx code E10.65 150 each 5   Insulin Infusion Pump (T:SLIM X2 CONTROL-IQ 7.8 PUMP) DEVI by Does not apply route.     JATENZO 237 MG CAPS 1 CAPSULE WITH BREAKFAST AND 1 WITH DINNER 60 capsule 3   methylphenidate (RITALIN) 10 MG tablet Take 1 tablet up to twice a day 60 tablet 0   NOVOLOG 100 UNIT/ML injection USE MAX OF 170 UNITS UNDER THE SKIN DAILY VIA INSULIN PUMP. 60 mL PRN   olmesartan (BENICAR) 40 MG tablet Take 40 mg by mouth daily.     PARoxetine (PAXIL) 40 MG tablet Take 1 tablet (40 mg total) by mouth daily. 90 tablet 3   Polyethylene Glycol 3350 (MIRALAX PO) Take by mouth every other day.     spironolactone (ALDACTONE) 50 MG tablet Take 50 mg by mouth daily.     VITAMIN D, CHOLECALCIFEROL, PO Take 6,000 Int'l Units/day by mouth.     No current facility-administered medications for this visit.    PHYSICAL EXAM: Vitals:   01/03/23 0806  BP: 134/60  Pulse: 70  Resp: (!) 24  SpO2: 97%  Weight: (!) 309 lb 3.2 oz (140.3 kg)  Height: 5\' 7"  (1.702 m)   Body mass index is 48.43 kg/m.  Wt Readings from Last 3 Encounters:  01/03/23 (!) 309 lb 3.2 oz (140.3 kg)  12/26/22 (!) 312 lb 12.8 oz (141.9 kg)  09/29/22 (!) 317 lb 3.2 oz (143.9 kg)    General: Well developed, well nourished male in no apparent distress.  HEENT: AT/[place], no external lesions.  Eyes:  Conjunctiva clear and no icterus. Neck: Neck supple  Lungs: Respirations not labored Neurologic: Alert, oriented, normal speech Extremities / Skin: Dry.  Psychiatric: Does not appear depressed or anxious  Diabetic Foot Exam - Simple   No data filed     LABS Reviewed Lab Results  Component Value Date   HGBA1C 7.5 (H) 12/25/2022   HGBA1C 7.5 (H) 07/10/2022   HGBA1C 7.7 (H) 04/07/2022   Lab Results  Component Value Date   FRUCTOSAMINE 219 09/25/2022   FRUCTOSAMINE 256 08/02/2015   Lab Results  Component Value Date   CHOL 98 12/20/2021   HDL 32.20 (L) 12/20/2021   LDLCALC 39 12/20/2021   LDLDIRECT 67.0 08/02/2015   TRIG 134.0 12/20/2021   CHOLHDL 3 12/20/2021   Lab Results  Component Value Date   MICRALBCREAT 5 08/21/2022   MICRALBCREAT 5 08/18/2022   Lab Results  Component Value Date   CREATININE 1.65 (H) 12/25/2022   Lab Results  Component Value Date   GFR 40.50 (L) 12/25/2022    ASSESSMENT / PLAN  1. Uncontrolled type 1 diabetes mellitus with hyperglycemia (HCC)   2. Hypogonadism male   3. Diabetic retinopathy associated with type 1 diabetes mellitus, macular edema presence unspecified, unspecified laterality, unspecified retinopathy severity (HCC)     Diabetes Mellitus type 1, complicated by diabetic retinopathy/CKD. - Diabetic status / severity: Uncontrolled.  Lab Results  Component Value Date   HGBA1C 7.5 (H) 12/25/2022   Patient has high insulin resistance to obesity.  - Hemoglobin A1c goal <7%   Patient not interested in GLP-1 receptor agonist type medication, to help  to lose weight and increase insulin sensitivity.  He has been requiring large dose of insulin about 125 units/day.  Change correction factor for evening meal as follows.  - Medications:  Insulin pump setting changed as follows:  Tandem t:slim insulin pump on control IQ with Dexcom G6.  Using NovoLog U100.  Insulin Pump setting:  Basal ( total  69.80) MN- 2.80u/hour 4AM- 2.750  8AM- 2.90 10AM- 2.90 8PM-    3.20  Bolus CHO Ratio (1unit:CHO) MN- 1:4  Correction/Sensitivity: MN- 1:20  Target: 110  Active insulin time: 5 hours   Tandem t:slim insulin pump on control IQ with Dexcom G6.  Using NovoLog U100.  Changed to as follows insulin Pump setting:  Basal ( total 69.80) MN- 2.80u/hour 4AM- 2.750  8AM- 2.90 10AM- 2.90 6PM-   3.20  Bolus CHO Ratio (1unit:CHO) MN- 1:4  Correction/Sensitivity: MN- 1:20 6PM           1:14  Target: 110  Active insulin time: 5 hours  - Home glucose testing: continue CGM and check blood glucose as needed.  - Discussed/ Gave Hypoglycemia treatment plan.  # Consult : not required at this time.   # Annual urine for microalbuminuria/ creatinine ratio, no microalbuminuria currently, continue ACE/ARB /olmesartan / he has CKD, following with nephrology. Last  Lab Results  Component Value Date   MICRALBCREAT 5 08/21/2022    # Foot check nightly.  # Annual dilated diabetic eye exams.   - Diet: Make healthy diabetic food choices - Life style / activity / exercise: Discussed.  Not able to exercise.  2. Blood pressure  -  BP Readings from Last 1 Encounters:  01/03/23 134/60    - Control is in target.  - No change in current plans.  3. Lipid status / Hyperlipidemia - Last  Lab Results  Component Value Date   LDLCALC 39 12/20/2021   - Continue atorvastatin 20 mg daily.  Will check lipid panel with next set of lab.  # Hypogonadism: -Continue Jatenzo 237 mg 2 times a day with meals.  Diagnoses and all orders for this visit:  Uncontrolled type 1 diabetes mellitus with hyperglycemia (HCC) -     Hemoglobin A1c; Future -     Lipid panel; Future  Hypogonadism male  Diabetic retinopathy associated with type 1 diabetes mellitus, macular edema presence unspecified, unspecified laterality, unspecified retinopathy severity (HCC)    DISPOSITION Follow up in clinic in 3  months suggested.   All questions answered and patient verbalized understanding of the plan.  Paul Wilborn Membreno, MD [facility] [address] [place], [address] [place], [place] [address] Phone # [phone]  At least part of this note was generated using voice recognition software. Inadvertent word errors may have occurred, which were not recognized during the proofreading process.

## 2023-01-09 DIAGNOSIS — M4316 Spondylolisthesis, lumbar region: Secondary | ICD-10-CM | POA: Diagnosis not present

## 2023-01-24 DIAGNOSIS — D225 Melanocytic nevi of trunk: Secondary | ICD-10-CM | POA: Diagnosis not present

## 2023-01-24 DIAGNOSIS — L918 Other hypertrophic disorders of the skin: Secondary | ICD-10-CM | POA: Diagnosis not present

## 2023-01-24 DIAGNOSIS — Z85828 Personal history of other malignant neoplasm of skin: Secondary | ICD-10-CM | POA: Diagnosis not present

## 2023-01-24 DIAGNOSIS — I872 Venous insufficiency (chronic) (peripheral): Secondary | ICD-10-CM | POA: Diagnosis not present

## 2023-01-24 DIAGNOSIS — I8312 Varicose veins of left lower extremity with inflammation: Secondary | ICD-10-CM | POA: Diagnosis not present

## 2023-01-24 DIAGNOSIS — E1065 Type 1 diabetes mellitus with hyperglycemia: Secondary | ICD-10-CM | POA: Diagnosis not present

## 2023-01-24 DIAGNOSIS — I8311 Varicose veins of right lower extremity with inflammation: Secondary | ICD-10-CM | POA: Diagnosis not present

## 2023-01-24 DIAGNOSIS — D2261 Melanocytic nevi of right upper limb, including shoulder: Secondary | ICD-10-CM | POA: Diagnosis not present

## 2023-01-24 DIAGNOSIS — L821 Other seborrheic keratosis: Secondary | ICD-10-CM | POA: Diagnosis not present

## 2023-01-30 DIAGNOSIS — E1065 Type 1 diabetes mellitus with hyperglycemia: Secondary | ICD-10-CM | POA: Diagnosis not present

## 2023-02-01 ENCOUNTER — Other Ambulatory Visit: Payer: Self-pay | Admitting: Family

## 2023-02-14 DIAGNOSIS — M79641 Pain in right hand: Secondary | ICD-10-CM | POA: Diagnosis not present

## 2023-02-15 DIAGNOSIS — E1065 Type 1 diabetes mellitus with hyperglycemia: Secondary | ICD-10-CM | POA: Diagnosis not present

## 2023-02-26 DIAGNOSIS — E1065 Type 1 diabetes mellitus with hyperglycemia: Secondary | ICD-10-CM | POA: Diagnosis not present

## 2023-03-05 ENCOUNTER — Encounter (INDEPENDENT_AMBULATORY_CARE_PROVIDER_SITE_OTHER): Payer: Medicare PPO | Admitting: Ophthalmology

## 2023-03-05 DIAGNOSIS — Z794 Long term (current) use of insulin: Secondary | ICD-10-CM

## 2023-03-05 DIAGNOSIS — I10 Essential (primary) hypertension: Secondary | ICD-10-CM

## 2023-03-05 DIAGNOSIS — H43813 Vitreous degeneration, bilateral: Secondary | ICD-10-CM

## 2023-03-05 DIAGNOSIS — E113391 Type 2 diabetes mellitus with moderate nonproliferative diabetic retinopathy without macular edema, right eye: Secondary | ICD-10-CM

## 2023-03-05 DIAGNOSIS — H35033 Hypertensive retinopathy, bilateral: Secondary | ICD-10-CM | POA: Diagnosis not present

## 2023-03-05 DIAGNOSIS — E113592 Type 2 diabetes mellitus with proliferative diabetic retinopathy without macular edema, left eye: Secondary | ICD-10-CM

## 2023-03-05 DIAGNOSIS — D3132 Benign neoplasm of left choroid: Secondary | ICD-10-CM | POA: Diagnosis not present

## 2023-03-10 ENCOUNTER — Other Ambulatory Visit: Payer: Self-pay | Admitting: Family

## 2023-03-29 ENCOUNTER — Other Ambulatory Visit: Payer: Self-pay

## 2023-03-29 DIAGNOSIS — E1065 Type 1 diabetes mellitus with hyperglycemia: Secondary | ICD-10-CM | POA: Diagnosis not present

## 2023-04-02 ENCOUNTER — Other Ambulatory Visit: Payer: Medicare PPO

## 2023-04-02 DIAGNOSIS — E1065 Type 1 diabetes mellitus with hyperglycemia: Secondary | ICD-10-CM | POA: Diagnosis not present

## 2023-04-02 LAB — LIPID PANEL
Cholesterol: 151 mg/dL (ref <200)
HDL: 31 mg/dL — ABNORMAL LOW (ref >=40)
LDL Cholesterol (Calc): 87 mg/dL
Non-HDL Cholesterol (Calc): 120 mg/dL (ref <130)
Total CHOL/HDL Ratio: 4.9 (calc) (ref <5.0)
Triglycerides: 241 mg/dL — ABNORMAL HIGH (ref <150)

## 2023-04-02 LAB — HEMOGLOBIN A1C
Hgb A1c MFr Bld: 7.4 %{Hb} — ABNORMAL HIGH (ref <5.7)
Mean Plasma Glucose: 166 mg/dL
eAG (mmol/L): 9.2 mmol/L

## 2023-04-03 ENCOUNTER — Encounter: Payer: Self-pay | Admitting: Endocrinology

## 2023-04-06 ENCOUNTER — Encounter: Payer: Self-pay | Admitting: Endocrinology

## 2023-04-06 ENCOUNTER — Ambulatory Visit: Payer: Medicare PPO | Admitting: Endocrinology

## 2023-04-06 VITALS — BP 156/82 | HR 66 | Ht 67.0 in | Wt 308.0 lb

## 2023-04-06 DIAGNOSIS — E291 Testicular hypofunction: Secondary | ICD-10-CM | POA: Diagnosis not present

## 2023-04-06 DIAGNOSIS — E1065 Type 1 diabetes mellitus with hyperglycemia: Secondary | ICD-10-CM | POA: Diagnosis not present

## 2023-04-06 NOTE — Progress Notes (Signed)
 Outpatient Endocrinology Note Paul Colavito, MD  04/06/23  Patient's Name: Paul Adkins    DOB: 05-24-1945    MRN: [id number]                                                    REASON OF VISIT: Follow up of type 1 diabetes mellitus currie  PCP: Jason Leita Repine, FNP  HISTORY OF PRESENT ILLNESS:   Paul Adkins is a 78 y.o. old male with past medical history listed below, is here for follow up of type 1 diabetes mellitus and hypogonadism.    Pertinent Diabetes History: Patient was previously seen by Dr. Von and was last time seen in August 2024. Patient was diagnosed with type 1 diabetes mellitus in 1978.  He has been on insulin  pump therapy.  Chronic Diabetes Complications : Retinopathy: yes. Last ophthalmology exam was done on annually, following with ophthalmology regularly, Dr. Will.   Nephropathy: CKD IIIb, on ACE/ARB / olmesartan , following with nephrology. Peripheral neuropathy: no Coronary artery disease: on Stroke: yes  Relevant comorbidities and cardiovascular risk factors: Obesity: yes Body mass index is 48.24 kg/m.  Hypertension: Yes  Hyperlipidemia : Yes, on statin.   Current / Home Diabetic regimen includes:  Tandem t:slim insulin  pump on control IQ with Dexcom G6.  Using NovoLog  U100.  Insulin  Pump setting:  Basal ( total 69.80) MN- 2.80u/hour 4AM- 2.750  8AM- 2.90 10AM- 2.90 6PM-   3.20  Bolus CHO Ratio (1unit:CHO) MN- 1:4  Correction/Sensitivity: MN- 1:20 6PM-    1:16  Target: 110  Active insulin  time: 5 hours  Prior diabetic medications: He had tried in invokana  in the past. Ozempic  in the past, later stopped not sure about side effect, patient does not want to go back on it.    CONTINUOUS GLUCOSE MONITORING SYSTEM (CGMS) / INSULIN  PUMP INTERPRETATION:                         Tandem Pump & Sensor Download (Reviewed and summarized below.) Pump: Dexcom G6 and Tandem t:slim Dates: January 25 to April 06, 2023, 14  days     Trends:  Patient has frequent hyperglycemia with blood sugar up to 200-250 range postprandially mostly related to missed meal boluses and sometimes related to not enough meal boluses although meal bolus was before eating.  Blood sugar in between the meals and overnight are acceptable.  He has been using frequent sleep mode and blood sugar has remained in the better range.  No concerning hypoglycemia.  Hypoglycemia: Patient has no hypoglycemic episodes. Patient has hypoglycemia awareness.    Factors modifying glucose control: 1.  Diabetic diet assessment: 3 meals a day.  2.  Staying active or exercising: No formal exercise.  3.  Medication compliance: compliant most of the time.  # Hypogonadism : - He has had hypogonadotropic hypogonadism, he has been on testosterone  supplementation since about 2007. Had been on AndroGel  1.62% pump.  Currently on Jatenzo  237 mg twice a day  Interval history  Pump and CGM data as reviewed and noted above.  Hemoglobin A1c recently 7.4% slight improvement.  Overall feeling okay.  No new complaints today.  He has been on Jatenzo  2 times a day.  Recent lab results reviewed, he has mild elevated triglyceride, appropriate as sample was nonfasting.  LDL 87  acceptable.  REVIEW OF SYSTEMS As per history of present illness.   PAST MEDICAL HISTORY: Past Medical History:  Diagnosis Date   Carpal tunnel syndrome    Chronic kidney disease    Depression    Diabetes mellitus without complication (HCC)    ED (erectile dysfunction)    Edema    Hyperlipidemia    Hypertension    Lumbar disc disease    Neuropathy    Sleep apnea    Stroke (HCC)     PAST SURGICAL HISTORY: Past Surgical History:  Procedure Laterality Date   BACK SURGERY     VASECTOMY      ALLERGIES: No Known Allergies  FAMILY HISTORY:  Family History  Problem Relation Age of Onset   Osteoarthritis Mother    Cancer Father        Prostate   Cancer Brother    Drug abuse  Brother    Diabetes Neg Hx     SOCIAL HISTORY: Social History   Socioeconomic History   Marital status: Single    Spouse name: Not on file   Number of children: Not on file   Years of education: Not on file   Highest education level: Not on file  Occupational History   Occupation: retired    Comment: Doctor, 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, Music  Tobacco Use   Smoking status: Never    Passive exposure: Never   Smokeless tobacco: Never  Vaping Use   Vaping status: Never Used  Substance and Sexual Activity   Alcohol use: No   Drug use: No   Sexual activity: Not on file  Other Topics Concern   Not on file  Social History Narrative   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 [employer] Strain: Low Risk  (09/05/2022)   Overall Financial Resource Strain (CARDIA)    Difficulty of Paying Living Expenses: Not hard at all  Food Insecurity: Low Risk  (12/28/2022)   Received from [facility]   Hunger Vital Sign    Worried About Running Out of Food in the Last Year: Never true    Ran Out of Food in the Last Year: Never true  Transportation Needs: No Transportation Needs (12/28/2022)   Received from [facility]    In the past 12 months, has lack of reliable transportation kept you from medical appointments, meetings, work or from getting things needed for daily living? : No  Physical Activity: Inactive (09/05/2022)   Exercise Vital Sign    Days of Exercise per Week: 0 days    Minutes of Exercise per Session: 0 min  Stress: No Stress Concern Present (09/05/2022)   [employer] of Occupational Health - Occupational Stress Questionnaire    Feeling of Stress : Only a little  Social Connections: Unknown (09/05/2022)   Social Connection and Isolation Panel [NHANES]    Frequency of Communication with Friends and Family: Twice a week    Frequency of Social Gatherings with Friends and Family: Once a week    Attends Religious  Services: Never    Database Administrator or Organizations: Yes    Attends Engineer, Structural: More than 4 times per year    Marital Status: Patient declined    MEDICATIONS:  Current Outpatient Medications  Medication Sig Dispense Refill   atorvastatin  (LIPITOR) 20 MG tablet Take 1 tablet (20 mg total) by mouth daily. 90 tablet 1   carvedilol (COREG) 6.25 MG  tablet Take 6.25 mg by mouth 2 (two) times daily with a meal.     COMIRNATY SUSP injection Inject into the muscle.     furosemide  (LASIX ) 40 MG tablet Take 1 tablet (40 mg total) by mouth 2 (two) times daily as needed. 180 tablet 0   glucose blood (BAYER CONTOUR NEXT TEST) test strip Use as instructed to check blood sugars 4 times per day dx code E10.65 150 each 5   Insulin  Infusion Pump (T:SLIM X2 CONTROL-IQ 7.8 PUMP) DEVI by Does not apply route.     JATENZO  237 MG CAPS 1 CAPSULE WITH BREAKFAST AND 1 WITH DINNER 60 capsule 3   methylphenidate  (RITALIN ) 10 MG tablet Take 1 tablet up to twice a day 60 tablet 0   NOVOLOG  100 UNIT/ML injection USE MAX OF 170 UNITS UNDER THE SKIN DAILY VIA INSULIN  PUMP. 60 mL PRN   olmesartan  (BENICAR ) 40 MG tablet Take 1 tablet (40 mg total) by mouth daily. 30 tablet 3   PARoxetine  (PAXIL ) 40 MG tablet Take 1 tablet (40 mg total) by mouth daily. 90 tablet 3   Polyethylene Glycol 3350 (MIRALAX PO) Take by mouth every other day.     spironolactone (ALDACTONE) 50 MG tablet Take 50 mg by mouth daily.     VITAMIN D , CHOLECALCIFEROL, PO Take 6,000 Int'l Units/day by mouth.     No current facility-administered medications for this visit.    PHYSICAL EXAM: Vitals:   04/06/23 0905  BP: (!) 156/82  Pulse: 66  SpO2: 97%  Weight: (!) 308 lb (139.7 kg)  Height: 5' 7 (1.702 m)    Body mass index is 48.24 kg/m.  Wt Readings from Last 3 Encounters:  04/06/23 (!) 308 lb (139.7 kg)  01/03/23 (!) 309 lb 3.2 oz (140.3 kg)  12/26/22 (!) 312 lb 12.8 oz (141.9 kg)    General: Well developed, well  nourished male in no apparent distress.  HEENT: AT/[place], no external lesions.  Eyes: Conjunctiva clear and no icterus. Neck: Neck supple  Lungs: Respirations not labored Neurologic: Alert, oriented, normal speech Extremities / Skin: Dry.  Psychiatric: Does not appear depressed or anxious  Diabetic Foot Exam - Simple   No data filed     LABS Reviewed Lab Results  Component Value Date   HGBA1C 7.4 (H) 04/02/2023   HGBA1C 7.5 (H) 12/25/2022   HGBA1C 7.5 (H) 07/10/2022   Lab Results  Component Value Date   FRUCTOSAMINE 219 09/25/2022   FRUCTOSAMINE 256 08/02/2015   Lab Results  Component Value Date   CHOL 151 04/02/2023   HDL 31 (L) 04/02/2023   LDLCALC 87 04/02/2023   LDLDIRECT 67.0 08/02/2015   TRIG 241 (H) 04/02/2023   CHOLHDL 4.9 04/02/2023   Lab Results  Component Value Date   MICRALBCREAT 5 08/21/2022   MICRALBCREAT 5 08/18/2022   Lab Results  Component Value Date   CREATININE 1.65 (H) 12/25/2022   Lab Results  Component Value Date   GFR 40.50 (L) 12/25/2022    ASSESSMENT / PLAN  1. Uncontrolled type 1 diabetes mellitus with hyperglycemia (HCC)   2. Hypogonadism male     Diabetes Mellitus type 1, complicated by diabetic retinopathy/CKD. - Diabetic status / severity: Uncontrolled.  Lab Results  Component Value Date   HGBA1C 7.4 (H) 04/02/2023   Patient has high insulin  resistance to obesity.  - Hemoglobin A1c goal <7%   Patient has high insulin  resistance, requiring insulin  close to 1 unit /kg body weight.  He will benefit  from GLP-1 receptor agonist to help with insulin  sensitivity and also promote weight loss.  This will help to require less amount of insulin .  I would not plan for metformin due to renal insufficiency/CKD.  Patient is not ready for GLP-1 receptor agonist at this time however he will think about it.  Changed correction factor  as follows.  - Medications:  Insulin  pump setting changed as follows:  Tandem t:slim insulin  pump on  control IQ with Dexcom G6.  Using NovoLog  U100.  Insulin  Pump setting:  Basal ( total 69.80) MN- 2.80u/hour 4AM- 2.750  8AM- 2.90 10AM- 2.90 6PM-        3.20  Bolus CHO Ratio (1unit:CHO) MN- 1:4  Correction/Sensitivity: MN- 1:20 , changed to 1:14 6PM-       1:16,  changed to 1:14  Target: 110  Active insulin  time: 5 hours  - Home glucose testing: continue CGM and check blood glucose as needed.  - Discussed/ Gave Hypoglycemia treatment plan.  # Consult : not required at this time.   # Annual urine for microalbuminuria/ creatinine ratio, no microalbuminuria currently, continue ACE/ARB /olmesartan  / he has CKD, following with nephrology. Last  Lab Results  Component Value Date   MICRALBCREAT 5 08/21/2022    # Foot check nightly.  # Annual dilated diabetic eye exams.   - Diet: Make healthy diabetic food choices - Life style / activity / exercise: Discussed.  Not able to exercise.  2. Blood pressure  -  BP Readings from Last 1 Encounters:  04/06/23 (!) 156/82    - Control is not in target. Monitor at home and discussed with primary care provider if remained high. - No change in current plans.  3. Lipid status / Hyperlipidemia - Last  Lab Results  Component Value Date   LDLCALC 87 04/02/2023   - Continue atorvastatin  20 mg daily.    # Hypogonadism: -Continue Jatenzo  237 mg 2 times a day with meals. -Check total testosterone  in next follow-up visit.  Diagnoses and all orders for this visit:  Uncontrolled type 1 diabetes mellitus with hyperglycemia (HCC) -     BASIC METABOLIC PANEL WITH GFR -     Hemoglobin A1c  Hypogonadism male -     Testosterone , Total, LC/MS/MS    DISPOSITION Follow up in clinic in 3 months suggested.  Labs prior to follow-up visit.   All questions answered and patient verbalized understanding of the plan.  Elani Delph, MD [facility] End[facility] [address] [place], [address] [place], [place]  [address] Phone # [phone]  At least part of this note was generated using voice recognition software. Inadvertent word errors may have occurred, which were not recognized during the proofreading process.

## 2023-04-09 DIAGNOSIS — S6991XA Unspecified injury of right wrist, hand and finger(s), initial encounter: Secondary | ICD-10-CM | POA: Diagnosis not present

## 2023-04-09 DIAGNOSIS — M65331 Trigger finger, right middle finger: Secondary | ICD-10-CM | POA: Diagnosis not present

## 2023-04-16 DIAGNOSIS — N1832 Chronic kidney disease, stage 3b: Secondary | ICD-10-CM | POA: Diagnosis not present

## 2023-04-19 DIAGNOSIS — S6991XA Unspecified injury of right wrist, hand and finger(s), initial encounter: Secondary | ICD-10-CM | POA: Diagnosis not present

## 2023-04-19 DIAGNOSIS — M65331 Trigger finger, right middle finger: Secondary | ICD-10-CM | POA: Diagnosis not present

## 2023-04-24 DIAGNOSIS — E109 Type 1 diabetes mellitus without complications: Secondary | ICD-10-CM | POA: Diagnosis not present

## 2023-04-24 DIAGNOSIS — N2581 Secondary hyperparathyroidism of renal origin: Secondary | ICD-10-CM | POA: Diagnosis not present

## 2023-04-24 DIAGNOSIS — N1832 Chronic kidney disease, stage 3b: Secondary | ICD-10-CM | POA: Diagnosis not present

## 2023-04-24 DIAGNOSIS — I129 Hypertensive chronic kidney disease with stage 1 through stage 4 chronic kidney disease, or unspecified chronic kidney disease: Secondary | ICD-10-CM | POA: Diagnosis not present

## 2023-04-30 DIAGNOSIS — E1065 Type 1 diabetes mellitus with hyperglycemia: Secondary | ICD-10-CM | POA: Diagnosis not present

## 2023-05-04 ENCOUNTER — Other Ambulatory Visit: Payer: Self-pay | Admitting: Family

## 2023-05-04 ENCOUNTER — Other Ambulatory Visit: Payer: Self-pay | Admitting: Endocrinology

## 2023-05-07 DIAGNOSIS — M65331 Trigger finger, right middle finger: Secondary | ICD-10-CM | POA: Diagnosis not present

## 2023-05-07 DIAGNOSIS — M65949 Unspecified synovitis and tenosynovitis, unspecified hand: Secondary | ICD-10-CM | POA: Diagnosis not present

## 2023-05-30 DIAGNOSIS — E1065 Type 1 diabetes mellitus with hyperglycemia: Secondary | ICD-10-CM | POA: Diagnosis not present

## 2023-06-05 ENCOUNTER — Other Ambulatory Visit: Payer: Self-pay

## 2023-06-16 ENCOUNTER — Other Ambulatory Visit: Payer: Self-pay | Admitting: Family

## 2023-06-20 ENCOUNTER — Other Ambulatory Visit: Payer: Self-pay | Admitting: Endocrinology

## 2023-06-29 ENCOUNTER — Other Ambulatory Visit: Payer: Medicare PPO

## 2023-06-29 DIAGNOSIS — E291 Testicular hypofunction: Secondary | ICD-10-CM | POA: Diagnosis not present

## 2023-06-29 DIAGNOSIS — E1065 Type 1 diabetes mellitus with hyperglycemia: Secondary | ICD-10-CM | POA: Diagnosis not present

## 2023-07-02 ENCOUNTER — Encounter: Payer: Self-pay | Admitting: Endocrinology

## 2023-07-04 ENCOUNTER — Other Ambulatory Visit: Payer: Self-pay

## 2023-07-04 ENCOUNTER — Other Ambulatory Visit: Payer: Self-pay | Admitting: Endocrinology

## 2023-07-04 ENCOUNTER — Ambulatory Visit: Payer: Medicare PPO | Admitting: Endocrinology

## 2023-07-04 ENCOUNTER — Encounter: Payer: Self-pay | Admitting: Endocrinology

## 2023-07-04 DIAGNOSIS — Z5181 Encounter for therapeutic drug level monitoring: Secondary | ICD-10-CM

## 2023-07-04 DIAGNOSIS — G4733 Obstructive sleep apnea (adult) (pediatric): Secondary | ICD-10-CM | POA: Diagnosis not present

## 2023-07-04 DIAGNOSIS — E1065 Type 1 diabetes mellitus with hyperglycemia: Secondary | ICD-10-CM

## 2023-07-04 DIAGNOSIS — E291 Testicular hypofunction: Secondary | ICD-10-CM

## 2023-07-04 DIAGNOSIS — E88819 Insulin resistance, unspecified: Secondary | ICD-10-CM | POA: Diagnosis not present

## 2023-07-04 MED ORDER — TIRZEPATIDE 2.5 MG/0.5ML ~~LOC~~ SOAJ: 2.5000 mg | SUBCUTANEOUS | 0 refills | Status: DC

## 2023-07-04 MED ORDER — HUMALOG KWIKPEN 200 UNIT/ML ~~LOC~~ SOPN: PEN_INJECTOR | SUBCUTANEOUS | 3 refills | Status: DC

## 2023-07-04 MED ORDER — TIRZEPATIDE 5 MG/0.5ML ~~LOC~~ SOAJ: 5.0000 mg | SUBCUTANEOUS | 3 refills | Status: DC

## 2023-07-04 NOTE — Progress Notes (Signed)
 Outpatient Endocrinology Note Paul Andree Heeg, MD  07/04/23  Patient's Name: Paul Adkins    DOB: 10-Mar-1945    MRN: [id number]                                                    REASON OF VISIT: Follow up of type 1 diabetes mellitus Paul Adkins  PCP: Adra Alanis, FNP  HISTORY OF PRESENT ILLNESS:   Paul Adkins is a 78 y.o. old male with past medical history listed below, is here for follow up of type 1 diabetes mellitus and hypogonadism.    Pertinent Diabetes History: Patient was previously seen by Dr. Hubert Madden and was last time seen in August 2024. Patient was diagnosed with type 1 diabetes mellitus in 1978.  He has been on insulin  pump therapy.  Chronic Diabetes Complications : Retinopathy: yes. Last ophthalmology exam was done on annually, following with ophthalmology regularly, Dr. Alla Isaacs.   Nephropathy: CKD IIIb, on ACE/ARB / olmesartan , following with nephrology. Peripheral neuropathy: no Coronary artery disease: on Stroke: yes  Relevant comorbidities and cardiovascular risk factors: Obesity: yes There is no height or weight on file to calculate BMI.  Hypertension: Yes  Hyperlipidemia : Yes, on statin.   Current / Home Diabetic regimen includes:  Tandem t:slim insulin  pump on control IQ with Dexcom G6.  Using NovoLog  U100.  Insulin  Pump setting:  Basal ( total 70.4) MN- 2.80u/hour 4AM- 2.750  8AM- 2.90 10AM- 2.90 6PM-   3.20  Bolus CHO Ratio (1unit:CHO) MN- 1:4  Correction/Sensitivity: MN- 1:14 6PM-    1:14  Target: 110  Active insulin  time: 5 hours  Prior diabetic medications: He had tried in invokana  in the past. Ozempic  in the past, later stopped not sure about side effect, patient does not want to go back on it.    CONTINUOUS GLUCOSE MONITORING SYSTEM (CGMS) / INSULIN  PUMP INTERPRETATION:                         Tandem Pump & Sensor Download (Reviewed and summarized below.) Pump: Dexcom G6 and Tandem t:slim Dates: April 10 to May 7 ,  2025, 14 days  Total daily dose 136.  Basal 52%, bolus 48%, manual bolus 55%, control IQ bolus 45%.  Average daily carbs 39 g. GMI 7.2%, average blood sugar 162, time in range 75%, CGM use 100%.  Control IQ 100%.      Trends: Mostly acceptable blood sugar with random hyperglycemia with blood sugar up to 200-350 related to no meal boluses however he uses manual bolus and occasionally with not enough meal bolus.  Blood sugar overnight and in between the meals are acceptable.  No hypoglycemia.  Hypoglycemia: Patient has no hypoglycemic episodes. Patient has hypoglycemia awareness.    Factors modifying glucose control: 1.  Diabetic diet assessment: 3 meals a day.  2.  Staying active or exercising: No formal exercise.  3.  Medication compliance: compliant most of the time.  # Hypogonadism : - He has had hypogonadotropic hypogonadism, he has been on testosterone  supplementation since about 2007. Had been on AndroGel  1.62% pump.  Currently on Jatenzo  237 mg twice a day  Interval history  Pump and CGM data as reviewed above.  Hemoglobin A1c 7.6%.  Still with random postprandial hyperglycemia.  Recent laboratory results reviewed.  Results of total testosterone   are awaited.  He has been taking Jatenzo  237 mg 2 times a day.  No urinary symptoms.  He would like to try GLP-1 receptor agonist to help with weight loss and also help with diabetes with high insulin  resistance.  He has obstructive sleep apnea and using CPAP regularly.  No other complaints today.  REVIEW OF SYSTEMS As per history of present illness.   PAST MEDICAL HISTORY: Past Medical History:  Diagnosis Date   Carpal tunnel syndrome    Chronic kidney disease    Depression    Diabetes mellitus without complication (HCC)    ED (erectile dysfunction)    Edema    Hyperlipidemia    Hypertension    Lumbar disc disease    Neuropathy    Sleep apnea    Stroke (HCC)     PAST SURGICAL HISTORY: Past Surgical History:   Procedure Laterality Date   BACK SURGERY     VASECTOMY      ALLERGIES: No Known Allergies  FAMILY HISTORY:  Family History  Problem Relation Age of Onset   Osteoarthritis Mother    Cancer Father        Prostate   Cancer Brother    Drug abuse Brother    Diabetes Neg Hx     SOCIAL HISTORY: Social History   Socioeconomic History   Marital status: Single    Spouse name: Not on file   Number of children: Not on file   Years of education: Not on file   Highest education level: Not on file  Occupational History   Occupation: retired    Comment: Doctor, 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, Music  Tobacco Use   Smoking status: Never    Passive exposure: Never   Smokeless tobacco: Never  Vaping Use   Vaping status: Never Used  Substance and Sexual Activity   Alcohol use: No   Drug use: No   Sexual activity: Not on file  Other Topics Concern   Not on file  Social History Narrative   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 [employer] Strain: Low Risk  (09/05/2022)   Overall Financial Resource Strain (CARDIA)    Difficulty of Paying Living Expenses: Not hard at all  Food Insecurity: Low Risk  (12/28/2022)   Received from [facility]   Hunger Vital Sign    Worried About Running Out of Food in the Last Year: Never true    Ran Out of Food in the Last Year: Never true  Transportation Needs: No Transportation Needs (12/28/2022)   Received from [facility]    In the past 12 months, has lack of reliable transportation kept you from medical appointments, meetings, work or from getting things needed for daily living? : No  Physical Activity: Inactive (09/05/2022)   Exercise Vital Sign    Days of Exercise per Week: 0 days    Minutes of Exercise per Session: 0 min  Stress: No Stress Concern Present (09/05/2022)   [employer] of Occupational Health - Occupational Stress Questionnaire    Feeling of Stress : Only a little   Social Connections: Unknown (09/05/2022)   Social Connection and Isolation Panel [NHANES]    Frequency of Communication with Friends and Family: Twice a week    Frequency of Social Gatherings with Friends and Family: Once a week    Attends Religious Services: Never    Database administrator or Organizations: Yes    Attends  Engineer, structural: More than 4 times per year    Marital Status: Patient declined    MEDICATIONS:  Current Outpatient Medications  Medication Sig Dispense Refill   tirzepatide (MOUNJARO) 2.5 MG/0.5ML Pen Inject 2.5 mg into the skin once a week. 2 mL 0   tirzepatide (MOUNJARO) 5 MG/0.5ML Pen Inject 5 mg into the skin once a week. Start after 4 injections of 2.5 mg dose. 6 mL 3   atorvastatin  (LIPITOR) 20 MG tablet TAKE 1 TABLET BY MOUTH EVERY DAY 90 tablet 1   carvedilol (COREG) 6.25 MG tablet Take 6.25 mg by mouth 2 (two) times daily with a meal.     COMIRNATY SUSP injection Inject into the muscle.     furosemide  (LASIX ) 40 MG tablet TAKE 1 TABLET BY MOUTH TWICE A DAY AS NEEDED 180 tablet 0   glucose blood (BAYER CONTOUR NEXT TEST) test strip Use as instructed to check blood sugars 4 times per day dx code E10.65 150 each 5   Insulin  Infusion Pump (T:SLIM X2 CONTROL-IQ 7.8 PUMP) DEVI by Does not apply route.     insulin  lispro (HUMALOG  KWIKPEN) 200 UNIT/ML KwikPen Take up to 180 units/ day via insulin  pump. 30 mL 3   methylphenidate  (RITALIN ) 10 MG tablet Take 1 tablet up to twice a day 60 tablet 0   NOVOLOG  100 UNIT/ML injection USE MAX OF 170 UNITS UNDER THE SKIN DAILY VIA INSULIN  PUMP. 60 mL PRN   olmesartan  (BENICAR ) 40 MG tablet TAKE 1 TABLET BY MOUTH EVERY DAY 90 tablet 2   PARoxetine  (PAXIL ) 40 MG tablet Take 1 tablet (40 mg total) by mouth daily. 90 tablet 3   Polyethylene Glycol 3350 (MIRALAX PO) Take by mouth every other day.     spironolactone (ALDACTONE) 50 MG tablet Take 50 mg by mouth daily.     Testosterone  Undecanoate (JATENZO ) 237 MG CAPS 1  CAPSULE WITH BREAKFAST AND 1 WITH DINNER 60 capsule 5   VITAMIN D , CHOLECALCIFEROL, PO Take 6,000 Int'l Units/day by mouth.     No current facility-administered medications for this visit.    PHYSICAL EXAM: There were no vitals filed for this visit.   There is no height or weight on file to calculate BMI.  Wt Readings from Last 3 Encounters:  04/06/23 (!) 308 lb (139.7 kg)  01/03/23 (!) 309 lb 3.2 oz (140.3 kg)  12/26/22 (!) 312 lb 12.8 oz (141.9 kg)    General: Well developed, well nourished male in no apparent distress.  HEENT: AT/[place], no external lesions.  Eyes: Conjunctiva clear and no icterus. Neck: Neck supple  Lungs: Respirations not labored Neurologic: Alert, oriented, normal speech Extremities / Skin: Dry.  Psychiatric: Does not appear depressed or anxious  Diabetic Foot Exam - Simple   No data filed     LABS Reviewed Lab Results  Component Value Date   HGBA1C 7.6 (H) 06/29/2023   HGBA1C 7.4 (H) 04/02/2023   HGBA1C 7.5 (H) 12/25/2022   Lab Results  Component Value Date   FRUCTOSAMINE 219 09/25/2022   FRUCTOSAMINE 256 08/02/2015   Lab Results  Component Value Date   CHOL 151 04/02/2023   HDL 31 (L) 04/02/2023   LDLCALC 87 04/02/2023   LDLDIRECT 67.0 08/02/2015   TRIG 241 (H) 04/02/2023   CHOLHDL 4.9 04/02/2023   Lab Results  Component Value Date   MICRALBCREAT 5 08/21/2022   MICRALBCREAT 5 08/18/2022   Lab Results  Component Value Date   CREATININE 2.00 (H) 06/29/2023  Lab Results  Component Value Date   GFR 40.50 (L) 12/25/2022    ASSESSMENT / PLAN  1. Uncontrolled type 1 diabetes mellitus with hyperglycemia (HCC)   2. Morbid obesity (HCC)   3. Insulin  resistance   4. Obstructive sleep apnea   5. Hypogonadism male   6. Encounter for medication monitoring     Diabetes Mellitus type 1, complicated by diabetic retinopathy/CKD. - Diabetic status / severity: Uncontrolled.  Lab Results  Component Value Date   HGBA1C 7.6 (H) 06/29/2023    Patient has high insulin  resistance due to obesity.  - Hemoglobin A1c goal <7%   Patient has high insulin  resistance, requiring insulin  close to 1 unit /kg body weight.  He will benefit from GLP-1 receptor agonist to help with insulin  sensitivity and also promote weight loss.  This will help to require less amount of insulin .  I would not plan for metformin due to renal insufficiency/CKD.  Will plan for Mounjaro.   - Medications:  Insulin  pump setting changed as follows:  Tandem t:slim insulin  pump on control IQ with Dexcom G6.  Using NovoLog  U100.  Insulin  Pump setting:  Basal ( total 69.80) MN- 2.80u/hour 4AM- 2.750  8AM- 2.90 10AM- 2.90 6PM-        3.20  Bolus CHO Ratio (1unit:CHO) MN- 1:4, changed to 1:3.5  Correction/Sensitivity: MN- 1:14 6PM-          1:14  Target: 110  Active insulin  time: 5 hours  Due to high dose of insulin  requirement will plan for using insulin  U200.  Sent prescription for Humalog  U200 pen.  Patient is asked to call our clinic after able to get Humalog  U200 to adjust the pump setting.  Prescription for Mounjaro 2.5 mg weekly for 4 weeks and increase to 5 mg weekly.  If Florence Hunt is not covered we will plan with Zepbound for weight loss and with indication of obstructive sleep apnea.  - Home glucose testing: continue CGM and check blood glucose as needed.   - Discussed/ Gave Hypoglycemia treatment plan.  # Consult : not required at this time.   # Annual urine for microalbuminuria/ creatinine ratio, no microalbuminuria currently, continue ACE/ARB /olmesartan  / he has CKD, following with nephrology. Last  Lab Results  Component Value Date   MICRALBCREAT 5 08/21/2022    # Foot check nightly.  # Annual dilated diabetic eye exams.   - Diet: Make healthy diabetic food choices - Life style / activity / exercise: Discussed.  Not able to exercise.  2. Blood pressure  -  BP Readings from Last 1 Encounters:  04/06/23 (!) 156/82    -  Control is not in target. Monitor at home and discussed with primary care provider if remained high. - No change in current plans.  3. Lipid status / Hyperlipidemia - Last  Lab Results  Component Value Date   LDLCALC 87 04/02/2023   - Continue atorvastatin  20 mg daily.    # Hypogonadism: -Continue Jatenzo  237 mg 2 times a day with meals. - Follow-up results of total testosterone  from recent lab. -Will check lab for PSA, hematocrit and AST with next set of labs to monitor testosterone  therapy.  Diagnoses and all orders for this visit:  Uncontrolled type 1 diabetes mellitus with hyperglycemia (HCC) -     Discontinue: insulin  lispro (HUMALOG  KWIKPEN) 200 UNIT/ML KwikPen; Take up to 180 units/ day via insulin  pump. -     tirzepatide (MOUNJARO) 2.5 MG/0.5ML Pen; Inject 2.5 mg into the skin once  a week. -     tirzepatide (MOUNJARO) 5 MG/0.5ML Pen; Inject 5 mg into the skin once a week. Start after 4 injections of 2.5 mg dose. -     Basic Metabolic Panel Without GFR -     Hemoglobin A1c -     Microalbumin / creatinine urine ratio -     insulin  lispro (HUMALOG  KWIKPEN) 200 UNIT/ML KwikPen; Take up to 180 units/ day via insulin  pump.  Morbid obesity (HCC) -     tirzepatide (MOUNJARO) 2.5 MG/0.5ML Pen; Inject 2.5 mg into the skin once a week. -     tirzepatide (MOUNJARO) 5 MG/0.5ML Pen; Inject 5 mg into the skin once a week. Start after 4 injections of 2.5 mg dose.  Insulin  resistance -     tirzepatide (MOUNJARO) 2.5 MG/0.5ML Pen; Inject 2.5 mg into the skin once a week. -     tirzepatide (MOUNJARO) 5 MG/0.5ML Pen; Inject 5 mg into the skin once a week. Start after 4 injections of 2.5 mg dose.  Obstructive sleep apnea -     tirzepatide (MOUNJARO) 2.5 MG/0.5ML Pen; Inject 2.5 mg into the skin once a week. -     tirzepatide (MOUNJARO) 5 MG/0.5ML Pen; Inject 5 mg into the skin once a week. Start after 4 injections of 2.5 mg dose.  Hypogonadism male -     PSA  Encounter for medication  monitoring -     AST -     PSA -     Hematocrit     DISPOSITION Follow up in clinic in 3 months suggested.  Labs prior to follow-up visit as ordered.   All questions answered and patient verbalized understanding of the plan.  Paul Yona Kosek, MD [facility] Endocrinology [facility] [address] [place], [address] [place], [place] [address] Phone # [phone]  At least part of this note was generated using voice recognition software. Inadvertent word errors may have occurred, which were not recognized during the proofreading process.

## 2023-07-04 NOTE — Patient Instructions (Signed)
 I have sent prescription for Mounjaro 2.5 mg weekly for 4 weeks and increase to 5 mg weekly.  If this medicine is not covered we will plan for similar medicine called Zepbound for weight loss and obstructive sleep apnea.   I have sent prescription for Humalog  U200 insulin  to use in the pump.  It comes in insulin  pen.  If your medical insurance covers, is better to use U200 type of insulin  because of high dose of insulin  requirement.  We need to change the pump setting to use U200 insulin  into your pump.  Let us  know if you can get U200 Humalog  insulin  we can make a plan to change the pump setting.  Until we are able to change the pump setting you need to use NovoLog  U100 insulin .

## 2023-07-05 LAB — BASIC METABOLIC PANEL WITH GFR
BUN/Creatinine Ratio: 24 (calc) — ABNORMAL HIGH (ref 6–22)
BUN: 47 mg/dL — ABNORMAL HIGH (ref 7–25)
CO2: 27 mmol/L (ref 20–32)
Calcium: 9.1 mg/dL (ref 8.6–10.3)
Chloride: 105 mmol/L (ref 98–110)
Creat: 2 mg/dL — ABNORMAL HIGH (ref 0.70–1.28)
Glucose, Bld: 107 mg/dL (ref 65–139)
Potassium: 4.8 mmol/L (ref 3.5–5.3)
Sodium: 138 mmol/L (ref 135–146)
eGFR: 34 mL/min/{1.73_m2} — ABNORMAL LOW (ref >=60)

## 2023-07-05 LAB — HEMOGLOBIN A1C
Hgb A1c MFr Bld: 7.6 % — ABNORMAL HIGH (ref <5.7)
Mean Plasma Glucose: 171 mg/dL
eAG (mmol/L): 9.5 mmol/L

## 2023-07-05 LAB — TESTOSTERONE, TOTAL, LC/MS/MS: Testosterone, Total, LC-MS-MS: 159 ng/dL — ABNORMAL LOW (ref 250–1100)

## 2023-07-16 ENCOUNTER — Telehealth: Payer: Self-pay | Admitting: Nutrition

## 2023-07-16 NOTE — Telephone Encounter (Signed)
 Patient called and said he got his U-200 insulin  and wants to come in tomorrow to help with making the setting changes

## 2023-07-16 NOTE — Telephone Encounter (Signed)
 I have changed the insulin  pump setting as follows for using Humalog  U200.  Tandem t:slim insulin  pump on control IQ with Dexcom G6.  Using NovoLog  U100.   Insulin  Pump setting:  Basal ( total 69.80) MN-2.80u/hour 4AM-          2.750  8AM-2.90 10AM-2.90 6PM-        3.20   Bolus CHO Ratio (1unit:CHO) MN-1:4, changed to 1:3.5   Correction/Sensitivity: MN-1:14 6PM-          1:14   Target: 110   Active insulin  time: 5 hours   Tandem t:slim insulin  pump on control IQ with Dexcom G6.  Using Humalog  U200.  Insulin  Pump setting:  Basal ( total 69.80) MN-1.40 u/hour 4AM- 1.35  8AM- 1.45 10AM-1.45 6PM- 1.60   Bolus CHO Ratio (1unit:CHO) MN-1:7   Correction/Sensitivity: MN -1:28 6PM-1:28   Target: 110   Active insulin  time: 5 hours  Paul Bralen Wiltgen, MD [facility] Endocrinology [facility] [address] [place], [address] [place], [place] [address] Phone # [phone]

## 2023-07-17 ENCOUNTER — Encounter: Attending: Endocrinology | Admitting: Nutrition

## 2023-07-17 DIAGNOSIS — E108 Type 1 diabetes mellitus with unspecified complications: Secondary | ICD-10-CM | POA: Diagnosis not present

## 2023-07-17 NOTE — Patient Instructions (Signed)
 Bolus as usual for all meals and snacks.

## 2023-07-17 NOTE — Progress Notes (Signed)
 Settings were changed in his pump because he is switching to Humalog  U-200.  :  New settings Basal ( total 69.80) MN-1.40 u/hour, 4AM- 1.35 ,   8AM- 1.45,    10AM-1.45   6PM- 1.60 Bolus:   CHO Ratio (1unit:CHO) MN-1:7  Correction/Sensitivity: MN -1:28 6PM-1:28  Target: 110  Active insulin  time: 5 hours He was show how to fill a cartridge by drawing the insulin  from the pen into the syringe and filling his cartridge.  He did this and had no questions for me at this time.

## 2023-08-08 DIAGNOSIS — I129 Hypertensive chronic kidney disease with stage 1 through stage 4 chronic kidney disease, or unspecified chronic kidney disease: Secondary | ICD-10-CM | POA: Diagnosis not present

## 2023-08-08 DIAGNOSIS — N1832 Chronic kidney disease, stage 3b: Secondary | ICD-10-CM | POA: Diagnosis not present

## 2023-08-08 DIAGNOSIS — I639 Cerebral infarction, unspecified: Secondary | ICD-10-CM | POA: Diagnosis not present

## 2023-08-08 DIAGNOSIS — E669 Obesity, unspecified: Secondary | ICD-10-CM | POA: Diagnosis not present

## 2023-08-08 DIAGNOSIS — E109 Type 1 diabetes mellitus without complications: Secondary | ICD-10-CM | POA: Diagnosis not present

## 2023-08-09 LAB — LAB REPORT - SCANNED
Albumin, Urine POC: 7.9
Albumin/Creatinine Ratio, Urine, POC: 6
Creatinine, POC: 134.7 mg/dL

## 2023-08-16 DIAGNOSIS — E113592 Type 2 diabetes mellitus with proliferative diabetic retinopathy without macular edema, left eye: Secondary | ICD-10-CM | POA: Diagnosis not present

## 2023-08-16 DIAGNOSIS — H4922 Sixth [abducent] nerve palsy, left eye: Secondary | ICD-10-CM | POA: Diagnosis not present

## 2023-08-16 DIAGNOSIS — E113291 Type 2 diabetes mellitus with mild nonproliferative diabetic retinopathy without macular edema, right eye: Secondary | ICD-10-CM | POA: Diagnosis not present

## 2023-08-16 DIAGNOSIS — H35033 Hypertensive retinopathy, bilateral: Secondary | ICD-10-CM | POA: Diagnosis not present

## 2023-08-16 DIAGNOSIS — H353132 Nonexudative age-related macular degeneration, bilateral, intermediate dry stage: Secondary | ICD-10-CM | POA: Diagnosis not present

## 2023-08-16 DIAGNOSIS — H532 Diplopia: Secondary | ICD-10-CM | POA: Diagnosis not present

## 2023-08-16 LAB — HM DIABETES EYE EXAM

## 2023-08-30 DIAGNOSIS — E1065 Type 1 diabetes mellitus with hyperglycemia: Secondary | ICD-10-CM | POA: Diagnosis not present

## 2023-09-03 ENCOUNTER — Encounter (INDEPENDENT_AMBULATORY_CARE_PROVIDER_SITE_OTHER): Payer: Medicare PPO | Admitting: Ophthalmology

## 2023-09-03 DIAGNOSIS — E113592 Type 2 diabetes mellitus with proliferative diabetic retinopathy without macular edema, left eye: Secondary | ICD-10-CM

## 2023-09-03 DIAGNOSIS — Z794 Long term (current) use of insulin: Secondary | ICD-10-CM | POA: Diagnosis not present

## 2023-09-03 DIAGNOSIS — H353132 Nonexudative age-related macular degeneration, bilateral, intermediate dry stage: Secondary | ICD-10-CM

## 2023-09-03 DIAGNOSIS — E113391 Type 2 diabetes mellitus with moderate nonproliferative diabetic retinopathy without macular edema, right eye: Secondary | ICD-10-CM

## 2023-09-03 DIAGNOSIS — H43813 Vitreous degeneration, bilateral: Secondary | ICD-10-CM | POA: Diagnosis not present

## 2023-09-03 DIAGNOSIS — D3132 Benign neoplasm of left choroid: Secondary | ICD-10-CM | POA: Diagnosis not present

## 2023-09-03 DIAGNOSIS — H35033 Hypertensive retinopathy, bilateral: Secondary | ICD-10-CM | POA: Diagnosis not present

## 2023-09-03 DIAGNOSIS — I10 Essential (primary) hypertension: Secondary | ICD-10-CM

## 2023-09-07 DIAGNOSIS — E1065 Type 1 diabetes mellitus with hyperglycemia: Secondary | ICD-10-CM | POA: Diagnosis not present

## 2023-09-11 ENCOUNTER — Ambulatory Visit (INDEPENDENT_AMBULATORY_CARE_PROVIDER_SITE_OTHER): Payer: Medicare PPO | Admitting: *Deleted

## 2023-09-11 VITALS — Ht 67.0 in | Wt 312.0 lb

## 2023-09-11 DIAGNOSIS — Z0000 Encounter for general adult medical examination without abnormal findings: Secondary | ICD-10-CM | POA: Diagnosis not present

## 2023-09-11 NOTE — Progress Notes (Signed)
 Subjective:   [name] is a 78 y.o. who presents for a Medicare Wellness preventive visit.  As a reminder, Annual Wellness Visits don't include a physical exam, and some assessments may be limited, especially if this visit is performed virtually. We may recommend an in-person follow-up visit with your provider if needed.  Visit Complete: Virtual I connected with  Paul Adkins on 09/11/23 by a audio enabled telemedicine application and verified that I am speaking with the correct person using two identifiers.  Patient Location: Home  Provider Location: Office/Clinic  I discussed the limitations of evaluation and management by telemedicine. The patient expressed understanding and agreed to proceed.  Vital Signs: Because this visit was a virtual/telehealth visit, some criteria may be missing or patient reported. Any vitals not documented were not able to be obtained and vitals that have been documented are patient reported.  VideoDeclined- This patient declined Librarian, academic. Therefore the visit was completed with audio only.  Persons Participating in Visit: Patient.  AWV Questionnaire: No: Patient Medicare AWV questionnaire was not completed prior to this visit.  Cardiac Risk Factors include: advanced age (>42men, >40 women);male gender;obesity (BMI >30kg/m2);dyslipidemia;diabetes mellitus;Other (see comment), Risk factor comments: OSA     Objective:    Today's Vitals   09/11/23 1342  Weight: (!) 312 lb (141.5 kg)  Height: 5' 7 (1.702 m)   Body mass index is 48.87 kg/m.     09/11/2023    2:01 PM 09/06/2022    2:01 PM 01/11/2022    8:58 AM 09/05/2021    9:05 AM 06/21/2021    9:09 AM 09/30/2020   10:56 AM 10/20/2015    1:34 PM  Advanced Directives  Does Patient Have a Medical Advance Directive? Yes Yes Yes Yes Yes Yes No   Type of Estate agent of [place];Living will Healthcare Power of [place];Living will Healthcare  Power of [place];Living will Healthcare Power of [place];Living will Healthcare Power of [place];Living will    Does patient want to make changes to medical advance directive? No - Patient declined No - Patient declined No - Patient declined  No - Patient declined    Copy of Healthcare Power of Attorney in Chart? No - copy requested No - copy requested No - copy requested No - copy requested No - copy requested       Data saved with a previous flowsheet row definition    Current Medications (verified) Outpatient Encounter Medications as of 09/11/2023  Medication Sig   atorvastatin  (LIPITOR) 20 MG tablet TAKE 1 TABLET BY MOUTH EVERY DAY   carvedilol (COREG) 6.25 MG tablet Take 6.25 mg by mouth 2 (two) times daily with a meal.   furosemide  (LASIX ) 40 MG tablet TAKE 1 TABLET BY MOUTH TWICE A DAY AS NEEDED   glucose blood (BAYER CONTOUR NEXT TEST) test strip Use as instructed to check blood sugars 4 times per day dx code E10.65   Insulin  Infusion Pump (T:SLIM X2 CONTROL-IQ 7.8 PUMP) DEVI by Does not apply route.   insulin  lispro (HUMALOG  KWIKPEN) 200 UNIT/ML KwikPen Take up to 180 units/ day via insulin  pump.   methylphenidate  (RITALIN ) 10 MG tablet Take 1 tablet up to twice a day   Multiple Vitamins-Minerals (EYE HEALTH AREDS 2 PO) Take 2 capsules by mouth daily.   olmesartan  (BENICAR ) 40 MG tablet TAKE 1 TABLET BY MOUTH EVERY DAY   PARoxetine  (PAXIL ) 40 MG tablet Take 1 tablet (40 mg total) by mouth daily.   Polyethylene  Glycol 3350 (MIRALAX PO) Take by mouth every other day.   spironolactone (ALDACTONE) 50 MG tablet Take 50 mg by mouth daily.   Testosterone  Undecanoate (JATENZO ) 237 MG CAPS 1 CAPSULE WITH BREAKFAST AND 1 WITH DINNER   tirzepatide  (MOUNJARO ) 2.5 MG/0.5ML Pen Inject 2.5 mg into the skin once a week.   tirzepatide  (MOUNJARO ) 5 MG/0.5ML Pen Inject 5 mg into the skin once a week. Start after 4 injections of 2.5 mg dose.   VITAMIN D , CHOLECALCIFEROL, PO Take 6,000 Int'l Units/day  by mouth.   [DISCONTINUED] COMIRNATY SUSP injection Inject into the muscle.   [DISCONTINUED] NOVOLOG  100 UNIT/ML injection USE MAX OF 170 UNITS UNDER THE SKIN DAILY VIA INSULIN  PUMP.   No facility-administered encounter medications on file as of 09/11/2023.    Allergies (verified) Patient has no known allergies.   History: Past Medical History:  Diagnosis Date   Carpal tunnel syndrome    Chronic kidney disease    Depression    Diabetes mellitus without complication (HCC)    ED (erectile dysfunction)    Edema    Hyperlipidemia    Hypertension    Lumbar disc disease    Neuropathy    Sleep apnea    Stroke [facility])    Past Surgical History:  Procedure Laterality Date   BACK SURGERY     VASECTOMY     Family History  Problem Relation Age of Onset   Osteoarthritis Mother    Cancer Father        Prostate   Cancer Brother    Drug abuse Brother    Diabetes Neg Hx    Social History   Socioeconomic History   Marital status: Single    Spouse name: Not on file   Number of children: Not on file   Years of education: Not on file   Highest education level: Not on file  Occupational History   Occupation: retired    Comment: Doctor, 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, Music  Tobacco Use   Smoking status: Never    Passive exposure: Never   Smokeless tobacco: Never  Vaping Use   Vaping status: Never Used  Substance and Sexual Activity   Alcohol use: No   Drug use: No   Sexual activity: Not on file  Other Topics Concern   Not on file  Social History Narrative   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 [employer] Strain: Low Risk  (09/11/2023)   Overall Financial Resource Strain (CARDIA)    Difficulty of Paying Living Expenses: Not hard at all  Food Insecurity: No Food Insecurity (09/11/2023)   Hunger Vital Sign    Worried About Running Out of Food in the Last Year: Never true    Ran Out of Food in the Last Year: Never true   Transportation Needs: No Transportation Needs (09/11/2023)   PRAPARE - Administrator, Civil Service (Medical): No    Lack of Transportation (Non-Medical): No  Physical Activity: Inactive (09/11/2023)   Exercise Vital Sign    Days of Exercise per Week: 0 days    Minutes of Exercise per Session: 0 min  Stress: No Stress Concern Present (09/11/2023)   [employer] of Occupational Health - Occupational Stress Questionnaire    Feeling of Stress: Only a little  Social Connections: Unknown (09/11/2023)   Social Connection and Isolation Panel    Frequency of Communication with Friends and Family: Twice a week    Frequency  of Social Gatherings with Friends and Family: Once a week    Attends Religious Services: Never    Database administrator or Organizations: Yes    Attends Engineer, structural: More than 4 times per year    Marital Status: Patient declined    Tobacco Counseling Counseling given: Not Answered    Clinical Intake:  Pre-visit preparation completed: Yes        BMI - recorded: 48.87 Nutritional Risks: Other (Comment) (Pt on Mounjaro ) Diabetes: Yes CBG done?: No Did pt. bring in CBG monitor from home?: No  Lab Results  Component Value Date   HGBA1C 7.6 (H) 06/29/2023   HGBA1C 7.4 (H) 04/02/2023   HGBA1C 7.5 (H) 12/25/2022     How often do you need to have someone help you when you read instructions, pamphlets, or other written materials from your doctor or pharmacy?: 1 - Never What is the last grade level you completed in school?: Bachelor's degree  Interpreter Needed?: No  Information entered by :: Lolita Libra, CMA   Activities of Daily Living     09/11/2023    1:52 PM  In your present state of health, do you have any difficulty performing the following activities:  Hearing? 1  Comment wears bilateral hearings  Vision? 0  Difficulty concentrating or making decisions? 0  Walking or climbing stairs? 0  Dressing or bathing?  0  Doing errands, shopping? 0  Preparing Food and eating ? N  Using the Toilet? N  In the past six months, have you accidently leaked urine? Y  Comment takes fluid pill, doesn't feel it is serious enough to talk with PCP.  Do you have problems with loss of bowel control? N  Managing your Medications? N  Managing your Finances? N  Housekeeping or managing your Housekeeping? N    Patient Care Team: Jason Leita Repine, FNP as PCP - General (Internal Medicine) Alvia Norleen BIRCH, MD as Consulting Physician (Ophthalmology) Lyn Loving, PhD as Consulting Physician (Psychology) Pa, [facility]  I have updated your Care Teams any recent Medical Services you may have received from other providers in the past year.     Assessment:   This is a routine wellness examination for Lequan.  Hearing/Vision screen Hearing Screening - Comments:: Wears bilateral hearing aids Vision Screening - Comments:: Eye exam 08/16/23 w/ [facility].    Goals Addressed   None    Depression Screen     09/11/2023    1:58 PM 12/26/2022    9:10 AM 09/06/2022    1:57 PM 05/05/2022   10:18 AM 09/05/2021    9:08 AM 09/30/2020   10:55 AM  PHQ 2/9 Scores  PHQ - 2 Score 0 0 0 0 0 0  PHQ- 9 Score 4  0       Fall Risk     09/11/2023    1:52 PM 12/26/2022    9:10 AM 09/05/2022    6:31 PM 05/05/2022   10:18 AM 12/22/2021    9:07 AM  Fall Risk   Falls in the past year? 0 0 1 1 0  Number falls in past yr: 0 0 1 0 0  Injury with Fall? 0 0 0 0 0  Risk for fall due to : Impaired mobility;Impaired vision No Fall Risks History of fall(s);Impaired mobility;Impaired vision;Orthopedic patient History of fall(s) No Fall Risks  Follow up Falls evaluation completed Falls evaluation completed Falls evaluation completed;Education provided;Falls prevention discussed Falls evaluation completed Falls evaluation  completed      Data saved with a previous flowsheet row definition    MEDICARE RISK AT HOME:  Medicare  Risk at Home Any stairs in or around the home?: No If so, are there any without handrails?: No Home free of loose throw rugs in walkways, pet beds, electrical cords, etc?: Yes Adequate lighting in your home to reduce risk of falls?: Yes Life alert?: No Use of a cane, walker or w/c?: No (Has cane and walker but doesn't usually need them.) Grab bars in the bathroom?: Yes Shower chair or bench in shower?: No Elevated toilet seat or a handicapped toilet?: No  TIMED UP AND GO:  Was the test performed?  No,audio  Cognitive Function: 6CIT completed        09/11/2023    2:03 PM 09/06/2022    2:03 PM  6CIT Screen  What Year? 0 points 0 points  What month? 0 points 0 points  What time? 0 points 0 points  Count back from 20 0 points 0 points  Months in reverse 0 points 0 points  Repeat phrase 0 points 0 points  Total Score 0 points 0 points    Immunizations Immunization History  Administered Date(s) Administered   Fluad Quad(high Dose 65+) 12/17/2018, 12/10/2020, 12/22/2021   Fluad Trivalent(High Dose 65+) 12/26/2022   Influenza, High Dose Seasonal PF 11/25/2015, 12/14/2016, 12/17/2018, 12/13/2019, 12/10/2020   Influenza,inj,Quad PF,6+ Mos 12/05/2012, 11/26/2013, 11/11/2014, 11/02/2017   Novavax(Covid-19) Vaccine 11/25/2022   PFIZER(Purple Top)SARS-COV-2 Vaccination 04/10/2019, 05/05/2019, 12/15/2019   PNEUMOCOCCAL CONJUGATE-20 07/03/2020, 06/21/2021   Pfizer Covid-19 Vaccine Bivalent Booster 76yrs & up 11/02/2020   Pneumococcal Polysaccharide-23 01/21/2020   Tdap 02/25/2014   Unspecified SARS-COV-2 Vaccination 11/21/2022   Zoster Recombinant(Shingrix ) 01/31/2019, 08/07/2019    Screening Tests Health Maintenance  Topic Date Due   Hepatitis C Screening  Never done   COVID-19 Vaccine (6 - 2024-25 season) 05/25/2023   INFLUENZA VACCINE  09/28/2023   FOOT EXAM  09/29/2023   HEMOGLOBIN A1C  12/30/2023   DTaP/Tdap/Td (2 - Td or Tdap) 02/26/2024   Diabetic kidney evaluation -  eGFR measurement  06/28/2024   Diabetic kidney evaluation - Urine ACR  08/08/2024   OPHTHALMOLOGY EXAM  08/15/2024   Medicare Annual Wellness (AWV)  09/10/2024   Colonoscopy  12/04/2029   Pneumococcal Vaccine: 50+ Years  Completed   Zoster Vaccines- Shingrix   Completed   Hepatitis B Vaccines  Aged Out   HPV VACCINES  Aged Out   Meningococcal B Vaccine  Aged Out    Health Maintenance  Health Maintenance Due  Topic Date Due   Hepatitis C Screening  Never done   COVID-19 Vaccine (6 - 2024-25 season) 05/25/2023   Health Maintenance Items Addressed: Will consider Hep C screening at next OV with PCP. Will get foot exam with endo in a couple of weeks.  Additional Screening:  Vision Screening: Recommended annual ophthalmology exams for early detection of glaucoma and other disorders of the eye. Would you like a referral to an eye doctor? No    Dental Screening: Recommended annual dental exams for proper oral hygiene  Community Resource Referral / Chronic Care Management: CRR required this visit?  No   CCM required this visit?  No   Plan:    I have personally reviewed and noted the following in the patient's chart:   Medical and social history Use of alcohol, tobacco or illicit drugs  Current medications and supplements including opioid prescriptions. Patient is not currently taking opioid prescriptions.  Functional ability and status Nutritional status Physical activity Advanced directives List of other physicians Hospitalizations, surgeries, and ER visits in previous 12 months Vitals Screenings to include cognitive, depression, and falls Referrals and appointments  In addition, I have reviewed and discussed with patient certain preventive protocols, quality metrics, and best practice recommendations. A written personalized care plan for preventive services as well as general preventive health recommendations were provided to patient.   Lolita Libra, CMA   09/11/2023    After Visit Summary: (MyChart) Due to this being a telephonic visit, the after visit summary with patients personalized plan was offered to patient via MyChart   Notes: Nothing significant to report at this time.

## 2023-09-11 NOTE — Patient Instructions (Addendum)
 Paul Adkins , Thank you for taking time out of your busy schedule to complete your Annual Wellness Visit with me. I enjoyed our conversation and look forward to speaking with you again next year. I, as well as your care team,  appreciate your ongoing commitment to your health goals. Please review the following plan we discussed and let me know if I can assist you in the future. Your Game plan/ To Do List    Follow up Visits: Next Medicare AWV with our clinical staff: 09/11/24 1:40pm    Next Office Visit with your provider: 12/28/23 8:40am. You can get your hepatitis C screening blood test at this visit.  Clinician Recommendations:  Aim for 30 minutes of exercise or brisk walking, 6-8 glasses of water, and 5 servings of fruits and vegetables each day.   Please get your diabetic foot exam at your next appointment as it will be due in August.      This is a list of the screening recommended for you and due dates:  Health Maintenance  Topic Date Due   Hepatitis C Screening  Never done   COVID-19 Vaccine (6 - 2024-25 season) 05/25/2023   Flu Shot  09/28/2023   Complete foot exam   09/29/2023   Hemoglobin A1C  12/30/2023   DTaP/Tdap/Td vaccine (2 - Td or Tdap) 02/26/2024   Yearly kidney function blood test for diabetes  06/28/2024   Yearly kidney health urinalysis for diabetes  08/08/2024   Eye exam for diabetics  08/15/2024   Medicare Annual Wellness Visit  09/10/2024   Colon Cancer Screening  12/04/2029   Pneumococcal Vaccine for age over 31  Completed   Zoster (Shingles) Vaccine  Completed   Hepatitis B Vaccine  Aged Out   HPV Vaccine  Aged Out   Meningitis B Vaccine  Aged Out    You can return a copy of your Advanced Directives by either of the following: Advanced directives: (Copy Requested) Please bring a copy of your health care power of attorney and living will to the office to be added to your chart at your convenience. You can mail to [facility] [address]  [address] [place], [place] [address] or email to [email or web address] Advance Care Planning is important because it:  [x]  Makes sure you receive the medical care that is consistent with your values, goals, and preferences  [x]  It provides guidance to your family and loved ones and reduces their decisional burden about whether or not they are making the right decisions based on your wishes.  Follow the link provided in your after visit summary or read over the paperwork we have mailed to you to help you started getting your Advance Directives in place. If you need assistance in completing these, please reach out to us  so that we can help you!  See attachments for Preventive Care and Fall Prevention Tips.

## 2023-09-13 ENCOUNTER — Other Ambulatory Visit: Payer: Self-pay | Admitting: Family

## 2023-10-05 ENCOUNTER — Other Ambulatory Visit

## 2023-10-05 DIAGNOSIS — Z5181 Encounter for therapeutic drug level monitoring: Secondary | ICD-10-CM | POA: Diagnosis not present

## 2023-10-05 DIAGNOSIS — E291 Testicular hypofunction: Secondary | ICD-10-CM | POA: Diagnosis not present

## 2023-10-05 DIAGNOSIS — E1065 Type 1 diabetes mellitus with hyperglycemia: Secondary | ICD-10-CM | POA: Diagnosis not present

## 2023-10-06 LAB — AST: AST: 16 U/L (ref 10–35)

## 2023-10-06 LAB — PSA: PSA: 0.49 ng/mL (ref <=4.00)

## 2023-10-06 LAB — BASIC METABOLIC PANEL WITHOUT GFR
BUN/Creatinine Ratio: 15 (calc) (ref 6–22)
BUN: 25 mg/dL (ref 7–25)
CO2: 28 mmol/L (ref 20–32)
Calcium: 8.7 mg/dL (ref 8.6–10.3)
Chloride: 107 mmol/L (ref 98–110)
Creat: 1.65 mg/dL — ABNORMAL HIGH (ref 0.70–1.28)
Glucose, Bld: 138 mg/dL — ABNORMAL HIGH (ref 65–99)
Potassium: 5.3 mmol/L (ref 3.5–5.3)
Sodium: 140 mmol/L (ref 135–146)

## 2023-10-06 LAB — HEMOGLOBIN A1C
Hgb A1c MFr Bld: 7.1 % — ABNORMAL HIGH (ref <5.7)
Mean Plasma Glucose: 157 mg/dL
eAG (mmol/L): 8.7 mmol/L

## 2023-10-06 LAB — MICROALBUMIN / CREATININE URINE RATIO
Creatinine, Urine: 163 mg/dL (ref 20–320)
Microalb Creat Ratio: 6 mg/g{creat} (ref <30)
Microalb, Ur: 0.9 mg/dL

## 2023-10-06 LAB — HEMATOCRIT: HCT: 40.9 % (ref 38.5–50.0)

## 2023-10-08 ENCOUNTER — Ambulatory Visit: Payer: Self-pay | Admitting: Endocrinology

## 2023-10-10 ENCOUNTER — Ambulatory Visit: Admitting: Endocrinology

## 2023-10-10 ENCOUNTER — Encounter: Payer: Self-pay | Admitting: Endocrinology

## 2023-10-10 VITALS — BP 138/60 | HR 68 | Resp 20 | Ht 67.0 in | Wt 308.0 lb

## 2023-10-10 DIAGNOSIS — E1065 Type 1 diabetes mellitus with hyperglycemia: Secondary | ICD-10-CM | POA: Diagnosis not present

## 2023-10-10 DIAGNOSIS — G4733 Obstructive sleep apnea (adult) (pediatric): Secondary | ICD-10-CM

## 2023-10-10 DIAGNOSIS — E88819 Insulin resistance, unspecified: Secondary | ICD-10-CM

## 2023-10-10 DIAGNOSIS — E291 Testicular hypofunction: Secondary | ICD-10-CM

## 2023-10-10 DIAGNOSIS — E7800 Pure hypercholesterolemia, unspecified: Secondary | ICD-10-CM

## 2023-10-10 MED ORDER — TIRZEPATIDE 7.5 MG/0.5ML ~~LOC~~ SOAJ: 7.5000 mg | SUBCUTANEOUS | 4 refills | Status: DC

## 2023-10-10 NOTE — Progress Notes (Signed)
 Outpatient Endocrinology Note Paul Juanangel Soderholm, MD  10/10/23  Patient's Name: Paul Adkins    DOB: Mar 10, 1945    MRN: [id number]                                                    REASON OF VISIT: Follow up of type 1 diabetes mellitus Paul Adkins  PCP: Jason Leita Repine, FNP  HISTORY OF PRESENT ILLNESS:   Paul Adkins is a 78 y.o. old male with past medical history listed below, is here for follow up of type 1 diabetes mellitus and hypogonadism.    Pertinent Diabetes History: Patient was previously seen by Dr. Von and was last time seen in August 2024. Patient was diagnosed with type 1 diabetes mellitus in 1978.  He has been on insulin  pump therapy.  Chronic Diabetes Complications : Retinopathy: yes. Last ophthalmology exam was done on annually, following with ophthalmology regularly, Dr. Will.   Nephropathy: CKD IIIb, on ACE/ARB / olmesartan , following with nephrology. Peripheral neuropathy: no Coronary artery disease: on Stroke: yes  Relevant comorbidities and cardiovascular risk factors: Obesity: yes Body mass index is 48.24 kg/m.  Hypertension: Yes  Hyperlipidemia : Yes, on statin.   Current / Home Diabetic regimen includes:  Mounjaro  5 mg weekly.  Mounjaro  was started in May 2025.  Also indicated for obstructive sleep apnea and insulin  resistance.   Tandem t:slim insulin  pump on control IQ with Dexcom G6.  Using Humalog  U200.  Insulin  Pump setting:  Basal ( total 35.1) MN- 1.40u/hour 4AM- 1.350  8AM- 1.450 10AM- 1.450 6PM-   1.6  Bolus CHO Ratio (1unit:CHO) MN- 1:7  Correction/Sensitivity: MN- 1:28  Target: 110  Active insulin  time: 5 hours  Prior diabetic medications: He had tried in invokana  in the past. Ozempic  in the past, later stopped not sure about side effect, patient does not want to go back on it.   Started on Humalog  U200 on the pump from May 2025.  CONTINUOUS GLUCOSE MONITORING SYSTEM (CGMS) / INSULIN  PUMP INTERPRETATION:                          Tandem Pump & Sensor Download (Reviewed and summarized below.) Pump: Dexcom G6 and Tandem t:slim Dates: July 31 to October 10, 2023, 14 days  Total daily dose 65,  Basal 51%, bolus 49%, manual bolus 66%, control IQ bolus 34%.  Average daily carbs 97 g. GMI 6.9%, average blood sugar 151, time in range 81%, CGM use 99%.  Control IQ 99%.  Using Humalog  U200.     Trends:  Mostly acceptable blood sugar.  Intermittent mild hyperglycemia with blood sugar 180-200 range mostly related to late meal bolus and missing meal bolus however giving manual boluses for hyperglycemia.  No concerning hyperglycemia.  No hypoglycemia.  Hypoglycemia: Patient has no hypoglycemic episodes. Patient has hypoglycemia awareness.    Factors modifying glucose control: 1.  Diabetic diet assessment: 3 meals a day.  2.  Staying active or exercising: No formal exercise.  3.  Medication compliance: compliant most of the time.  # Hypogonadism : - He has had hypogonadotropic hypogonadism, he has been on testosterone  supplementation since about 2007. Had been on AndroGel  1.62% pump.  Currently on Jatenzo  237 mg twice a day  Interval history  Insulin  pump and CGM data as reviewed above.  Mostly acceptable blood sugar.  Hemoglobin A1c improved to 7.1%.  He has been taking Mounjaro  5 mg weekly, tolerating well.  He has occasional constipation otherwise no new GI issues.  He has required less insulin  by an average of 5 units/day after being on Mounjaro .  He has not lost weight.  No numbness and ting of the feet.  He has no other complaints today.  Patient laboratory results reviewed normal hematocrit, PSA, liver enzyme.  Stable renal function and normal electrolytes.  Normal urine microalbumin creatinine ratio.  REVIEW OF SYSTEMS As per history of present illness.   PAST MEDICAL HISTORY: Past Medical History:  Diagnosis Date   Carpal tunnel syndrome    Chronic kidney disease    Depression     Diabetes mellitus without complication (HCC)    ED (erectile dysfunction)    Edema    Hyperlipidemia    Hypertension    Lumbar disc disease    Neuropathy    Sleep apnea    Stroke (HCC)     PAST SURGICAL HISTORY: Past Surgical History:  Procedure Laterality Date   BACK SURGERY     VASECTOMY      ALLERGIES: No Known Allergies  FAMILY HISTORY:  Family History  Problem Relation Age of Onset   Osteoarthritis Mother    Cancer Father        Prostate   Cancer Brother    Drug abuse Brother    Diabetes Neg Hx     SOCIAL HISTORY: Social History   Socioeconomic History   Marital status: Single    Spouse name: Not on file   Number of children: Not on file   Years of education: Not on file   Highest education level: Not on file  Occupational History   Occupation: retired    Comment: Doctor, 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, Music  Tobacco Use   Smoking status: Never    Passive exposure: Never   Smokeless tobacco: Never  Vaping Use   Vaping status: Never Used  Substance and Sexual Activity   Alcohol use: No   Drug use: No   Sexual activity: Not on file  Other Topics Concern   Not on file  Social History Narrative   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 [employer] Strain: Low Risk  (09/11/2023)   Overall Financial Resource Strain (CARDIA)    Difficulty of Paying Living Expenses: Not hard at all  Food Insecurity: No Food Insecurity (09/11/2023)   Hunger Vital Sign    Worried About Running Out of Food in the Last Year: Never true    Ran Out of Food in the Last Year: Never true  Transportation Needs: No Transportation Needs (09/11/2023)   PRAPARE - Administrator, Civil Service (Medical): No    Lack of Transportation (Non-Medical): No  Physical Activity: Inactive (09/11/2023)   Exercise Vital Sign    Days of Exercise per Week: 0 days    Minutes of Exercise per Session: 0 min  Stress: No Stress Concern Present  (09/11/2023)   [employer] of Occupational Health - Occupational Stress Questionnaire    Feeling of Stress: Only a little  Social Connections: Unknown (09/11/2023)   Social Connection and Isolation Panel    Frequency of Communication with Friends and Family: Twice a week    Frequency of Social Gatherings with Friends and Family: Once a week    Attends Religious Services: Never    Active Member  of Clubs or Organizations: Yes    Attends Banker Meetings: More than 4 times per year    Marital Status: Patient declined    MEDICATIONS:  Current Outpatient Medications  Medication Sig Dispense Refill   tirzepatide  (MOUNJARO ) 7.5 MG/0.5ML Pen Inject 7.5 mg into the skin once a week. 6 mL 4   atorvastatin  (LIPITOR) 20 MG tablet TAKE 1 TABLET BY MOUTH EVERY DAY 90 tablet 1   carvedilol (COREG) 6.25 MG tablet Take 6.25 mg by mouth 2 (two) times daily with a meal.     furosemide  (LASIX ) 40 MG tablet TAKE 1 TABLET BY MOUTH TWICE A DAY AS NEEDED 180 tablet 0   glucose blood (BAYER CONTOUR NEXT TEST) test strip Use as instructed to check blood sugars 4 times per day dx code E10.65 150 each 5   Insulin  Infusion Pump (T:SLIM X2 CONTROL-IQ 7.8 PUMP) DEVI by Does not apply route.     insulin  lispro (HUMALOG  KWIKPEN) 200 UNIT/ML KwikPen Take up to 180 units/ day via insulin  pump. 30 mL 3   methylphenidate  (RITALIN ) 10 MG tablet Take 1 tablet up to twice a day 60 tablet 0   Multiple Vitamins-Minerals (EYE HEALTH AREDS 2 PO) Take 2 capsules by mouth daily.     olmesartan  (BENICAR ) 40 MG tablet TAKE 1 TABLET BY MOUTH EVERY DAY 90 tablet 2   PARoxetine  (PAXIL ) 40 MG tablet Take 1 tablet (40 mg total) by mouth daily. 90 tablet 3   Polyethylene Glycol 3350 (MIRALAX PO) Take by mouth every other day.     spironolactone (ALDACTONE) 50 MG tablet Take 50 mg by mouth daily.     Testosterone  Undecanoate (JATENZO ) 237 MG CAPS 1 CAPSULE WITH BREAKFAST AND 1 WITH DINNER 60 capsule 5   VITAMIN D ,  CHOLECALCIFEROL, PO Take 6,000 Int'l Units/day by mouth.     No current facility-administered medications for this visit.    PHYSICAL EXAM: Vitals:   10/10/23 0937  BP: 138/60  Pulse: 68  Resp: 20  SpO2: 97%  Weight: (!) 308 lb (139.7 kg)  Height: 5' 7 (1.702 m)     Body mass index is 48.24 kg/m.  Wt Readings from Last 3 Encounters:  10/10/23 (!) 308 lb (139.7 kg)  09/11/23 (!) 312 lb (141.5 kg)  04/06/23 (!) 308 lb (139.7 kg)    General: Well developed, well nourished male in no apparent distress.  HEENT: AT/[place], no external lesions.  Eyes: Conjunctiva clear and no icterus. Neck: Neck supple  Lungs: Respirations not labored Neurologic: Alert, oriented, normal speech Extremities / Skin: Dry. Trace b/l pedal edema.  Psychiatric: Does not appear depressed or anxious  Diabetic Foot Exam - Simple   Simple Foot Form Diabetic Foot exam was performed with the following findings: Yes 10/10/2023 10:28 AM  Visual Inspection No deformities, no ulcerations, no other skin breakdown bilaterally: Yes Sensation Testing Intact to touch and monofilament testing bilaterally: Yes Pulse Check Posterior Tibialis and Dorsalis pulse intact bilaterally: Yes Comments DP 1 + bilaterally.      LABS Reviewed Lab Results  Component Value Date   HGBA1C 7.1 (H) 10/05/2023   HGBA1C 7.6 (H) 06/29/2023   HGBA1C 7.4 (H) 04/02/2023   Lab Results  Component Value Date   FRUCTOSAMINE 219 09/25/2022   FRUCTOSAMINE 256 08/02/2015   Lab Results  Component Value Date   CHOL 151 04/02/2023   HDL 31 (L) 04/02/2023   LDLCALC 87 04/02/2023   LDLDIRECT 67.0 08/02/2015   TRIG 241 (H) 04/02/2023  CHOLHDL 4.9 04/02/2023   Lab Results  Component Value Date   MICRALBCREAT 6 10/05/2023   MICRALBCREAT 6 08/09/2023   Lab Results  Component Value Date   CREATININE 1.65 (H) 10/05/2023   Lab Results  Component Value Date   GFR 40.50 (L) 12/25/2022    ASSESSMENT / PLAN  1. Uncontrolled type 1  diabetes mellitus with hyperglycemia (HCC)   2. Morbid obesity (HCC)   3. Insulin  resistance   4. Obstructive sleep apnea   5. Hypercholesterolemia   6. Hypogonadism male      Diabetes Mellitus type 1, complicated by diabetic retinopathy/CKD. - Diabetic status / severity: Uncontrolled.  Improving.  Lab Results  Component Value Date   HGBA1C 7.1 (H) 10/05/2023   Patient has high insulin  resistance due to obesity.  - Hemoglobin A1c goal <6.5%   Patient has high insulin  resistance, requiring insulin  close to 1 unit /kg body weight.  He will benefit from GLP-1 receptor agonist to help with insulin  sensitivity and also promote weight loss.  This will help to require less amount of insulin .  Mounjaro  was started in May 2025 followed with indication of obstructive sleep apnea and obesity.  It was covered and was able to get it.  Currently taking 5 mg weekly without any issues.  He has required less insulin  and also diabetes control has improved after being on Mounjaro .   - Medications:  Insulin  pump setting changed as follows:  Tandem t:slim insulin  pump on control IQ with Dexcom G6.  Using Humalog  U200.  No change on the pump setting today.  Increase Mounjaro  from 5 to 7.5 mg weekly.   - Home glucose testing: continue CGM and check blood glucose as needed.   - Discussed/ Gave Hypoglycemia treatment plan.  # Consult : not required at this time.   # Annual urine for microalbuminuria/ creatinine ratio, no microalbuminuria currently, continue ACE/ARB /olmesartan  / he has CKD, following with nephrology. Last  Lab Results  Component Value Date   MICRALBCREAT 6 10/05/2023    # Foot check nightly.  # Annual dilated diabetic eye exams.   - Diet: Make healthy diabetic food choices - Life style / activity / exercise: Discussed.  Not able to exercise.  2. Blood pressure  -  BP Readings from Last 1 Encounters:  10/10/23 138/60    - Control is on target.  - No change in current  plans.  3. Lipid status / Hyperlipidemia - Last  Lab Results  Component Value Date   LDLCALC 87 04/02/2023   - Continue atorvastatin  20 mg daily.    # Hypogonadism: -Continue Jatenzo  237 mg 2 times a day with meals. - Check total and free testosterone  with next set of lab, patient is asked to do lab around noon time and make sure to take Jatenzo  in the morning.  Diagnoses and all orders for this visit:  Uncontrolled type 1 diabetes mellitus with hyperglycemia (HCC) -     tirzepatide  (MOUNJARO ) 7.5 MG/0.5ML Pen; Inject 7.5 mg into the skin once a week. -     Basic metabolic panel with GFR  Morbid obesity (HCC) -     tirzepatide  (MOUNJARO ) 7.5 MG/0.5ML Pen; Inject 7.5 mg into the skin once a week.  Insulin  resistance -     tirzepatide  (MOUNJARO ) 7.5 MG/0.5ML Pen; Inject 7.5 mg into the skin once a week.  Obstructive sleep apnea -     tirzepatide  (MOUNJARO ) 7.5 MG/0.5ML Pen; Inject 7.5 mg into the skin once a week.  Hypercholesterolemia -     Lipid panel  Hypogonadism male -     Testosterone  , Free and Total    DISPOSITION Follow up in clinic in 3 months suggested.  Labs prior to follow-up visit as ordered.   All questions answered and patient verbalized understanding of the plan.  Paul Khai Arrona, MD [facility] Endocrinology [facility] [address] [place], [address] [place], [place] [address] Phone # [phone]  At least part of this note was generated using voice recognition software. Inadvertent word errors may have occurred, which were not recognized during the proofreading process.

## 2023-10-19 ENCOUNTER — Telehealth: Payer: Self-pay | Admitting: Family

## 2023-10-19 NOTE — Telephone Encounter (Signed)
**Note De-identified  [place] Obfuscation** Please advise 

## 2023-10-19 NOTE — Telephone Encounter (Signed)
 Copied from CRM #[id number]. Topic: Appointments - Scheduling Inquiry for Clinic >> Oct 19, 2023 10:57 AM Berneda FALCON wrote: Reason for CRM: Patient was a Dr. Jason patient but she is leaving and patient would like to know if Dr. Amon would take him on as a new patient considering the circumstance.  Patient callback is [phone]

## 2023-10-19 NOTE — Telephone Encounter (Signed)
 Okay to schedule per Dr. Amon.

## 2023-10-19 NOTE — Telephone Encounter (Signed)
 Is okay

## 2023-10-31 ENCOUNTER — Other Ambulatory Visit: Payer: Self-pay | Admitting: Endocrinology

## 2023-11-05 ENCOUNTER — Other Ambulatory Visit: Payer: Self-pay | Admitting: Endocrinology

## 2023-11-05 DIAGNOSIS — E1065 Type 1 diabetes mellitus with hyperglycemia: Secondary | ICD-10-CM

## 2023-11-05 NOTE — Telephone Encounter (Signed)
 Refill request complete

## 2023-12-19 DIAGNOSIS — M48061 Spinal stenosis, lumbar region without neurogenic claudication: Secondary | ICD-10-CM | POA: Insufficient documentation

## 2023-12-19 DIAGNOSIS — N189 Chronic kidney disease, unspecified: Secondary | ICD-10-CM | POA: Insufficient documentation

## 2023-12-28 ENCOUNTER — Encounter: Payer: Medicare PPO | Admitting: Family

## 2023-12-28 ENCOUNTER — Encounter: Payer: Self-pay | Admitting: Internal Medicine

## 2023-12-28 ENCOUNTER — Ambulatory Visit: Admitting: Internal Medicine

## 2023-12-28 DIAGNOSIS — Z23 Encounter for immunization: Secondary | ICD-10-CM | POA: Diagnosis not present

## 2023-12-28 DIAGNOSIS — G4733 Obstructive sleep apnea (adult) (pediatric): Secondary | ICD-10-CM | POA: Diagnosis not present

## 2023-12-28 DIAGNOSIS — N1832 Chronic kidney disease, stage 3b: Secondary | ICD-10-CM | POA: Insufficient documentation

## 2023-12-28 DIAGNOSIS — M47896 Other spondylosis, lumbar region: Secondary | ICD-10-CM

## 2023-12-28 DIAGNOSIS — E108 Type 1 diabetes mellitus with unspecified complications: Secondary | ICD-10-CM

## 2023-12-28 MED ORDER — MAGNESIUM OXIDE 400 MG PO TABS: 400.0000 mg | ORAL_TABLET | Freq: Every day | ORAL | Status: AC

## 2023-12-28 NOTE — Patient Instructions (Addendum)
 go to the front desk for the checkout Please make an appointment for a checkup in 4 months   STOP BY THE FIRST FLOOR:  get the XR    Check the  blood pressure regularly Blood pressure goal:  between 110/65 and  135/85. If it is consistently higher or lower, let me know    CHRONIC LOW BACK PAIN WITH LUMBAR FUSION HISTORY AND RIGHT LEG PAIN AND NUMBNESS: You have chronic low back pain and right leg pain and numbness, likely related to your lumbar spine issues. -Consider using Tylenol for pain management, but be aware it may interfere with your continuous glucose monitor (CGM). -Avoid using ibuprofen due to your chronic kidney disease (CKD). -Consult with neurosurgeon Dr. Mavis if your pain worsens. -Continue working on weight loss to improve your mobility and reduce back pain. -Continue taking Mounjaro  and discuss a potential dose adjustment with Dr. Rosan.  CHRONIC KIDNEY DISEASE: Your chronic kidney disease is managed with Aldactone, Benicar , Lasix , and carvedilol. -Continue taking your current medications for CKD. -Discuss your magnesium supplementation with your nephrologist.  HYPERLIPIDEMIA: Your hyperlipidemia is managed with Lipitor. -Continue taking Lipitor.  OBSTRUCTIVE SLEEP APNEA: Your obstructive sleep apnea is managed with CPAP. -Continue using your CPAP machine.   ENCOUNTER FOR IMMUNIZATION: You require a flu shot for seasonal influenza prevention. -You received your flu shot today.

## 2023-12-28 NOTE — Progress Notes (Signed)
 Subjective:    Patient ID: Paul Adkins, male    DOB: 03/12/1945, 78 y.o.   MRN: [id number]  DOS:  12/28/2023 Transfer of care, first visit with me Discussed the use of AI scribe software for clinical note transcription with the patient, who gave verbal consent to proceed.  History of Present Illness   New patient to me, I reviewed the chart to get familiar with the patient's conditions.  His main concern is R leg pain.  - Intermittent leg pain, primarily affecting the right leg - Pain severity sufficient to prevent ambulation at times - Required walker for one week approximately one month ago; currently uses a cane for balance - Pain occurs even while lying in bed - Gabapentin provides relief, but he is hesitant to use it regularly - Numbness present on the lateral side of the R tight, not involving the feet - No burning sensation in the feet - No recent falls  Chronic lower back pain and surgical history - History of lumbar fusion at two levels   performed ~ 20 years ago - Neurosurgical recommendation for further fusion at L2-L3 made six months ago; not ready to proceed with surgery  Diabetes mellitus management - Type 1 diabetes managed with insulin  and Mounjaro  - Initiated Mounjaro  six months ago, resulting in weight loss of three to four pounds  Other chronic medical conditions and therapies - Hyperlipidemia managed with Lipitor - Obstructive sleep apnea managed with CPAP - Current medications include testosterone , Aldactone, Benicar , Lasix , Paxil , vitamin D , magnesium, spironolactone, and carvedilol     Review of Systems See above   Past Medical History:  Diagnosis Date   Carpal tunnel syndrome    Chronic kidney disease    Depression    Diabetes mellitus without complication (HCC)    ED (erectile dysfunction)    Edema    Hyperlipidemia    Hypertension    Lumbar disc disease    Neuropathy    Sleep apnea    Stroke [facility])     Past Surgical History:   Procedure Laterality Date   BACK SURGERY  2005   fusion L4-S1   VASECTOMY     Social History   Socioeconomic History   Marital status: Single    Spouse name: Not on file   Number of children: 0   Years of education: Not on file   Highest education level: Doctorate  Occupational History   Occupation: retired, music professor [employer]    Comment: University Professor, Music  Tobacco Use   Smoking status: Never    Passive exposure: Never   Smokeless tobacco: Never  Vaping Use   Vaping status: Never Used  Substance and Sexual Activity   Alcohol use: No   Drug use: No   Sexual activity: Not on file  Other Topics Concern   Not on file  Social History Narrative   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 [employer] Strain: Low Risk  (12/21/2023)   Overall Financial Resource Strain (CARDIA)    Difficulty of Paying Living Expenses: Not very hard  Food Insecurity: No Food Insecurity (12/21/2023)   Hunger Vital Sign    Worried About Running Out of Food in the Last Year: Never true    Ran Out of Food in the Last Year: Never true  Transportation Needs: No Transportation Needs (12/21/2023)   PRAPARE - Administrator, Civil Service (Medical): No  Lack of Transportation (Non-Medical): No  Physical Activity: Inactive (12/21/2023)   Exercise Vital Sign    Days of Exercise per Week: 0 days    Minutes of Exercise per Session: Not on file  Stress: No Stress Concern Present (12/21/2023)   [employer] of Occupational Health - Occupational Stress Questionnaire    Feeling of Stress: Not at all  Social Connections: Moderately Isolated (12/21/2023)   Social Connection and Isolation Panel    Frequency of Communication with Friends and Family: Twice a week    Frequency of Social Gatherings with Friends and Family: Once a week    Attends Religious Services: Never    Database Administrator or Organizations: Yes     Attends Engineer, Structural: More than 4 times per year    Marital Status: Divorced  Intimate Partner Violence: Not At Risk (09/11/2023)   Humiliation, Afraid, Rape, and Kick questionnaire    Fear of Current or Ex-Partner: No    Emotionally Abused: No    Physically Abused: No    Sexually Abused: No    Current Outpatient Medications  Medication Instructions   atorvastatin  (LIPITOR) 20 mg, Oral, Daily   carvedilol (COREG) 6.25 mg, 2 times daily with meals   furosemide  (LASIX ) 40 mg, Oral, 2 times daily PRN   glucose blood (BAYER CONTOUR NEXT TEST) test strip Use as instructed to check blood sugars 4 times per day dx code E10.65   Insulin  Infusion Pump (T:SLIM X2 CONTROL-IQ 7.8 PUMP) DEVI by Does not apply route.   insulin  lispro (HUMALOG  KWIKPEN) 200 UNIT/ML KwikPen TAKE UP TO 180 UNITS/ DAY VIA INSULIN  PUMP.   magnesium oxide (MAG-OX) 400 mg, Oral, Daily   Multiple Vitamins-Minerals (EYE HEALTH AREDS 2 PO) 2 capsules, Daily   olmesartan  (BENICAR ) 40 mg, Oral, Daily   PARoxetine  (PAXIL ) 40 mg, Oral, Daily   Polyethylene Glycol 3350 (MIRALAX PO) Every other day   spironolactone (ALDACTONE) 50 mg, Daily   Testosterone  Undecanoate (JATENZO ) 237 MG CAPS 1 CAPSULE WITH BREAKFAST AND 1 WITH DINNER   tirzepatide  (MOUNJARO ) 7.5 mg, Subcutaneous, Weekly   VITAMIN D , CHOLECALCIFEROL, PO 6,000 Int'l Units/day       Objective:   Physical Exam BP 126/72   Pulse 71   Temp 98 F (36.7 C) (Oral)   Resp 18   Ht 5' 7 (1.702 m)   Wt (!) 301 lb 4 oz (136.6 kg)   SpO2 95%   BMI 47.18 kg/m  General:   Well developed, NAD, BMI noted. HEENT:  Normocephalic . Face symmetric, atraumatic Lungs:  CTA B Normal respiratory effort, no intercostal retractions, no accessory muscle use. Heart: RRR,  no murmur.  Lower extremities: Trace pretibial edema bilaterally  Skin: Not pale. Not jaundice Neurologic:  alert & oriented X3.  Speech normal, gait and transfer: Very limited, mostly due to  BMI, uses a cane Motor symmetric, DTR symmetric. Psych--  Cognition and judgment appear intact.  Cooperative with normal attention span and concentration.  Behavior appropriate. No anxious or depressed appearing.      Assessment    Problem list (transferred to my care 11-2023) Type 1 diabetes (CKD-retinopathy) CKD, GFR ~ 40, sees nephrology Insulin  resistant High cholesterol Hypogonadism Morbid obesity OSA-on CPAP Vitamin D  deficiency Depression History of HDD    Assessment & Plan Chronic low back pain , lumbar fusion history and right leg pain   Saw neurosurgery Dr. Mavis 10/03/2022.  Recent MRI show a good decompression of L4-5 and L5-S1, was diagnosed  with lumbar facet arthropathy, lumbar spinal stenosis, neurogenic bladder medication, spondylolisthesis. Most symptoms are likely coming from a L3-4 narrowing. Surgery was discussed, patient is not inclined to proceed and was told to call him back when ready. At this point the patient continue with pain, took a leftover gabapentin that seemed to help but he is not willing to continue taking gabapentin. Previously PT was ineffective.  Plan:. - Consider Tylenol for pain management despite potential interference with CGM. - Avoid ibuprofen or other NSAIDs due to CKD. - Consult with neurosurgeon Dr. Mavis if pain worsens. - Encourage weight loss to improve mobility and reduce back pain. - Continue Mounjaro  and discuss potential dose adjustment with Dr. Lugene. - Decline physical therapy for balance improvement. Type 1 diabetes mellitus (CKD, neuropathy) Type 1 diabetes managed with insulin  and Mounjaro . Recent initiation of Mounjaro  with minimal weight loss.  - Continue insulin  and Mounjaro . - Discuss potential Mounjaro  dose adjustment with Dr. Lugene. - Encourage dietary modifications to enhance weight loss. CKD: -Saw nephrology, 08/08/2023, GFR ranged from 30-44. Was rec to continue ARB's.    -Home magnesium OTC, recommend  to discuss with nephrology Hyperlipidemia - Continue Lipitor. Obstructive sleep apnea  managed with CPAP.  Reports good compliance Depression  Continue Paxil . Vaccines: Had a COVID-vaccine 3 weeks ago.  Flu shot today RTC 4 months        CKD:

## 2024-01-02 ENCOUNTER — Telehealth: Payer: Self-pay

## 2024-01-02 ENCOUNTER — Other Ambulatory Visit (HOSPITAL_COMMUNITY): Payer: Self-pay

## 2024-01-02 ENCOUNTER — Encounter: Payer: Self-pay | Admitting: Endocrinology

## 2024-01-02 NOTE — Telephone Encounter (Signed)
 Pharmacy Patient Advocate Encounter   Received notification from Patient Advice Request messages that prior authorization for Humalog  U200 is required/requested.   Insurance verification completed.   The patient is insured through [place].   Per test claim: Medication is not eligible for pharmacy benefits and must be billed through medical insurance. As our team only handles pharmacy related prior auths, medical PA's must be submitted by the clinic. Thank you

## 2024-01-03 ENCOUNTER — Other Ambulatory Visit: Payer: Self-pay

## 2024-01-03 DIAGNOSIS — E1065 Type 1 diabetes mellitus with hyperglycemia: Secondary | ICD-10-CM

## 2024-01-03 MED ORDER — HUMALOG KWIKPEN 200 UNIT/ML ~~LOC~~ SOPN: PEN_INJECTOR | SUBCUTANEOUS | 3 refills | Status: AC

## 2024-01-04 ENCOUNTER — Other Ambulatory Visit

## 2024-01-04 LAB — LIPID PANEL
Cholesterol: 121 mg/dL (ref <200)
HDL: 28 mg/dL — ABNORMAL LOW (ref >=40)
LDL Cholesterol (Calc): 61 mg/dL
Non-HDL Cholesterol (Calc): 93 mg/dL (ref <130)
Total CHOL/HDL Ratio: 4.3 (calc) (ref <5.0)
Triglycerides: 272 mg/dL — ABNORMAL HIGH (ref <150)

## 2024-01-04 LAB — BASIC METABOLIC PANEL WITH GFR
BUN/Creatinine Ratio: 19 (calc) (ref 6–22)
BUN: 28 mg/dL — ABNORMAL HIGH (ref 7–25)
CO2: 28 mmol/L (ref 20–32)
Calcium: 8.7 mg/dL (ref 8.6–10.3)
Chloride: 107 mmol/L (ref 98–110)
Creat: 1.48 mg/dL — ABNORMAL HIGH (ref 0.70–1.28)
Glucose, Bld: 159 mg/dL — ABNORMAL HIGH (ref 65–139)
Potassium: 4.7 mmol/L (ref 3.5–5.3)
Sodium: 141 mmol/L (ref 135–146)
eGFR: 49 mL/min/1.73m2 — ABNORMAL LOW (ref >=60)

## 2024-01-05 ENCOUNTER — Ambulatory Visit: Payer: Self-pay | Admitting: Endocrinology

## 2024-01-09 LAB — TESTOSTERONE, FREE & TOTAL
Free Testosterone: 64.9 pg/mL (ref 30.0–135.0)
Testosterone, Total, LC-MS-MS: 265 ng/dL (ref 250–1100)

## 2024-01-11 ENCOUNTER — Ambulatory Visit: Payer: Self-pay | Admitting: Endocrinology

## 2024-01-11 ENCOUNTER — Ambulatory Visit: Admitting: Endocrinology

## 2024-01-11 ENCOUNTER — Encounter: Payer: Self-pay | Admitting: Endocrinology

## 2024-01-11 VITALS — BP 132/50 | HR 65 | Resp 20 | Ht 67.0 in | Wt 303.6 lb

## 2024-01-11 DIAGNOSIS — E1065 Type 1 diabetes mellitus with hyperglycemia: Secondary | ICD-10-CM | POA: Diagnosis not present

## 2024-01-11 DIAGNOSIS — E291 Testicular hypofunction: Secondary | ICD-10-CM

## 2024-01-11 DIAGNOSIS — G4733 Obstructive sleep apnea (adult) (pediatric): Secondary | ICD-10-CM | POA: Diagnosis not present

## 2024-01-11 DIAGNOSIS — E7800 Pure hypercholesterolemia, unspecified: Secondary | ICD-10-CM

## 2024-01-11 DIAGNOSIS — E88819 Insulin resistance, unspecified: Secondary | ICD-10-CM

## 2024-01-11 LAB — POCT GLYCOSYLATED HEMOGLOBIN (HGB A1C): Hemoglobin A1C: 6.8 % — AB (ref 4.0–5.6)

## 2024-01-11 MED ORDER — TIRZEPATIDE 10 MG/0.5ML ~~LOC~~ SOAJ
10.0000 mg | SUBCUTANEOUS | 3 refills | Status: AC
Start: 2024-01-11 — End: ?

## 2024-01-11 NOTE — Progress Notes (Signed)
 Outpatient Endocrinology Note Paul Wester, MD  01/11/24  Patient's Name: Paul Adkins    DOB: [birth year]    MRN: [id number]                                                    REASON OF VISIT: Follow up of type 1 diabetes mellitus currie  PCP: Paul Aloysius BRAVO, MD  HISTORY OF PRESENT ILLNESS:   Paul Adkins is a 78 y.o. old male with past medical history listed below, is here for follow up of type 1 diabetes mellitus and hypogonadism.    Pertinent Diabetes History: Patient was previously seen by Dr. Von and was last time seen in August 2024. Patient was diagnosed with type 1 diabetes mellitus in 1978.  He has been on insulin  pump therapy.  Chronic Diabetes Complications : Retinopathy: yes. Last ophthalmology exam was done on annually, following with ophthalmology regularly, Dr. Will.   Nephropathy: CKD IIIb, on ACE/ARB / olmesartan , following with nephrology. Peripheral neuropathy: no Coronary artery disease: on Stroke: yes  Relevant comorbidities and cardiovascular risk factors: Obesity: yes Body mass index is 47.55 kg/m.  Hypertension: Yes  Hyperlipidemia : Yes, on statin.   Current / Home Diabetic regimen includes:  Mounjaro  7.5 mg weekly.  Mounjaro  was started in May 2025.  Also indicated for obstructive sleep apnea and insulin  resistance.  Tandem t:slim insulin  pump on control IQ with Dexcom G6.  Using Humalog  U200.  Insulin  Pump setting:  Basal ( total 35.1) MN- 1.40u/hour 4AM- 1.350  8AM- 1.450 10AM- 1.450 6PM-   1.60  Bolus CHO Ratio (1unit:CHO) MN- 1:7  Correction/Sensitivity: MN- 1:28  Target: 110  Active insulin  time: 5 hours  Prior diabetic medications: He had tried in invokana  in the past. Ozempic  in the past, later stopped not sure about side effect, patient does not want to go back on it.   Started on Humalog  U200 on the pump from May 2025.  CONTINUOUS GLUCOSE MONITORING SYSTEM (CGMS) / INSULIN  PUMP INTERPRETATION:                          Tandem Pump & Sensor Download (Reviewed and summarized below.) Pump: Dexcom G6 and Tandem t:slim Dates: J October 18 to November 14 , 2025, 14 days  Total daily dose 63,  Basal 5%, bolus 45%, manual bolus 53%, control IQ bolus 47%.  Average daily carbs 83 g. GMI 7.2%  Using Humalog  U200.      Trends:  Mostly acceptable blood sugar with occasional mild hyperglycemia with blood sugar in low 200 postprandially.  Blood sugar in between the meals and overnight acceptable.  No hypoglycemia.  Hypoglycemia: Patient has no hypoglycemic episodes. Patient has hypoglycemia awareness.    Factors modifying glucose control: 1.  Diabetic diet assessment: 3 meals a day.  2.  Staying active or exercising: No formal exercise.  3.  Medication compliance: compliant most of the time.  # Hypogonadism : - He has had hypogonadotropic hypogonadism, he has been on testosterone  supplementation since about 2007. Had been on AndroGel  1.62% pump.  Currently on Jatenzo  237 mg twice a day  Interval history  Recent laboratory results reviewed.  Stable renal function.  Cholesterol level acceptable with LDL 61.  Testosterone  level acceptable.  Hemoglobin A1c today 6.8%.  Pump and CGM  data as reviewed above.  He has been taking Mounjaro  7.5 mg weekly and tolerating well, denies any GI issues.  He lost about 5 pounds of weight since last visit in August.  He complains of pain on the bilateral legs in the thighs and legs and sometimes up to the ankle area and feet.  No numbness and tingling of the feet.  Discussed that is unlikely to be related to diabetic neuropathy.  He also has back issue and has been following with neurosurgery.  No other complaints today.  REVIEW OF SYSTEMS As per history of present illness.   PAST MEDICAL HISTORY: Past Medical History:  Diagnosis Date   Carpal tunnel syndrome    Chronic kidney disease    Depression    Diabetes mellitus without complication (HCC)    ED  (erectile dysfunction)    Edema    Hyperlipidemia    Hypertension    Lumbar disc disease    Neuropathy    Sleep apnea    Stroke (HCC)     PAST SURGICAL HISTORY: Past Surgical History:  Procedure Laterality Date   BACK SURGERY  2005   fusion L4-S1   VASECTOMY      ALLERGIES: No Known Allergies  FAMILY HISTORY:  Family History  Problem Relation Age of Onset   Osteoarthritis Mother    Cancer Father        Prostate   Prostate cancer Brother    Drug abuse Brother    Diabetes Neg Hx    Colon cancer Neg Hx     SOCIAL HISTORY: Social History   Socioeconomic History   Marital status: Single    Spouse name: Not on file   Number of children: 0   Years of education: Not on file   Highest education level: Doctorate  Occupational History   Occupation: retired, music professor [employer]    Comment: University Professor, Music  Tobacco Use   Smoking status: Never    Passive exposure: Never   Smokeless tobacco: Never  Vaping Use   Vaping status: Never Used  Substance and Sexual Activity   Alcohol use: No   Drug use: No   Sexual activity: Not on file  Other Topics Concern   Not on file  Social History Narrative   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 [employer] Strain: Low Risk  (12/21/2023)   Overall Financial Resource Strain (CARDIA)    Difficulty of Paying Living Expenses: Not very hard  Food Insecurity: No Food Insecurity (12/21/2023)   Hunger Vital Sign    Worried About Running Out of Food in the Last Year: Never true    Ran Out of Food in the Last Year: Never true  Transportation Needs: No Transportation Needs (12/21/2023)   PRAPARE - Administrator, Civil Service (Medical): No    Lack of Transportation (Non-Medical): No  Physical Activity: Inactive (12/21/2023)   Exercise Vital Sign    Days of Exercise per Week: 0 days    Minutes of Exercise per Session: Not on file  Stress: No Stress  Concern Present (12/21/2023)   [employer] of Occupational Health - Occupational Stress Questionnaire    Feeling of Stress: Not at all  Social Connections: Moderately Isolated (12/21/2023)   Social Connection and Isolation Panel    Frequency of Communication with Friends and Family: Twice a week    Frequency of Social Gatherings with Friends and Family: Once a  week    Attends Religious Services: Never    Active Member of Clubs or Organizations: Yes    Attends Engineer, Structural: More than 4 times per year    Marital Status: Divorced    MEDICATIONS:  Current Outpatient Medications  Medication Sig Dispense Refill   atorvastatin  (LIPITOR) 20 MG tablet TAKE 1 TABLET BY MOUTH EVERY DAY 90 tablet 1   carvedilol (COREG) 6.25 MG tablet Take 6.25 mg by mouth 2 (two) times daily with a meal.     furosemide  (LASIX ) 40 MG tablet TAKE 1 TABLET BY MOUTH TWICE A DAY AS NEEDED 180 tablet 0   glucose blood (BAYER CONTOUR NEXT TEST) test strip Use as instructed to check blood sugars 4 times per day dx code E10.65 150 each 5   Insulin  Infusion Pump (T:SLIM X2 CONTROL-IQ 7.8 PUMP) DEVI by Does not apply route.     insulin  lispro (HUMALOG  KWIKPEN) 200 UNIT/ML KwikPen Take up to 180 units/ day via insulin  pump. 84 mL 3   magnesium oxide (MAG-OX) 400 MG tablet Take 1 tablet (400 mg total) by mouth daily.     Multiple Vitamins-Minerals (EYE HEALTH AREDS 2 PO) Take 2 capsules by mouth daily.     olmesartan  (BENICAR ) 40 MG tablet TAKE 1 TABLET BY MOUTH EVERY DAY 90 tablet 2   PARoxetine  (PAXIL ) 40 MG tablet Take 1 tablet (40 mg total) by mouth daily. 90 tablet 3   Polyethylene Glycol 3350 (MIRALAX PO) Take by mouth every other day.     spironolactone (ALDACTONE) 50 MG tablet Take 50 mg by mouth daily.     Testosterone  Undecanoate (JATENZO ) 237 MG CAPS 1 CAPSULE WITH BREAKFAST AND 1 WITH DINNER 60 capsule 5   tirzepatide  (MOUNJARO ) 10 MG/0.5ML Pen Inject 10 mg into the skin once a week. 6 mL 3    VITAMIN D , CHOLECALCIFEROL, PO Take 6,000 Int'l Units/day by mouth.     No current facility-administered medications for this visit.    PHYSICAL EXAM: Vitals:   01/11/24 0850  BP: (!) 132/50  Pulse: 65  Resp: 20  SpO2: 97%  Weight: (!) 303 lb 9.6 oz (137.7 kg)  Height: 5' 7 (1.702 m)     Body mass index is 47.55 kg/m.  Wt Readings from Last 3 Encounters:  01/11/24 (!) 303 lb 9.6 oz (137.7 kg)  12/28/23 (!) 301 lb 4 oz (136.6 kg)  10/10/23 (!) 308 lb (139.7 kg)    General: Well developed, well nourished male in no apparent distress.  HEENT: AT/[place], no external lesions.  Eyes: Conjunctiva clear and no icterus. Neck: Neck supple  Lungs: Respirations not labored Neurologic: Alert, oriented, normal speech Extremities / Skin: Dry.  Psychiatric: Does not appear depressed or anxious  Diabetic Foot Exam - Simple   No data filed     LABS Reviewed Lab Results  Component Value Date   HGBA1C 6.8 (A) 01/11/2024   HGBA1C 7.1 (H) 10/05/2023   HGBA1C 7.6 (H) 06/29/2023   Lab Results  Component Value Date   FRUCTOSAMINE 219 09/25/2022   FRUCTOSAMINE 256 08/02/2015   Lab Results  Component Value Date   CHOL 121 01/04/2024   HDL 28 (L) 01/04/2024   LDLCALC 61 01/04/2024   LDLDIRECT 67.0 08/02/2015   TRIG 272 (H) 01/04/2024   CHOLHDL 4.3 01/04/2024   Lab Results  Component Value Date   MICRALBCREAT 6 10/05/2023   MICRALBCREAT 6 08/09/2023   Lab Results  Component Value Date   CREATININE 1.48 (H)  01/04/2024   Lab Results  Component Value Date   GFR 40.50 (L) 12/25/2022    ASSESSMENT / PLAN  1. Uncontrolled type 1 diabetes mellitus with hyperglycemia (HCC)   2. Morbid obesity (HCC)   3. Obstructive sleep apnea   4. Hypogonadism male   5. Insulin  resistance   6. Hypercholesterolemia      Diabetes Mellitus type 1, complicated by diabetic retinopathy/CKD. - Diabetic status / severity: Uncontrolled.  Improving.  Lab Results  Component Value Date   HGBA1C  6.8 (A) 01/11/2024   Patient has high insulin  resistance due to obesity.  - Hemoglobin A1c goal <6.5%   Patient has high insulin  resistance, requiring insulin  close to 1 unit /kg body weight.  He will benefit from GLP-1 receptor agonist to help with insulin  sensitivity and also promote weight loss.  This will help to require less amount of insulin .  Mounjaro  was started in May 2025 followed with indication of obstructive sleep apnea and obesity.  It was covered and was able to get it.  He has required less insulin  and also diabetes control has improved after being on Mounjaro .  - Medications:  Insulin  pump setting changed as follows:  Tandem t:slim insulin  pump on control IQ with Dexcom G6.  Using Humalog  U200.  No change on the pump setting today.  He required high dose of insulin  on the insulin  pump, I would prefer to use U200 insulin  and then U100 insulin .  Increase Mounjaro  from 7.5 to 10 mg weekly.  - Home glucose testing: continue CGM and check blood glucose as needed.   - Discussed/ Gave Hypoglycemia treatment plan.  # Consult : not required at this time.   # Annual urine for microalbuminuria/ creatinine ratio, no microalbuminuria currently, continue ACE/ARB /olmesartan  / he has CKD, following with nephrology. Last  Lab Results  Component Value Date   MICRALBCREAT 6 10/05/2023    # Foot check nightly.  # Annual dilated diabetic eye exams.   - Diet: Make healthy diabetic food choices - Life style / activity / exercise: Discussed.  Not able to exercise.  2. Blood pressure  -  BP Readings from Last 1 Encounters:  01/11/24 (!) 132/50    - Control is on target.  - No change in current plans.  3. Lipid status / Hyperlipidemia - Last  Lab Results  Component Value Date   LDLCALC 61 01/04/2024   - Continue atorvastatin  20 mg daily.    # Hypogonadism: -Continue Jatenzo  237 mg 2 times a day with meals.  Recent testosterone  level acceptable.  Diagnoses and all  orders for this visit:  Uncontrolled type 1 diabetes mellitus with hyperglycemia (HCC) -     POCT glycosylated hemoglobin (Hb A1C)  Morbid obesity (HCC) -     tirzepatide  (MOUNJARO ) 10 MG/0.5ML Pen; Inject 10 mg into the skin once a week.  Obstructive sleep apnea -     tirzepatide  (MOUNJARO ) 10 MG/0.5ML Pen; Inject 10 mg into the skin once a week.  Hypogonadism male  Insulin  resistance  Hypercholesterolemia    DISPOSITION Follow up in clinic in 4 months suggested.     All questions answered and patient verbalized understanding of the plan.  Nailani Full, MD [facility] Endocrinology [facility] [address] [place], [address] [place], [place] [address] Phone # [phone]  At least part of this note was generated using voice recognition software. Inadvertent word errors may have occurred, which were not recognized during the proofreading process.

## 2024-01-15 ENCOUNTER — Other Ambulatory Visit: Payer: Self-pay | Admitting: Endocrinology

## 2024-02-01 LAB — MICROALBUMIN / CREATININE URINE RATIO: Microalb Creat Ratio: 10

## 2024-02-01 LAB — PROTEIN / CREATININE RATIO, URINE
Albumin, U: 16
Creatinine, Urine: 167.5

## 2024-02-11 ENCOUNTER — Encounter: Payer: Self-pay | Admitting: Internal Medicine

## 2024-02-14 ENCOUNTER — Other Ambulatory Visit: Payer: Self-pay | Admitting: Neurosurgery

## 2024-02-14 DIAGNOSIS — M48062 Spinal stenosis, lumbar region with neurogenic claudication: Secondary | ICD-10-CM

## 2024-02-25 ENCOUNTER — Other Ambulatory Visit: Payer: Self-pay | Admitting: Internal Medicine

## 2024-02-25 MED ORDER — PAROXETINE HCL 40 MG PO TABS: 40.0000 mg | ORAL_TABLET | Freq: Every day | ORAL | 1 refills | Status: AC

## 2024-02-25 MED ORDER — FUROSEMIDE 40 MG PO TABS: 40.0000 mg | ORAL_TABLET | Freq: Two times a day (BID) | ORAL | 0 refills | Status: AC | PRN

## 2024-02-25 MED ORDER — OLMESARTAN MEDOXOMIL 40 MG PO TABS: 40.0000 mg | ORAL_TABLET | Freq: Every day | ORAL | 1 refills | Status: AC

## 2024-02-25 NOTE — Telephone Encounter (Signed)
 Copied from CRM [id number]. Topic: Clinical - Medication Refill >> Feb 25, 2024  4:25 PM Leah W wrote: Medication: PARoxetine  (PAXIL ) 40 MG tablet *pt ran out of this rx yesterday and is asking for this to sent over as soon as possible Has the patient contacted their pharmacy? Yes (Agent: If no, request that the patient contact the pharmacy for the refill. If patient does not wish to contact the pharmacy document the reason why and proceed with request.) (Agent: If yes, when and what did the pharmacy advise?)  This is the patient's preferred pharmacy:  [id number] [place], [place] - [address] [place] [address] Phone: [phone] Fax: [phone]  Is this the correct pharmacy for this prescription? Yes If no, delete pharmacy and type the correct one.   Has the prescription been filled recently? No  Is the patient out of the medication? Yes  Has the patient been seen for an appointment in the last year OR does the patient have an upcoming appointment? Yes  Can we respond through MyChart? No  Agent: Please be advised that Rx refills may take up to 3 business days. We ask that you follow-up with your pharmacy.

## 2024-02-27 NOTE — Discharge Instructions (Signed)

## 2024-02-29 ENCOUNTER — Inpatient Hospital Stay
Admission: RE | Admit: 2024-02-29 | Discharge: 2024-02-29 | Disposition: A | Discharge status: Home / Self Care | Source: Ambulatory Visit | Attending: Neurosurgery | Admitting: Neurosurgery

## 2024-02-29 ENCOUNTER — Other Ambulatory Visit

## 2024-03-03 ENCOUNTER — Encounter (INDEPENDENT_AMBULATORY_CARE_PROVIDER_SITE_OTHER): Admitting: Ophthalmology

## 2024-03-10 ENCOUNTER — Encounter: Payer: Self-pay | Admitting: Neurosurgery

## 2024-03-19 ENCOUNTER — Encounter: Payer: Self-pay | Admitting: Neurosurgery

## 2024-05-02 ENCOUNTER — Ambulatory Visit: Admitting: Internal Medicine

## 2024-05-05 ENCOUNTER — Encounter (INDEPENDENT_AMBULATORY_CARE_PROVIDER_SITE_OTHER): Admitting: Ophthalmology

## 2024-05-12 ENCOUNTER — Ambulatory Visit: Admitting: Endocrinology

## 2024-09-11 ENCOUNTER — Ambulatory Visit
